# Patient Record
Sex: Female | Born: 1985 | Race: White | Hispanic: No | Marital: Married
Health system: Midwestern US, Community
[De-identification: ages and names within clinical notes are randomized; demographics above are authoritative.]

## PROBLEM LIST (undated history)

## (undated) DIAGNOSIS — N83209 Unspecified ovarian cyst, unspecified side: Secondary | ICD-10-CM

## (undated) DIAGNOSIS — M797 Fibromyalgia: Secondary | ICD-10-CM

## (undated) DIAGNOSIS — T753XXA Motion sickness, initial encounter: Secondary | ICD-10-CM

## (undated) DIAGNOSIS — K562 Volvulus: Secondary | ICD-10-CM

## (undated) DIAGNOSIS — K56609 Unspecified intestinal obstruction, unspecified as to partial versus complete obstruction: Secondary | ICD-10-CM

## (undated) DIAGNOSIS — K589 Irritable bowel syndrome without diarrhea: Secondary | ICD-10-CM

## (undated) DIAGNOSIS — G629 Polyneuropathy, unspecified: Secondary | ICD-10-CM

## (undated) DIAGNOSIS — R011 Cardiac murmur, unspecified: Secondary | ICD-10-CM

## (undated) DIAGNOSIS — F329 Major depressive disorder, single episode, unspecified: Secondary | ICD-10-CM

## (undated) DIAGNOSIS — F32A Depression, unspecified: Secondary | ICD-10-CM

## (undated) DIAGNOSIS — N200 Calculus of kidney: Secondary | ICD-10-CM

## (undated) DIAGNOSIS — G43909 Migraine, unspecified, not intractable, without status migrainosus: Secondary | ICD-10-CM

## (undated) DIAGNOSIS — K8020 Calculus of gallbladder without cholecystitis without obstruction: Secondary | ICD-10-CM

## (undated) DIAGNOSIS — F419 Anxiety disorder, unspecified: Secondary | ICD-10-CM

## (undated) HISTORY — DX: Anxiety disorder, unspecified: F41.9

## (undated) HISTORY — DX: Irritable bowel syndrome without diarrhea: K58.9

## (undated) HISTORY — DX: Polyneuropathy, unspecified: G62.9

## (undated) HISTORY — PX: SMALL INTESTINE SURGERY: SHX150

## (undated) HISTORY — DX: Fibromyalgia: M79.7

## (undated) HISTORY — DX: Calculus of gallbladder without cholecystitis without obstruction: K80.20

## (undated) HISTORY — DX: Migraine, unspecified, not intractable, without status migrainosus: G43.909

## (undated) HISTORY — DX: Depression, unspecified: F32.A

## (undated) HISTORY — DX: Volvulus: K56.2

## (undated) HISTORY — PX: OVARIAN CYST SURGERY: SHX726

## (undated) HISTORY — PX: KIDNEY STONE SURGERY: SHX686

## (undated) HISTORY — DX: Major depressive disorder, single episode, unspecified: F32.9

## (undated) HISTORY — PX: ABDOMINAL SURGERY: SHX537

---

## 2005-09-01 ENCOUNTER — Observation Stay: Payer: Self-pay | Admitting: Urology

## 2008-09-06 ENCOUNTER — Emergency Department: Payer: Self-pay | Admitting: Emergency Medicine

## 2008-09-24 DIAGNOSIS — N83209 Unspecified ovarian cyst, unspecified side: Secondary | ICD-10-CM

## 2008-09-24 HISTORY — DX: Unspecified ovarian cyst, unspecified side: N83.209

## 2008-12-29 ENCOUNTER — Ambulatory Visit: Payer: Self-pay | Admitting: Internal Medicine

## 2009-09-12 ENCOUNTER — Emergency Department: Payer: Self-pay | Admitting: Emergency Medicine

## 2012-03-01 ENCOUNTER — Ambulatory Visit: Payer: Self-pay | Admitting: Medical

## 2016-03-29 ENCOUNTER — Emergency Department (HOSPITAL_COMMUNITY): Payer: Medicaid Other

## 2016-03-29 ENCOUNTER — Encounter (HOSPITAL_COMMUNITY): Payer: Self-pay | Admitting: Emergency Medicine

## 2016-03-29 ENCOUNTER — Emergency Department (HOSPITAL_COMMUNITY)
Admission: EM | Admit: 2016-03-29 | Discharge: 2016-03-29 | Disposition: A | Discharge status: Home / Self Care | Payer: Medicaid Other | Attending: Emergency Medicine | Admitting: Emergency Medicine

## 2016-03-29 DIAGNOSIS — R109 Unspecified abdominal pain: Secondary | ICD-10-CM | POA: Diagnosis present

## 2016-03-29 DIAGNOSIS — R112 Nausea with vomiting, unspecified: Secondary | ICD-10-CM | POA: Diagnosis not present

## 2016-03-29 DIAGNOSIS — R1084 Generalized abdominal pain: Secondary | ICD-10-CM

## 2016-03-29 HISTORY — DX: Unspecified intestinal obstruction, unspecified as to partial versus complete obstruction: K56.609

## 2016-03-29 HISTORY — DX: Unspecified ovarian cyst, unspecified side: N83.209

## 2016-03-29 HISTORY — DX: Calculus of kidney: N20.0

## 2016-03-29 HISTORY — DX: Cardiac murmur, unspecified: R01.1

## 2016-03-29 LAB — URINALYSIS, ROUTINE W REFLEX MICROSCOPIC
BILIRUBIN URINE: NEGATIVE
Glucose, UA: NEGATIVE mg/dL
Ketones, ur: NEGATIVE mg/dL
LEUKOCYTES UA: NEGATIVE
NITRITE: NEGATIVE
PH: 6 (ref 5.0–8.0)
Protein, ur: NEGATIVE mg/dL
SPECIFIC GRAVITY, URINE: 1.02 (ref 1.005–1.030)

## 2016-03-29 LAB — BASIC METABOLIC PANEL
ANION GAP: 10 (ref 5–15)
BUN: 5 mg/dL — ABNORMAL LOW (ref 6–20)
CALCIUM: 9 mg/dL (ref 8.9–10.3)
CO2: 24 mmol/L (ref 22–32)
Chloride: 102 mmol/L (ref 101–111)
Creatinine, Ser: 0.56 mg/dL (ref 0.44–1.00)
GFR calc Af Amer: >60 mL/min (ref >60)
Glucose, Bld: 114 mg/dL — ABNORMAL HIGH (ref 65–99)
POTASSIUM: 3.4 mmol/L — AB (ref 3.5–5.1)
SODIUM: 136 mmol/L (ref 135–145)

## 2016-03-29 LAB — CBC WITH DIFFERENTIAL/PLATELET
BASOS ABS: 0 10*3/uL (ref 0.0–0.1)
BASOS PCT: 0 %
EOS PCT: 2 %
Eosinophils Absolute: 0.1 10*3/uL (ref 0.0–0.7)
HEMATOCRIT: 37 % (ref 36.0–46.0)
Hemoglobin: 12.6 g/dL (ref 12.0–15.0)
LYMPHS PCT: 32 %
Lymphs Abs: 2.2 10*3/uL (ref 0.7–4.0)
MCH: 29.6 pg (ref 26.0–34.0)
MCHC: 34.1 g/dL (ref 30.0–36.0)
MCV: 86.9 fL (ref 78.0–100.0)
Monocytes Absolute: 0.4 10*3/uL (ref 0.1–1.0)
Monocytes Relative: 5 %
NEUTROS ABS: 4.2 10*3/uL (ref 1.7–7.7)
Neutrophils Relative %: 61 %
PLATELETS: 289 10*3/uL (ref 150–400)
RBC: 4.26 MIL/uL (ref 3.87–5.11)
RDW: 12.9 % (ref 11.5–15.5)
WBC: 6.9 10*3/uL (ref 4.0–10.5)

## 2016-03-29 LAB — PREGNANCY, URINE: Preg Test, Ur: NEGATIVE

## 2016-03-29 LAB — URINE MICROSCOPIC-ADD ON

## 2016-03-29 MED ORDER — SODIUM CHLORIDE 0.9 % IV BOLUS (SEPSIS)
1000.0000 mL | Freq: Once | INTRAVENOUS | Status: AC
  Administered 2016-03-29: 1000 mL via INTRAVENOUS

## 2016-03-29 MED ORDER — PROMETHAZINE HCL 25 MG RE SUPP: 25.0000 mg | Freq: Four times a day (QID) | RECTAL | Status: DC | PRN

## 2016-03-29 MED ORDER — PROMETHAZINE HCL 25 MG/ML IJ SOLN
12.5000 mg | Freq: Once | INTRAMUSCULAR | Status: AC
  Administered 2016-03-29: 12.5 mg via INTRAVENOUS
  Filled 2016-03-29: qty 1

## 2016-03-29 MED ORDER — PROMETHAZINE HCL 25 MG PO TABS: 25.0000 mg | ORAL_TABLET | Freq: Four times a day (QID) | ORAL | Status: DC | PRN

## 2016-03-29 MED ORDER — ONDANSETRON HCL 4 MG/2ML IJ SOLN
4.0000 mg | Freq: Once | INTRAMUSCULAR | Status: AC
  Administered 2016-03-29: 4 mg via INTRAVENOUS
  Filled 2016-03-29: qty 2

## 2016-03-29 MED ORDER — HYDROMORPHONE HCL 1 MG/ML IJ SOLN
1.0000 mg | Freq: Once | INTRAMUSCULAR | Status: AC
  Administered 2016-03-29: 1 mg via INTRAVENOUS
  Filled 2016-03-29: qty 1

## 2016-03-29 MED ORDER — DICYCLOMINE HCL 20 MG PO TABS: 20.0000 mg | ORAL_TABLET | Freq: Two times a day (BID) | ORAL | Status: DC

## 2016-03-29 NOTE — ED Notes (Signed)
Patient arrives with complaint of right flank pain. States onset over weekend. Was seen in [place] for the same, and was told she likely had kidney stones. Today pain has been very intense. Patient doubled over crying out in pain in triage. Endorses history of SBO with surgical intervention x2 and ovarian cysts in addition to Kidney stone history.

## 2016-03-29 NOTE — ED Notes (Signed)
Pt. Vomiting. EDP made aware.

## 2016-03-29 NOTE — ED Provider Notes (Signed)
CSN: [id number]     Arrival date & time 03/29/16  1859 History   First MD Initiated Contact with Patient 03/29/16 1940     Chief Complaint  Patient presents with  . Flank Pain   Pt is a 30 yo female with a hx of chronic abdominal pain who presents to the ED today with right sided flank pain.  The pt normally goes to the [facility] ED and was seen there on July 3rd.  She had some blood in her urine so it was a presumed kidney stone.  The pt has had many CT scans and other imaging, so that is probably the reason it was not explored further.  Pt's last CT scan was in June and showed moderate amt of stool only.  Pt reports severe right sided flank pain that has been continuing since she was seen in the ED in [place].    (Consider location/radiation/quality/duration/timing/severity/associated sxs/prior Treatment) Patient is a 30 y.o. female presenting with flank pain. The history is provided by the patient.  Flank Pain This is a recurrent problem. The current episode started more than 2 days ago. The problem occurs constantly. The problem has been rapidly worsening. Associated symptoms include abdominal pain. Nothing aggravates the symptoms. Nothing relieves the symptoms.    Past Medical History  Diagnosis Date  . SBO (small bowel obstruction) (HCC)   . Ovarian cyst   . Kidney stones   . Heart murmur    Past Surgical History  Procedure Laterality Date  . Abdominal surgery     History reviewed. No pertinent family history. Social History  Substance Use Topics  . Smoking status: Never Smoker   . Smokeless tobacco: None  . Alcohol Use: No   OB History    No data available     Review of Systems  Gastrointestinal: Positive for abdominal pain.  Genitourinary: Positive for flank pain.  All other systems reviewed and are negative.     Allergies  Morphine and related  Home Medications   Prior to Admission medications   Medication Sig Start Date End Date Taking? Authorizing  Provider  Cholecalciferol (VITAMIN D PO) Take 1 tablet by mouth daily.   Yes Historical Provider, MD  DULoxetine (CYMBALTA) 60 MG capsule Take 60 mg by mouth daily.   Yes Historical Provider, MD  HYDROcodone-acetaminophen (NORCO/VICODIN) 5-325 MG tablet Take 1 tablet by mouth every 6 (six) hours as needed for moderate pain.   Yes Historical Provider, MD  lubiprostone (AMITIZA) 24 MCG capsule Take 24 mcg by mouth 2 (two) times daily with a meal.   Yes Historical Provider, MD  norgestimate-ethinyl estradiol (ORTHO-CYCLEN,SPRINTEC,PREVIFEM) 0.25-35 MG-MCG tablet Take 1 tablet by mouth daily.   Yes Historical Provider, MD  omeprazole (PRILOSEC) 20 MG capsule Take 20 mg by mouth daily.   Yes Historical Provider, MD  polyethylene glycol (MIRALAX / GLYCOLAX) packet Take 17 g by mouth daily as needed for mild constipation.   Yes Historical Provider, MD  tamsulosin (FLOMAX) 0.4 MG CAPS capsule Take 0.4 mg by mouth daily. Take for 7 days   Yes Historical Provider, MD  dicyclomine (BENTYL) 20 MG tablet Take 1 tablet (20 mg total) by mouth 2 (two) times daily. 03/29/16   Jacalyn LefevreJulie Leeandra Ellerson, MD  promethazine (PHENERGAN) 25 MG suppository Place 1 suppository (25 mg total) rectally every 6 (six) hours as needed for nausea or vomiting. 03/29/16   Jacalyn LefevreJulie Kailin Leu, MD  promethazine (PHENERGAN) 25 MG tablet Take 1 tablet (25 mg total) by mouth every 6 (  six) hours as needed for nausea or vomiting. 03/29/16   Jacalyn LefevreJulie Passion Lavin, MD   BP 105/75 mmHg  Pulse 84  Temp(Src) 98.2 F (36.8 C) (Oral)  Resp 13  Ht 4\' 11"  (1.499 m)  Wt 93 lb (42.185 kg)  BMI 18.77 kg/m2  SpO2 100%  LMP 03/08/2016 (Exact Date) Physical Exam  Constitutional: She is oriented to person, place, and time. She appears well-developed and well-nourished. She appears distressed.  HENT:  Head: Normocephalic and atraumatic.  Right Ear: External ear normal.  Left Ear: External ear normal.  Mouth/Throat: Oropharynx is clear and moist.  Eyes: Conjunctivae and EOM  are normal. Pupils are equal, round, and reactive to light.  Neck: Normal range of motion. Neck supple.  Cardiovascular: Regular rhythm, normal heart sounds and intact distal pulses.  Tachycardia present.   Pulmonary/Chest: Effort normal and breath sounds normal.  Abdominal: Soft. Bowel sounds are normal. There is tenderness in the right lower quadrant.  Musculoskeletal: Normal range of motion.  Neurological: She is alert and oriented to person, place, and time.  Skin: Skin is warm and dry.  Psychiatric: Her behavior is normal. Judgment and thought content normal. Her mood appears anxious.  Nursing note and vitals reviewed.   ED Course  Procedures (including critical care time) Labs Review Labs Reviewed  URINALYSIS, ROUTINE W REFLEX MICROSCOPIC (NOT AT [facility]) - Abnormal; Notable for the following:    APPearance CLOUDY (*)    Hgb urine dipstick MODERATE (*)    All other components within normal limits  BASIC METABOLIC PANEL - Abnormal; Notable for the following:    Potassium 3.4 (*)    Glucose, Bld 114 (*)    BUN 5 (*)    All other components within normal limits  URINE MICROSCOPIC-ADD ON - Abnormal; Notable for the following:    Squamous Epithelial / LPF 0-5 (*)    Bacteria, UA MANY (*)    All other components within normal limits  CBC WITH DIFFERENTIAL/PLATELET  PREGNANCY, URINE    Imaging Review Ct Renal Stone Study  03/29/2016  CLINICAL DATA:  Right flank pain.  Started over the weekend. EXAM: CT ABDOMEN AND PELVIS WITHOUT CONTRAST TECHNIQUE: Multidetector CT imaging of the abdomen and pelvis was performed following the standard protocol without IV contrast. COMPARISON:  None. FINDINGS: Lower chest:  No acute findings. Hepatobiliary: No mass visualized on this un-enhanced exam. Pancreas: No mass or inflammatory process identified on this un-enhanced exam. Spleen: Within normal limits in size. Adrenals/Urinary Tract: No evidence of urolithiasis or hydronephrosis. 12 mm hypodense,  fluid attenuating left upper pole renal mass most consistent with a cyst. No definite solid mass visualized on this un-enhanced exam. Stomach/Bowel: No evidence of obstruction, inflammatory process, or abnormal fluid collections. Vascular/Lymphatic: No pathologically enlarged lymph nodes. No evidence of abdominal aortic aneurysm. Reproductive: No mass or other significant abnormality. Other: None. Musculoskeletal:  No suspicious bone lesions identified. IMPRESSION: 1. No urolithiasis or obstructive uropathy. 2. No bowel obstruction. Electronically Signed   By: Elige KoHetal  Patel   On: 03/29/2016 21:33   I have personally reviewed and evaluated these images and lab results as part of my medical decision-making.   EKG Interpretation None      MDM  Pt is feeling much better.  She is given a rx for phenergan oral and supp.  She is given the number to GI.  She knows to return if worse. Final diagnoses:  Generalized abdominal pain  Non-intractable vomiting with nausea, vomiting of unspecified type  Jacalyn Lefevre, MD 03/29/16 2155

## 2016-03-29 NOTE — Discharge Instructions (Signed)

## 2016-03-29 NOTE — ED Notes (Signed)
Pt taken back to triage, severe pain

## 2016-04-02 ENCOUNTER — Emergency Department (HOSPITAL_COMMUNITY): Payer: Medicaid Other

## 2016-04-02 ENCOUNTER — Emergency Department (HOSPITAL_COMMUNITY)
Admission: EM | Admit: 2016-04-02 | Discharge: 2016-04-02 | Disposition: A | Discharge status: Home / Self Care | Payer: Medicaid Other | Attending: Physician Assistant | Admitting: Physician Assistant

## 2016-04-02 ENCOUNTER — Encounter (HOSPITAL_COMMUNITY): Payer: Self-pay | Admitting: Family Medicine

## 2016-04-02 DIAGNOSIS — Z79899 Other long term (current) drug therapy: Secondary | ICD-10-CM | POA: Diagnosis not present

## 2016-04-02 DIAGNOSIS — R109 Unspecified abdominal pain: Secondary | ICD-10-CM | POA: Diagnosis not present

## 2016-04-02 DIAGNOSIS — R102 Pelvic and perineal pain: Secondary | ICD-10-CM | POA: Diagnosis not present

## 2016-04-02 DIAGNOSIS — M549 Dorsalgia, unspecified: Secondary | ICD-10-CM | POA: Insufficient documentation

## 2016-04-02 DIAGNOSIS — R519 Headache, unspecified: Secondary | ICD-10-CM

## 2016-04-02 DIAGNOSIS — R51 Headache: Secondary | ICD-10-CM | POA: Diagnosis not present

## 2016-04-02 LAB — URINE MICROSCOPIC-ADD ON

## 2016-04-02 LAB — CBC
HCT: 40.4 % (ref 36.0–46.0)
Hemoglobin: 13.5 g/dL (ref 12.0–15.0)
MCH: 29.6 pg (ref 26.0–34.0)
MCHC: 33.4 g/dL (ref 30.0–36.0)
MCV: 88.6 fL (ref 78.0–100.0)
PLATELETS: 327 10*3/uL (ref 150–400)
RBC: 4.56 MIL/uL (ref 3.87–5.11)
RDW: 12.9 % (ref 11.5–15.5)
WBC: 7.6 10*3/uL (ref 4.0–10.5)

## 2016-04-02 LAB — COMPREHENSIVE METABOLIC PANEL
ALK PHOS: 37 U/L — AB (ref 38–126)
ALT: 12 U/L — AB (ref 14–54)
AST: 19 U/L (ref 15–41)
Albumin: 3.9 g/dL (ref 3.5–5.0)
Anion gap: 7 (ref 5–15)
BUN: 9 mg/dL (ref 6–20)
CALCIUM: 9.4 mg/dL (ref 8.9–10.3)
CHLORIDE: 104 mmol/L (ref 101–111)
CO2: 27 mmol/L (ref 22–32)
CREATININE: 0.64 mg/dL (ref 0.44–1.00)
GFR calc Af Amer: >60 mL/min (ref >60)
Glucose, Bld: 114 mg/dL — ABNORMAL HIGH (ref 65–99)
Potassium: 3.7 mmol/L (ref 3.5–5.1)
Sodium: 138 mmol/L (ref 135–145)
Total Bilirubin: 0.6 mg/dL (ref 0.3–1.2)
Total Protein: 7.4 g/dL (ref 6.5–8.1)

## 2016-04-02 LAB — URINALYSIS, ROUTINE W REFLEX MICROSCOPIC
Bilirubin Urine: NEGATIVE
GLUCOSE, UA: NEGATIVE mg/dL
KETONES UR: 15 mg/dL — AB
Leukocytes, UA: NEGATIVE
Nitrite: NEGATIVE
PROTEIN: NEGATIVE mg/dL
Specific Gravity, Urine: 1.022 (ref 1.005–1.030)
pH: 5.5 (ref 5.0–8.0)

## 2016-04-02 LAB — I-STAT BETA HCG BLOOD, ED (MC, WL, AP ONLY): I-stat hCG, quantitative: <5 m[IU]/mL (ref <5)

## 2016-04-02 LAB — LIPASE, BLOOD: LIPASE: 39 U/L (ref 11–51)

## 2016-04-02 MED ORDER — SODIUM CHLORIDE 0.9 % IV BOLUS (SEPSIS)
1000.0000 mL | Freq: Once | INTRAVENOUS | Status: AC
  Administered 2016-04-02: 1000 mL via INTRAVENOUS

## 2016-04-02 MED ORDER — PROCHLORPERAZINE EDISYLATE 5 MG/ML IJ SOLN
10.0000 mg | Freq: Once | INTRAMUSCULAR | Status: AC
  Administered 2016-04-02: 10 mg via INTRAVENOUS
  Filled 2016-04-02: qty 2

## 2016-04-02 MED ORDER — FENTANYL CITRATE (PF) 100 MCG/2ML IJ SOLN
INTRAMUSCULAR | Status: AC
  Filled 2016-04-02: qty 2

## 2016-04-02 MED ORDER — DIPHENHYDRAMINE HCL 50 MG/ML IJ SOLN
25.0000 mg | Freq: Once | INTRAMUSCULAR | Status: AC
  Administered 2016-04-02: 25 mg via INTRAVENOUS
  Filled 2016-04-02: qty 1

## 2016-04-02 MED ORDER — ONDANSETRON 4 MG PO TBDP: 4.0000 mg | ORAL_TABLET | Freq: Once | ORAL | Status: DC | PRN

## 2016-04-02 MED ORDER — ONDANSETRON HCL 4 MG/2ML IJ SOLN
4.0000 mg | Freq: Once | INTRAMUSCULAR | Status: AC
  Administered 2016-04-02: 4 mg via INTRAVENOUS

## 2016-04-02 MED ORDER — ONDANSETRON HCL 4 MG/2ML IJ SOLN
INTRAMUSCULAR | Status: AC
  Filled 2016-04-02: qty 2

## 2016-04-02 MED ORDER — FENTANYL CITRATE (PF) 100 MCG/2ML IJ SOLN
50.0000 ug | INTRAMUSCULAR | Status: DC | PRN
  Administered 2016-04-02: 50 ug via INTRAVENOUS

## 2016-04-02 NOTE — ED Provider Notes (Signed)
CSN: [id number]     Arrival date & time 04/02/16  1649 History   First MD Initiated Contact with Patient 04/02/16 1849     Chief Complaint  Patient presents with  . Headache  . Back Pain  . Pelvic Pain     (Consider location/radiation/quality/duration/timing/severity/associated sxs/prior Treatment) HPI   Patient is a 30 year old female with history of chronic abdominal pain who is received multiple CTs at [facility] ED. She has been seen recently for right flank pain by her primary care physician and in the emergency department.  Radiology has been found. Patient reports today she is having the same pain. Additionally she's been having daily headaches she says for the "the last month". No neurologic complaints.  Patient has been eating and drinking normally. She's been urinating normally. No fevers No pain with urination. She says she passed the stone that she thought was causing the pain couple days ago. She still has more treatment for the kidney stone at home. This pain has been ongoing for several months.  Past Medical History  Diagnosis Date  . SBO (small bowel obstruction) (HCC)   . Ovarian cyst   . Kidney stones   . Heart murmur    Past Surgical History  Procedure Laterality Date  . Abdominal surgery     History reviewed. No pertinent family history. Social History  Substance Use Topics  . Smoking status: Never Smoker   . Smokeless tobacco: None  . Alcohol Use: No   OB History    No data available     Review of Systems  Constitutional: Positive for appetite change and fatigue. Negative for activity change.  HENT: Negative for congestion and drooling.   Eyes: Negative for discharge.  Respiratory: Negative for cough and chest tightness.   Cardiovascular: Negative for chest pain.  Gastrointestinal: Negative for abdominal distention.  Genitourinary: Positive for flank pain. Negative for dysuria and difficulty urinating.  Musculoskeletal: Negative for joint swelling.   Skin: Negative for rash.  Allergic/Immunologic: Negative for immunocompromised state.  Neurological: Negative for seizures, syncope, speech difficulty and weakness.  Psychiatric/Behavioral: Negative for behavioral problems and agitation.  All other systems reviewed and are negative.     Allergies  Morphine and related  Home Medications   Prior to Admission medications   Medication Sig Start Date End Date Taking? Authorizing Provider  Cholecalciferol (VITAMIN D PO) Take 1 tablet by mouth daily.    Historical Provider, MD  dicyclomine (BENTYL) 20 MG tablet Take 1 tablet (20 mg total) by mouth 2 (two) times daily. 03/29/16   Jacalyn Lefevre, MD  DULoxetine (CYMBALTA) 60 MG capsule Take 60 mg by mouth daily.    Historical Provider, MD  HYDROcodone-acetaminophen (NORCO/VICODIN) 5-325 MG tablet Take 1 tablet by mouth every 6 (six) hours as needed for moderate pain.    Historical Provider, MD  lubiprostone (AMITIZA) 24 MCG capsule Take 24 mcg by mouth 2 (two) times daily with a meal.    Historical Provider, MD  norgestimate-ethinyl estradiol (ORTHO-CYCLEN,SPRINTEC,PREVIFEM) 0.25-35 MG-MCG tablet Take 1 tablet by mouth daily.    Historical Provider, MD  omeprazole (PRILOSEC) 20 MG capsule Take 20 mg by mouth daily.    Historical Provider, MD  polyethylene glycol (MIRALAX / GLYCOLAX) packet Take 17 g by mouth daily as needed for mild constipation.    Historical Provider, MD  promethazine (PHENERGAN) 25 MG suppository Place 1 suppository (25 mg total) rectally every 6 (six) hours as needed for nausea or vomiting. 03/29/16   Jacalyn Lefevre, MD  promethazine (PHENERGAN) 25 MG tablet Take 1 tablet (25 mg total) by mouth every 6 (six) hours as needed for nausea or vomiting. 03/29/16   Jacalyn LefevreJulie Haviland, MD  tamsulosin (FLOMAX) 0.4 MG CAPS capsule Take 0.4 mg by mouth daily. Take for 7 days    Historical Provider, MD   BP 107/58 mmHg  Pulse 112  Temp(Src) 98.3 F (36.8 C) (Oral)  Resp 18  Ht 4\' 11"  (1.499  m)  Wt 93 lb (42.185 kg)  BMI 18.77 kg/m2  SpO2 100%  LMP 03/08/2016 (Exact Date) Physical Exam  Constitutional: She is oriented to person, place, and time. She appears well-developed and well-nourished.  HENT:  Head: Normocephalic and atraumatic.  Eyes: Conjunctivae are normal. Right eye exhibits no discharge.  Neck: Neck supple.  Cardiovascular: Normal rate, regular rhythm and normal heart sounds.   No murmur heard. Pulmonary/Chest: Effort normal and breath sounds normal. She has no wheezes. She has no rales.  Tenderness along right chest wall, right flank.  Abdominal: Soft. She exhibits no distension. There is no tenderness.  Musculoskeletal: Normal range of motion. She exhibits no edema.  Neurological: She is oriented to person, place, and time. No cranial nerve deficit.  Skin: Skin is warm and dry. No rash noted. She is not diaphoretic.  Psychiatric: She has a normal mood and affect. Her behavior is normal.  Nursing note and vitals reviewed.   ED Course  Procedures (including critical care time) Labs Review Labs Reviewed  COMPREHENSIVE METABOLIC PANEL - Abnormal; Notable for the following:    Glucose, Bld 114 (*)    ALT 12 (*)    Alkaline Phosphatase 37 (*)    All other components within normal limits  URINALYSIS, ROUTINE W REFLEX MICROSCOPIC (NOT AT [facility]) - Abnormal; Notable for the following:    APPearance CLOUDY (*)    Hgb urine dipstick MODERATE (*)    Ketones, ur 15 (*)    All other components within normal limits  URINE MICROSCOPIC-ADD ON - Abnormal; Notable for the following:    Squamous Epithelial / LPF 6-30 (*)    Bacteria, UA MANY (*)    Crystals CA OXALATE CRYSTALS (*)    All other components within normal limits  URINE CULTURE  LIPASE, BLOOD  CBC  I-STAT BETA HCG BLOOD, ED ([facility], [facility], [facility] ONLY)    Imaging Review Ct Head Wo Contrast  04/02/2016  CLINICAL DATA:  30 year old female with headache and nausea. EXAM: CT HEAD WITHOUT CONTRAST TECHNIQUE:  Contiguous axial images were obtained from the base of the skull through the vertex without intravenous contrast. COMPARISON:  None. FINDINGS: The ventricles and sulci are appropriate in size for the patient's age. There is no intracranial hemorrhage. No mass effect or midline shift identified. The gray-white matter differentiation is preserved. There is no extra-axial fluid collection. The visualized paranasal sinuses and mastoid air cells are well aerated. The calvarium is intact. IMPRESSION: No acute intracranial pathology. Electronically Signed   By: Elgie CollardArash  Radparvar M.D.   On: 04/02/2016 20:33   I have personally reviewed and evaluated these images and lab results as part of my medical decision-making.   EKG Interpretation None      MDM   Final diagnoses:  None    Patient is a 30 year old female percent with history of chronic abdominal pain and chronic pain, presenting today with her chronic right flank pain. He's patient has been seen multiple times for this in the past. Multiple [facility] have been completed. Patient was  diagnosed with kidney stone. She says she feels that she passed it, but is still having some pain. This is not different than her usual chronic pain. She is eating and drinking normally, normal vital signs. I don't believe there is any further workup that needs to be done at this time considering normal labs and vital signs and chronic nature of the pain.  She also complains of given headaches. We will get a CAT scan and treat as a migraine.  9:08 PM Patient feels improved. We'll discharge and have her follow-up with PCP this week.  Cai Anfinson Randall An, MD 04/02/16 2108

## 2016-04-02 NOTE — ED Notes (Signed)
Patient transported to CT 

## 2016-04-02 NOTE — ED Notes (Signed)
Dr. Mackuen at bedside  

## 2016-04-02 NOTE — ED Notes (Signed)
Pt here for back pain,right flank pain, abd pain, headache and groin pain. sts N,V.

## 2016-04-02 NOTE — Discharge Instructions (Signed)

## 2016-04-04 LAB — URINE CULTURE: CULTURE: NO GROWTH

## 2016-06-26 ENCOUNTER — Ambulatory Visit: Payer: Medicaid Other | Admitting: Physical Therapy

## 2016-07-06 ENCOUNTER — Encounter: Payer: Self-pay | Admitting: Physical Therapy

## 2016-07-06 ENCOUNTER — Ambulatory Visit: Payer: Medicaid Other | Attending: Family Medicine | Admitting: Physical Therapy

## 2016-07-06 DIAGNOSIS — R293 Abnormal posture: Secondary | ICD-10-CM | POA: Insufficient documentation

## 2016-07-06 DIAGNOSIS — R2689 Other abnormalities of gait and mobility: Secondary | ICD-10-CM | POA: Insufficient documentation

## 2016-07-06 NOTE — Therapy (Addendum)
[facility] MAIN [facility] SERVICES [address] [place], [place], [address] Phone: [phone]   Fax:  [phone]  Physical Therapy Evaluation/ Discharge Summary   Patient Details  Name: Charlene Ferguson MRN: [id number] Date of Birth: April 07, 1986 Referring Provider: Lucienne Minks   Encounter Date: 07/06/2016      PT End of Session - 07/30/16 1714    Visit Number 1   Number of Visits 1   Date for PT Re-Evaluation 07/09/16   Authorization Type Medicaid is limited to one visit for coverage   PT Start Time 1100   PT Stop Time 1215   PT Time Calculation (min) 75 min   Activity Tolerance Patient tolerated treatment well;No increased pain   Behavior During Therapy WFL for tasks assessed/performed      Past Medical History:  Diagnosis Date  . Heart murmur   . Kidney stones   . Ovarian cyst 2010   bilateral side. R cyst has been removed 2016  . SBO (small bowel obstruction)    2010  . 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 ABDOMINAL SURGERY    . SMALL INTESTINE SURGERY     at birth to remove volvulus    There were no vitals filed for this visit.       Subjective Assessment - 07/06/16 1712    Subjective 1) Pt reports pelvic pain R side (7/10), low back pain with radiating numbness to R anterior thigh to above knee level (1/10) , abdominal pain (5/10) .  2) pt also reports constipation, incomplete bowel movements, and  diarrhea. Pt  reports she can differentiate between constipation pain versus scar ahdesion pain.     Pertinent History Hx of volvulus with 4 in of small intestines removed at birth, R ovarian cyst removed, small bowel obstruction.    Patient Stated Goals stop pain and decrease constipation            [facility] PT Assessment - 07/06/16 1710      Assessment   Medical Diagnosis pelvic pain   Referring Provider Dasouki       Precautions   Precautions None     Restrictions   Weight Bearing Restrictions No     Balance Screen   Has the patient fallen in the past 6 months No     Observation/Other Assessments   Observations slumped sitting     Sensation   Light Touch --  preTx: decreased on L1 dermation R, postTx: increased      Coordination   Gross Motor Movements are Fluid and Coordinated --  chest breathing,    Fine Motor Movements are Fluid and Coordinated --  limited diaphragmatic excursion w/ back mm tensions     Posture/Postural Control   Posture Comments lumbopelvic perturbations with leg movements     AROM   Overall AROM Comments spinal WFL, R thigh pain with R/ L rotation      Palpation   Palpation comment significantly restricted abdominal scars over all quadrants   reproduced pain w/ palpation along R medial ASIS                            PT Education - 07/06/16 1714    Education provided Yes   Education Details POC, anatomy,physiology, goals, HEP,    Person(s) Educated Patient  Methods Explanation;Demonstration;Tactile cues;Verbal cues;Handout   Comprehension Returned demonstration;Verbalized understanding          PT Short Term Goals - 07/06/16 1721      PT SHORT TERM GOAL #1   Title Pt will show IND with HEP   Time 1   Period Days   Status Achieved     PT SHORT TERM GOAL #2   Title Pt will demo decreased tenderness with palpation over areas of the abdomina scar in order to promote motility and  bowel movements   Time 1   Period Days   Status Achieved     PT SHORT TERM GOAL #3   Title Pt will demo increased diaphragmatic excursion and pelvic floor lengthening in order to promote improved toileting mechanics    Time 1   Period Weeks   Status Achieved                  Plan - 07/06/16 1715    Clinical Impression Statement Pt is a 30 yo female who c/o pelvic pain and constipation which impact her QOL. Pt's clinical presentation  include significantly increased scar restrictions over her abdomen, slumped posture, increased thoracic mm tensions, and dyscoordination of deep core mm.  Following session, pt demo'd proper coordination of the deep core, IND with scar massage, and IND with HEP for decreasing thoracic mm tensions and improving diaphragmatic excursion to improve pelvic floor lengthening. Pt also demo'd equal sensation along L1 dermatome over anterior thigh following session. Pt reported feeling improved pelvic pain. Suspect pt will continue to improve her pelvic pain and constipation as her scar adhesions decrease and her coordination of pelvic floor mm improves. Pt was provided resources to continue progressing her HEP because pt is unable to afford future visits out of pocket. Pt voiced understanding and appreciation. Pt is ready for d/c.    Rehab Potential Good   PT Frequency One time visit   PT Treatment/Interventions Functional mobility training;Therapeutic activities;Therapeutic exercise;Patient/family education;Neuromuscular re-education;Manual techniques;Manual lymph drainage;Scar mobilization   Consulted and Agree with Plan of Care Patient      Patient will benefit from skilled therapeutic intervention in order to improve the following deficits and impairments:  Pain, Improper body mechanics, Postural dysfunction, Increased muscle spasms, Decreased scar mobility, Decreased mobility, Decreased coordination, Decreased strength, Hypomobility, Decreased range of motion, Decreased endurance  Visit Diagnosis: Other abnormalities of gait and mobility  Abnormal posture     Problem List There are no active problems to display for this patient.   Mariane MastersYeung,Shin Yiing ,PT, DPT, E-RYT  07/30/2016, 5:23 PM  [facility] [address] [place], [place], [address] Phone: [phone]   Fax:  [phone]  Name: Charlene Ferguson MRN: [id number] Date of Birth:  August 01, 1986

## 2016-07-06 NOTE — Patient Instructions (Signed)
Open book (handout)  Before abdominal massage (handout) include deep core level 2  Scar massage: (handout) plus instructions for colon/lymph massage 5 small semi circle strokes along the path of the colon x 5 sets  Breathing   You are now ready to begin training the deep core muscles system: diaphragm, transverse abdominis, pelvic floor . These muscles must work together as a team.           The key to these exercises to train the brain to coordinate the timing of these muscles and to have them turn on for long periods of time to hold you upright against gravity (especially important if you are on your feet all day).These muscles are postural muscles and play a role stabilizing your spine and bodyweight. By doing these repetitions slowly and correctly instead of doing crunches, you will achieve a flatter belly without a lower pooch. You are also placing your spine in a more neutral position and breathing properly which in turn, decreases your risk for problems related to your pelvic floor, abdominal, and low back such as pelvic organ prolapse, hernias, diastasis recti (separation of superficial muscles), disk herniations, spinal fractures. These exercises set a solid foundation for you to later progress to resistance/ strength training with therabands and weights and return to other typical fitness exercises with a stronger deeper core.   Do level 1 : 10 reps Do level 2: 10 reps (left and right = 1 rep) x 3 sets , 2 x day Do not progress to level 3 for 3-4 weeks. You know you are ready when you do not have any rocking of pelvis nor arching in your back

## 2016-08-01 NOTE — Addendum Note (Signed)
Addended by: Mariane MastersYEUNG, SHIN-YIING on: 08/01/2016 01:59 PM   Modules accepted: Orders

## 2016-10-22 ENCOUNTER — Other Ambulatory Visit: Payer: Self-pay | Admitting: Family Medicine

## 2016-10-22 ENCOUNTER — Ambulatory Visit
Admission: RE | Admit: 2016-10-22 | Discharge: 2016-10-22 | Disposition: A | Discharge status: Home / Self Care | Payer: Medicaid Other | Source: Ambulatory Visit | Attending: Urology | Admitting: Urology

## 2016-10-22 ENCOUNTER — Other Ambulatory Visit
Admission: RE | Admit: 2016-10-22 | Discharge: 2016-10-22 | Disposition: A | Discharge status: Home / Self Care | Payer: Medicaid Other | Source: Ambulatory Visit | Attending: Urology | Admitting: Urology

## 2016-10-22 DIAGNOSIS — N200 Calculus of kidney: Secondary | ICD-10-CM

## 2016-10-22 DIAGNOSIS — R109 Unspecified abdominal pain: Secondary | ICD-10-CM | POA: Diagnosis not present

## 2016-10-22 LAB — PREGNANCY, URINE: PREG TEST UR: NEGATIVE

## 2016-10-25 ENCOUNTER — Ambulatory Visit (INDEPENDENT_AMBULATORY_CARE_PROVIDER_SITE_OTHER): Payer: Medicaid Other | Admitting: Urology

## 2016-10-25 ENCOUNTER — Encounter: Payer: Self-pay | Admitting: Urology

## 2016-10-25 VITALS — BP 117/79 | HR 92 | Ht 59.0 in | Wt 102.0 lb

## 2016-10-25 DIAGNOSIS — Z87442 Personal history of urinary calculi: Secondary | ICD-10-CM

## 2016-10-25 DIAGNOSIS — R109 Unspecified abdominal pain: Secondary | ICD-10-CM

## 2016-10-25 DIAGNOSIS — R3129 Other microscopic hematuria: Secondary | ICD-10-CM

## 2016-10-25 LAB — URINALYSIS, COMPLETE
BILIRUBIN UA: NEGATIVE
GLUCOSE, UA: NEGATIVE
Leukocytes, UA: NEGATIVE
NITRITE UA: NEGATIVE
UUROB: 0.2 mg/dL (ref 0.2–1.0)
pH, UA: 5.5 (ref 5.0–7.5)

## 2016-10-25 LAB — MICROSCOPIC EXAMINATION

## 2016-10-25 NOTE — Progress Notes (Signed)
10/25/2016 4:18 PM   Charlene Ferguson 12/10/1985 [id number]  Referring provider: Evelene Croon, MD [facility] [address] [place], [place] [address]  Chief Complaint  Patient presents with  . New Patient (Initial Visit)    kidney stones referred by Dr Glenis Smoker    HPI: Patient is a 31 year old female who is referred to [place] by Dr. Glenis Smoker for nephrolithiasis.    Patient has been having right flank pain radiating to the right waist and down the right leg.  This has been occurring for the last several weeks.  She states that the pain has been occurring more often and is more intense recently.  She states the pain is so severe that she gets dizzy.    She is having associated frequency and urgency.  She denies fevers, chills, nausea or vomiting.  Her UA today is positive for 3-10 RBC's.  She has seen blood in her urine.    She states that she has had a prior history of stones.  She was [age] at that time and does not remember the exact details of the treatment of her stone.  She does remember having surgery.  Her stone composition is unknown.    PMH: Past Medical History:  Diagnosis Date  . Heart murmur   . Kidney stones   . Ovarian cyst 2010   bilateral side. R cyst has been removed 2016  . SBO (small bowel obstruction)    2010  . 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 ABDOMINAL SURGERY    . OVARIAN CYST SURGERY    . SMALL INTESTINE SURGERY     at birth to remove volvulus    Home Medications:  Allergies as of 10/25/2016      Reactions   Morphine And Related Nausea And Vomiting, Rash      Medication List       Accurate as of 10/25/16  4:18 PM. Always use your most recent med list.          cyclobenzaprine 5 MG tablet Commonly known as:  FLEXERIL Take 5 mg by mouth 3 (three) times daily as needed for muscle spasms.   desogestrel-ethinyl estradiol  0.15-30 MG-MCG tablet Commonly known as:  APRI,EMOQUETTE,SOLIA Take one active pill daily, skip placebo pills, for menses suppression   dicyclomine 20 MG tablet Commonly known as:  BENTYL Take 1 tablet (20 mg total) by mouth 2 (two) times daily.   DULoxetine 60 MG capsule Commonly known as:  CYMBALTA Take 60 mg by mouth daily.   HYDROcodone-acetaminophen 5-325 MG tablet Commonly known as:  NORCO/VICODIN Take 1 tablet by mouth every 6 (six) hours as needed for moderate pain.   lubiprostone 24 MCG capsule Commonly known as:  AMITIZA Take 24 mcg by mouth 2 (two) times daily with a meal.   norgestimate-ethinyl estradiol 0.25-35 MG-MCG tablet Commonly known as:  ORTHO-CYCLEN,SPRINTEC,PREVIFEM Take 1 tablet by mouth daily.   omeprazole 20 MG capsule Commonly known as:  PRILOSEC Take 20 mg by mouth daily.   polyethylene glycol packet Commonly known as:  MIRALAX / GLYCOLAX Take 17 g by mouth daily as needed for mild constipation.   promethazine 25 MG tablet Commonly known as:  PHENERGAN Take 1 tablet (25 mg total) by mouth every 6 (six) hours as needed for nausea or vomiting.   promethazine 25 MG suppository Commonly  known as:  PHENERGAN Place 1 suppository (25 mg total) rectally every 6 (six) hours as needed for nausea or vomiting.   tamsulosin 0.4 MG Caps capsule Commonly known as:  FLOMAX Take 0.4 mg by mouth daily. Take for 7 days   VITAMIN D PO Take 1 tablet by mouth daily.       Allergies:  Allergies  Allergen Reactions  . Morphine And Related Nausea And Vomiting and Rash    Family History: Family History  Problem Relation Age of Onset  . Prostate cancer Neg Hx   . Kidney disease Neg Hx   . Kidney cancer Neg Hx     Social History:  reports that she has never smoked. She has never used smokeless tobacco. She reports that she does not drink alcohol or use drugs.  ROS: UROLOGY Frequent Urination?: Yes Hard to postpone urination?: Yes Burning/pain with  urination?: No Get up at night to urinate?: No Leakage of urine?: No Urine stream starts and stops?: No Trouble starting stream?: No Do you have to strain to urinate?: No Blood in urine?: No Urinary tract infection?: No Sexually transmitted disease?: No Injury to kidneys or bladder?: No Painful intercourse?: No Weak stream?: No Currently pregnant?: No Vaginal bleeding?: No Last menstrual period?: n  Gastrointestinal Nausea?: No Vomiting?: No Indigestion/heartburn?: Yes Diarrhea?: Yes Constipation?: Yes  Constitutional Fever: No Night sweats?: Yes Weight loss?: No Fatigue?: Yes  Skin Skin rash/lesions?: No Itching?: No  Eyes Blurred vision?: No Double vision?: No  Ears/Nose/Throat Sore throat?: Yes Sinus problems?: No  Hematologic/Lymphatic Swollen glands?: Yes Easy bruising?: Yes  Cardiovascular Leg swelling?: No Chest pain?: No  Respiratory Cough?: No Shortness of breath?: No  Endocrine Excessive thirst?: Yes  Musculoskeletal Back pain?: Yes Joint pain?: Yes  Neurological Headaches?: Yes Dizziness?: Yes  Psychologic Depression?: No Anxiety?: No  Physical Exam: BP 117/79   Pulse 92   Ht 4\' 11"  (1.499 m)   Wt 102 lb (46.3 kg)   BMI 20.60 kg/m   Constitutional: Well nourished. Alert and oriented, No acute distress. HEENT: [place] AT, moist mucus membranes. Trachea midline, no masses. Cardiovascular: No clubbing, cyanosis, or edema. Respiratory: Normal respiratory effort, no increased work of breathing. GI: Abdomen is soft, non tender, non distended, no abdominal masses. Liver and spleen not palpable.  No hernias appreciated.  Stool sample for occult testing is not indicated.   GU: No CVA tenderness.  No bladder fullness or masses.   Skin: No rashes, bruises or suspicious lesions. Lymph: No cervical or inguinal adenopathy. Neurologic: Grossly intact, no focal deficits, moving all 4 extremities. Psychiatric: Normal mood and  affect.  Laboratory Data: Lab Results  Component Value Date   WBC 7.6 04/02/2016   HGB 13.5 04/02/2016   HCT 40.4 04/02/2016   MCV 88.6 04/02/2016   PLT 327 04/02/2016    Lab Results  Component Value Date   CREATININE 0.64 04/02/2016    Lab Results  Component Value Date   AST 19 04/02/2016   Lab Results  Component Value Date   ALT 12 (L) 04/02/2016     Urinalysis 3-10 RBC's.  See EPIC.    Pertinent Imaging: CLINICAL DATA:  Kidney stone.  EXAM: ABDOMEN - 1 VIEW  COMPARISON:  Ultrasound 12/29/2008.  CT 09/07/2008 .  FINDINGS: Soft tissue structures are unremarkable. No pathologic intra- abdominal calcification. Stool noted throughout the colon. Constipation cannot be excluded. No bowel distention. Tiny sclerotic density noted over the right acetabulum most likely tiny bone island. No acute  bony abnormality.  IMPRESSION: 1. No pathologic intra- abdominal calcifications identified. No evidence of ureteral stone. 2. Stool noted throughout the colon. Constipation cannot be excluded.   Electronically Signed   By: Maisie Fushomas  Register   On: 10/22/2016 17:13  Assessment & Plan:    1.  Right flank pain  - history of nephrolithiasis  - no stone seen on KUB  - microscopic hematuria on today's UA  - obtain CT Renal stone study  - Urinalysis, Complete  - CULTURE, URINE COMPREHENSIVE  - hCG, serum, qualitative  - RTC for report  - Advised to contact our office or seek treatment in the ED if becomes febrile or pain/ vomiting are difficult control in order to arrange for emergent/urgent intervention  2. Microscopic hematuria  - We will continue to monitor the patient's UA after the treatment/passage of the stone to ensure the hematuria has resolved.  If hematuria persists, we will pursue a hematuria workup with CT Urogram and cystoscopy if appropriate.   3. History of stones  - see above    Return for RTC for CT report.  These notes generated with voice  recognition software. I apologize for typographical errors.  Michiel CowboySHANNON Kinzey Sheriff, PA-C  [facility] [address], [address] [place], [place] [address] [phone]

## 2016-10-26 LAB — HCG, SERUM, QUALITATIVE: hCG,Beta Subunit,Qual,Serum: NEGATIVE m[IU]/mL (ref <6)

## 2016-10-27 LAB — CULTURE, URINE COMPREHENSIVE

## 2016-10-29 ENCOUNTER — Ambulatory Visit
Admission: RE | Admit: 2016-10-29 | Discharge: 2016-10-29 | Disposition: A | Discharge status: Home / Self Care | Payer: Medicaid Other | Source: Ambulatory Visit | Attending: Urology | Admitting: Urology

## 2016-10-29 DIAGNOSIS — N281 Cyst of kidney, acquired: Secondary | ICD-10-CM | POA: Diagnosis not present

## 2016-10-29 DIAGNOSIS — R3129 Other microscopic hematuria: Secondary | ICD-10-CM | POA: Insufficient documentation

## 2016-10-29 DIAGNOSIS — R109 Unspecified abdominal pain: Secondary | ICD-10-CM | POA: Insufficient documentation

## 2016-11-02 ENCOUNTER — Other Ambulatory Visit
Admission: RE | Admit: 2016-11-02 | Discharge: 2016-11-02 | Disposition: A | Discharge status: Home / Self Care | Payer: Medicaid Other | Source: Ambulatory Visit | Attending: Urology | Admitting: Urology

## 2016-11-02 ENCOUNTER — Encounter: Payer: Self-pay | Admitting: Urology

## 2016-11-02 ENCOUNTER — Ambulatory Visit (INDEPENDENT_AMBULATORY_CARE_PROVIDER_SITE_OTHER): Payer: Medicaid Other | Admitting: Urology

## 2016-11-02 VITALS — BP 109/80 | HR 97 | Ht 59.0 in | Wt 101.0 lb

## 2016-11-02 DIAGNOSIS — R3129 Other microscopic hematuria: Secondary | ICD-10-CM | POA: Insufficient documentation

## 2016-11-02 DIAGNOSIS — Z87442 Personal history of urinary calculi: Secondary | ICD-10-CM | POA: Diagnosis not present

## 2016-11-02 DIAGNOSIS — R109 Unspecified abdominal pain: Secondary | ICD-10-CM

## 2016-11-02 LAB — URINALYSIS, COMPLETE (UACMP) WITH MICROSCOPIC
Bilirubin Urine: NEGATIVE
Glucose, UA: NEGATIVE mg/dL
Ketones, ur: NEGATIVE mg/dL
LEUKOCYTES UA: NEGATIVE
NITRITE: NEGATIVE
Specific Gravity, Urine: 1.025 (ref 1.005–1.030)
pH: 6 (ref 5.0–8.0)

## 2016-11-02 NOTE — Progress Notes (Signed)
11/02/2016 8:14 PM   Charlene Ferguson Feb 07, 1986 [id number]  Referring provider: Evelene Croon, MD [facility] [address] [place], [place] [address]  Chief Complaint  Patient presents with  . Results    CT    HPI: Patient is a 31 year old female who presents today to review her CT Renal stone study.  Background history Patient is referred to [place] by Dr. Glenis Smoker for nephrolithiasis.  Patient has been having right flank pain radiating to the right waist and down the right leg.  This has been occurring for the last several weeks.  She states that the pain has been occurring more often and is more intense recently.  She states the pain is so severe that she gets dizzy.  She is having associated frequency and urgency.  She denies fevers, chills, nausea or vomiting.  Her UA today is positive for 3-10 RBC's.  She has seen blood in her urine.  She states that she has had a prior history of stones.  She was [age] at that time and does not remember the exact details of the treatment of her stone.  She does remember having surgery.  Her stone composition is unknown.    CT Renal stone study performed on 10/29/2016 noted no evidence of urolithiasis, hydronephrosis, or other acute findings.  I personally reviewed the films with the patient.  Today, she complains of frequency, nausea, GERD, diarrhea and constipation.  She continues to have pain in her right lower quadrant which she states radiates up into her right flank and down her right leg. She has not passed any fragments. She is not had gross hematuria, dysuria or suprapubic pain. She denies fevers, chills and vomiting.  Her UA today is positive for 6-30 RBC's.     PMH: Past Medical History:  Diagnosis Date  . Heart murmur   . Kidney stones   . Ovarian cyst 2010   bilateral side. R cyst has been removed 2016  . SBO (small bowel obstruction)    2010  . 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 ABDOMINAL SURGERY    . OVARIAN CYST SURGERY    . SMALL INTESTINE SURGERY     at birth to remove volvulus    Home Medications:  Allergies as of 11/02/2016      Reactions   Morphine And Related Nausea And Vomiting, Rash      Medication List       Accurate as of 11/02/16 11:59 PM. Always use your most recent med list.          cyclobenzaprine 5 MG tablet Commonly known as:  FLEXERIL Take 5 mg by mouth 3 (three) times daily as needed for muscle spasms.   desogestrel-ethinyl estradiol 0.15-30 MG-MCG tablet Commonly known as:  APRI,EMOQUETTE,SOLIA Take one active pill daily, skip placebo pills, for menses suppression   dicyclomine 20 MG tablet Commonly known as:  BENTYL Take 1 tablet (20 mg total) by mouth 2 (two) times daily.   DULoxetine 60 MG capsule Commonly known as:  CYMBALTA Take 60 mg by mouth daily.   HYDROcodone-acetaminophen 5-325 MG tablet Commonly known as:  NORCO/VICODIN Take 1 tablet by mouth every 6 (six) hours as needed for moderate pain.   lubiprostone 24 MCG capsule Commonly known as:  AMITIZA Take 24 mcg by mouth 2 (two) times daily with a  meal.   norgestimate-ethinyl estradiol 0.25-35 MG-MCG tablet Commonly known as:  ORTHO-CYCLEN,SPRINTEC,PREVIFEM Take 1 tablet by mouth daily.   omeprazole 20 MG capsule Commonly known as:  PRILOSEC Take 20 mg by mouth daily.   polyethylene glycol packet Commonly known as:  MIRALAX / GLYCOLAX Take 17 g by mouth daily as needed for mild constipation.   promethazine 25 MG tablet Commonly known as:  PHENERGAN Take 1 tablet (25 mg total) by mouth every 6 (six) hours as needed for nausea or vomiting.   promethazine 25 MG suppository Commonly known as:  PHENERGAN Place 1 suppository (25 mg total) rectally every 6 (six) hours as needed for nausea or vomiting.   tamsulosin 0.4 MG Caps capsule Commonly known as:  FLOMAX Take 0.4 mg by  mouth daily. Take for 7 days   VITAMIN D PO Take 1 tablet by mouth daily.       Allergies:  Allergies  Allergen Reactions  . Morphine And Related Nausea And Vomiting and Rash    Family History: Family History  Problem Relation Age of Onset  . Prostate cancer Neg Hx   . Kidney disease Neg Hx   . Kidney cancer Neg Hx     Social History:  reports that she has never smoked. She has never used smokeless tobacco. She reports that she does not drink alcohol or use drugs.  ROS: UROLOGY Frequent Urination?: Yes Hard to postpone urination?: No Burning/pain with urination?: No Get up at night to urinate?: No Leakage of urine?: No Urine stream starts and stops?: No Trouble starting stream?: No Do you have to strain to urinate?: No Blood in urine?: No Urinary tract infection?: No Sexually transmitted disease?: No Injury to kidneys or bladder?: No Painful intercourse?: No Weak stream?: No Currently pregnant?: No Vaginal bleeding?: No Last menstrual period?: n  Gastrointestinal Nausea?: Yes Vomiting?: No Indigestion/heartburn?: Yes Diarrhea?: Yes Constipation?: Yes  Constitutional Fever: No Night sweats?: No Weight loss?: No Fatigue?: Yes  Skin Skin rash/lesions?: No Itching?: No  Eyes Blurred vision?: No Double vision?: No  Ears/Nose/Throat Sore throat?: No Sinus problems?: No  Hematologic/Lymphatic Swollen glands?: No Easy bruising?: No  Cardiovascular Leg swelling?: No Chest pain?: No  Respiratory Cough?: No Shortness of breath?: No  Endocrine Excessive thirst?: Yes  Musculoskeletal Back pain?: Yes Joint pain?: No  Neurological Headaches?: Yes Dizziness?: No  Psychologic Depression?: No Anxiety?: No  Physical Exam: BP 109/80   Pulse 97   Ht 4\' 11"  (1.499 m)   Wt 101 lb (45.8 kg)   BMI 20.40 kg/m   Constitutional: Well nourished. Alert and oriented, No acute distress. HEENT: [place] AT, moist mucus membranes. Trachea midline, no  masses. Cardiovascular: No clubbing, cyanosis, or edema. Respiratory: Normal respiratory effort, no increased work of breathing. GI: Abdomen is soft, non tender, non distended, no abdominal masses. Liver and spleen not palpable.  No hernias appreciated.  Stool sample for occult testing is not indicated.   GU: No CVA tenderness.  No bladder fullness or masses.   Skin: No rashes, bruises or suspicious lesions. Lymph: No cervical or inguinal adenopathy. Neurologic: Grossly intact, no focal deficits, moving all 4 extremities. Psychiatric: Normal mood and affect.  Laboratory Data: Lab Results  Component Value Date   WBC 7.6 04/02/2016   HGB 13.5 04/02/2016   HCT 40.4 04/02/2016   MCV 88.6 04/02/2016   PLT 327 04/02/2016    Lab Results  Component Value Date   CREATININE 0.64 04/02/2016    Lab Results  Component  Value Date   AST 19 04/02/2016   Lab Results  Component Value Date   ALT 12 (L) 04/02/2016     Urinalysis 6-30 RBC's.  See EPIC.    Pertinent Imaging: CLINICAL DATA:  Right flank pain for 4 months. Microscopic hematuria. Nephrolithiasis.  EXAM: CT ABDOMEN AND PELVIS WITHOUT CONTRAST  TECHNIQUE: Multidetector CT imaging of the abdomen and pelvis was performed following the standard protocol without IV contrast.  COMPARISON:  09/07/2008  FINDINGS: Lower chest: No acute findings.  Hepatobiliary: No masses visualized on this unenhanced exam. Gallbladder is unremarkable.  Pancreas: No mass or inflammatory process visualized on this unenhanced exam.  Spleen:  Within normal limits in size.  Adrenals/Urinary tract: No evidence of urolithiasis or hydronephrosis. Tiny fluid attenuation cyst noted in upper pole of left kidney. Unremarkable appearance of bladder.  Stomach/Bowel: No evidence of obstruction, inflammatory process, or abnormal fluid collections.  Vascular/Lymphatic: No pathologically enlarged lymph nodes identified. No evidence of  abdominal aortic aneurysm.  Reproductive:  No mass or other significant abnormality.  Other:  None.  Musculoskeletal:  No suspicious bone lesions identified.  IMPRESSION: No evidence of urolithiasis, hydronephrosis, or other acute findings.   Electronically Signed   By: Myles RosenthalJohn  Stahl M.D.   On: 10/29/2016 11:26  Assessment & Plan:    1.  Right flank pain  - history of nephrolithiasis  - no stone seen on KUB  - microscopic hematuria continues on today's UA  - CT Renal stone study - negative  - Urinalysis, Complete  - CULTURE, URINE COMPREHENSIVE - if negative will suggest CT Urogram  - hCG, serum, qualitative - negative  - Advised to contact our office or seek treatment in the ED if becomes febrile or pain/ vomiting are difficult control in order to arrange for emergent/urgent intervention  2. Microscopic hematuria  - We will continue to monitor the patient's UA after the treatment/passage of the stone to ensure the hematuria has resolved.  If hematuria persists, we will pursue a hematuria workup with CT Urogram and cystoscopy if appropriate.  3. History of stones  - no stones seen on CT Renal stone study   Return for pending urine culture.  These notes generated with voice recognition software. I apologize for typographical errors.  Michiel CowboySHANNON Tamikka Pilger, PA-C  [facility] [address], [address] [place], [place] [address] [phone]

## 2016-11-03 LAB — URINE CULTURE: CULTURE: NO GROWTH

## 2016-11-05 ENCOUNTER — Telehealth: Payer: Self-pay | Admitting: *Deleted

## 2016-11-05 ENCOUNTER — Other Ambulatory Visit: Payer: Self-pay | Admitting: *Deleted

## 2016-11-05 DIAGNOSIS — R3129 Other microscopic hematuria: Secondary | ICD-10-CM

## 2016-11-05 NOTE — Telephone Encounter (Signed)
LMOM for patient to return our call.

## 2016-11-05 NOTE — Progress Notes (Signed)
[facility] with patient CT [facility]

## 2016-11-05 NOTE — Telephone Encounter (Signed)
Charlene Ferguson states patient returned my call on voicemail. Returned call to patient, spoke with patient and gave Shannon's message. Patient ok with going ahead with the CT and patient states no allergies to CT contrast or Dyes. Patient ok with the plan and knows that she is waiting on a phone call to schedule after insurance approval. HCG ordered

## 2016-11-05 NOTE — Telephone Encounter (Signed)
-----   Message from Harle BattiestShannon A McGowan, PA-C sent at 11/04/2016  5:34 PM EST ----- Please notify the patient that there is still blood in her urine.  The next step would be to perform a CT Urogram.  This is a CT scan with the use of contrast.  We can proceed if she has no allergies to contrast material, iodine or seafood.  There is a small chance of having a very severe reaction to the contrast that may result in hospitalization and/or death.  We will have to obtain a pregnancy test again prior to scheduling.

## 2016-11-14 ENCOUNTER — Ambulatory Visit: Admission: RE | Admit: 2016-11-14 | Payer: Medicaid Other | Source: Ambulatory Visit

## 2017-10-01 IMAGING — CT CT RENAL STONE PROTOCOL
2 of 4 series · 17 of 46 positions shown, 19 images · non-contrast
Comparison: 09/07/2008

CLINICAL DATA: Right flank pain for 4 months. Microscopic
hematuria. Nephrolithiasis.

EXAM:
CT ABDOMEN AND PELVIS WITHOUT CONTRAST
TECHNIQUE: Multidetector CT imaging of the abdomen and pelvis was performed
following the standard protocol without IV contrast.

[Series 2: soft tissue · axial · 0.67mm/px · z∈[-813,-393]mm · 14 of 92 slices shown, 16 images]
[im 4/92  soft-tissue]
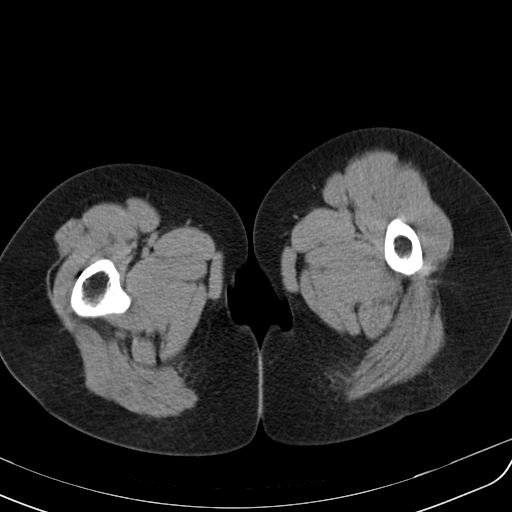
[im 4/92  bone]
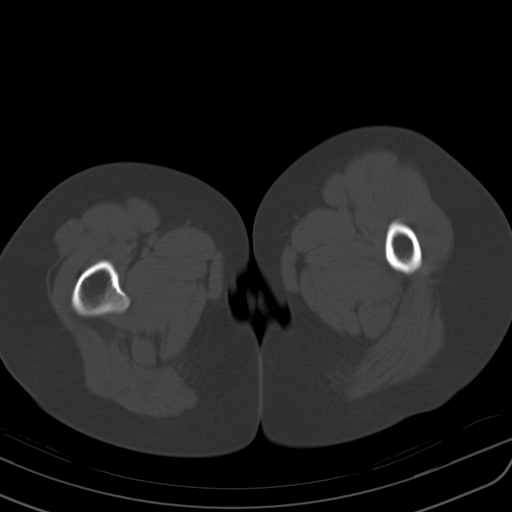
[im 11/92  soft-tissue]
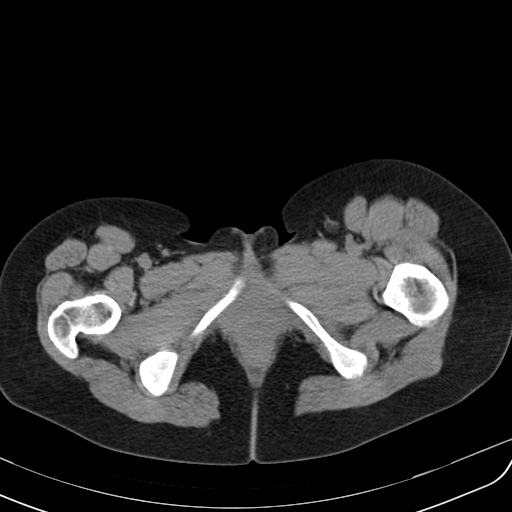
[im 19/92  soft-tissue]
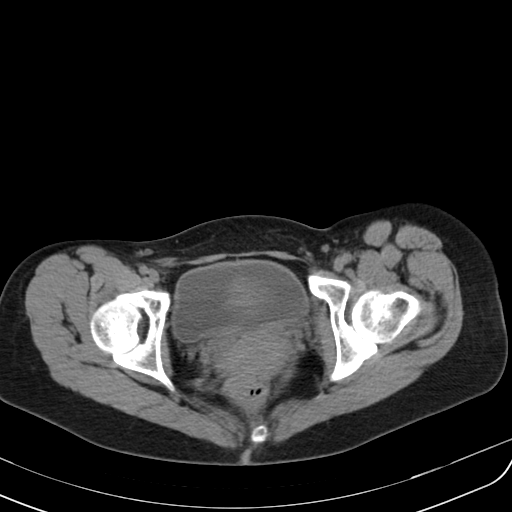
[im 26/92  soft-tissue]
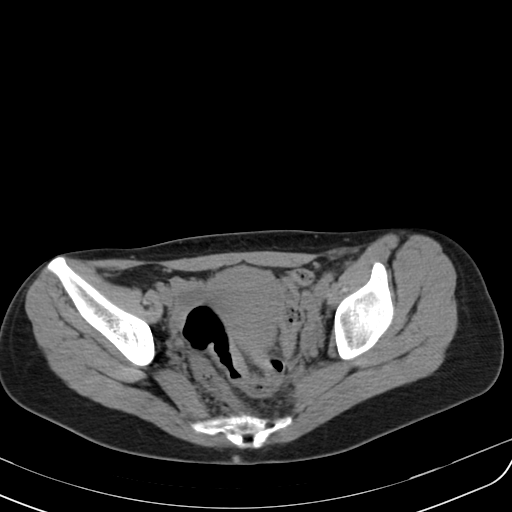
[im 30/92  soft-tissue]
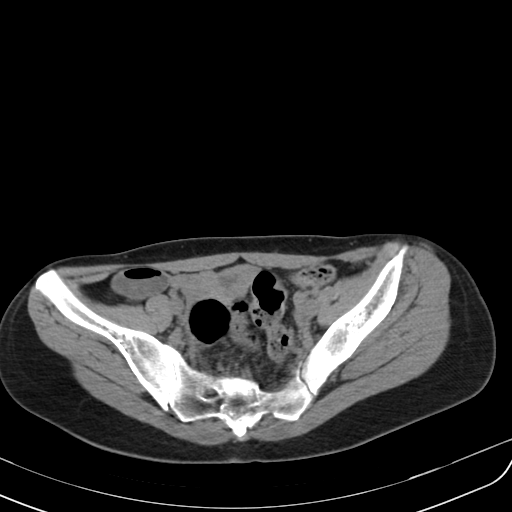
[im 37/92  soft-tissue]
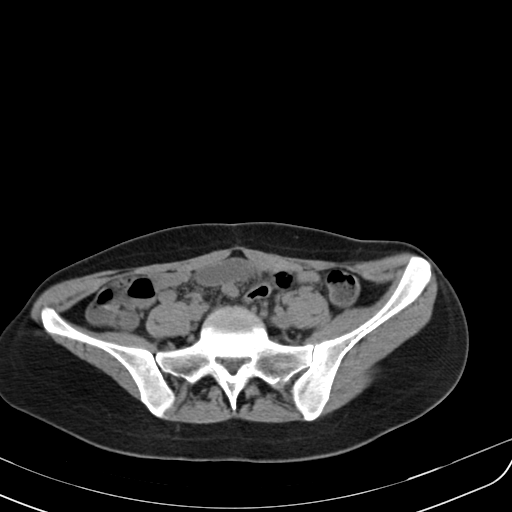
[im 44/92  soft-tissue]
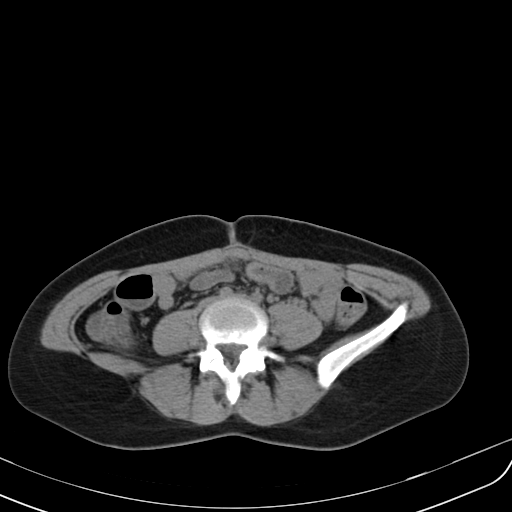
[im 48/92  soft-tissue]
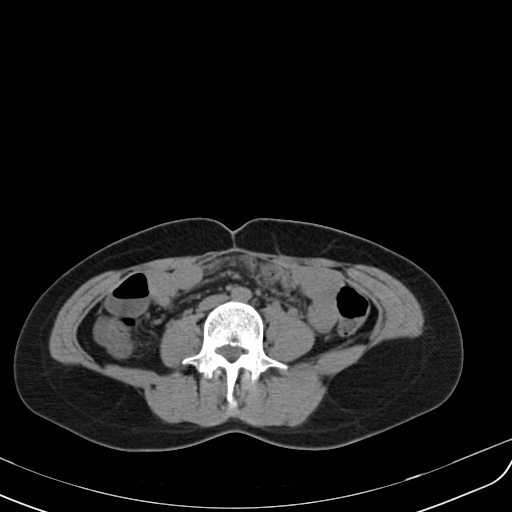
[im 55/92  soft-tissue]
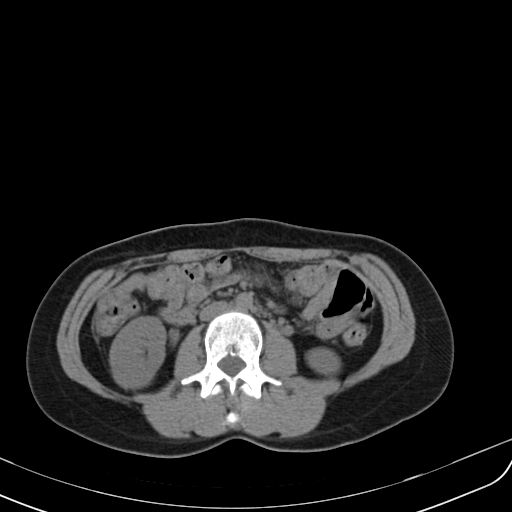
[im 55/92  bone]
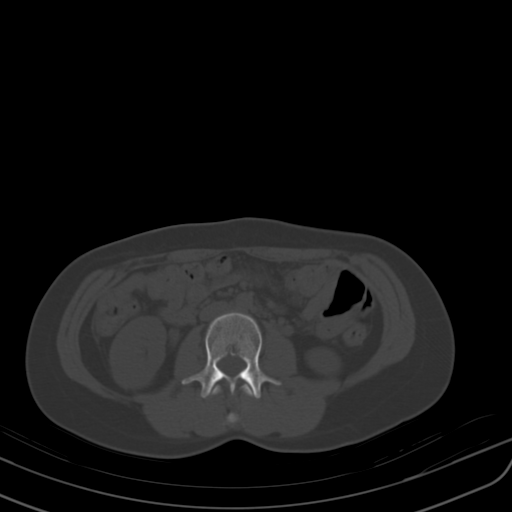
[im 62/92  soft-tissue]
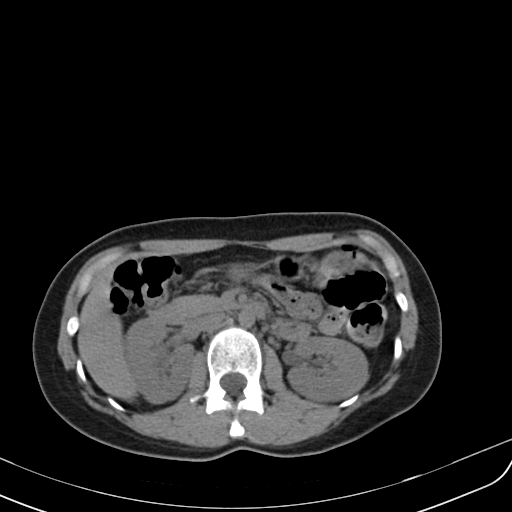
[im 70/92  soft-tissue]
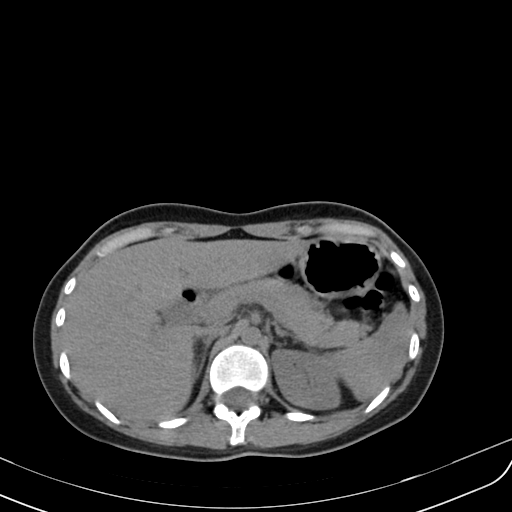
[im 73/92  soft-tissue]
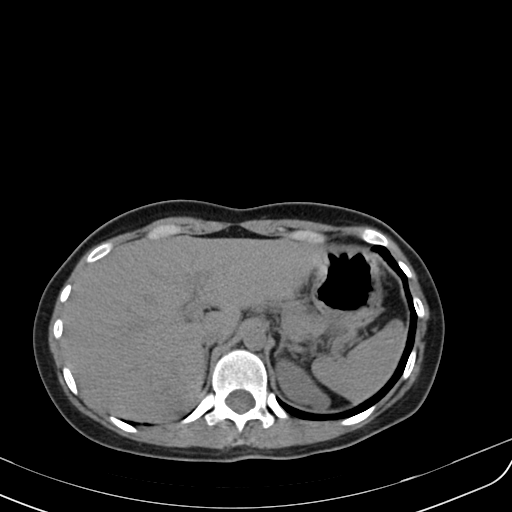
[im 81/92  soft-tissue]
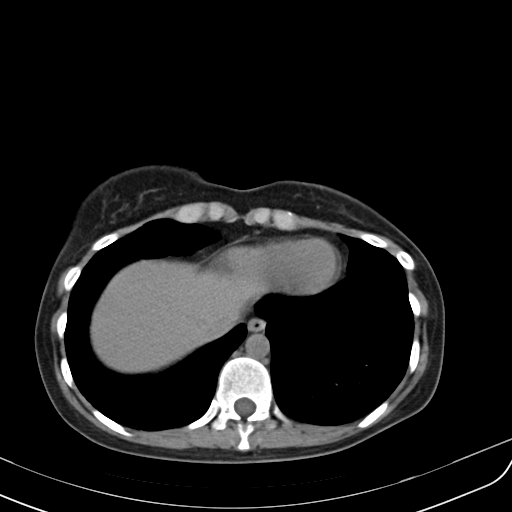
[im 88/92  soft-tissue]
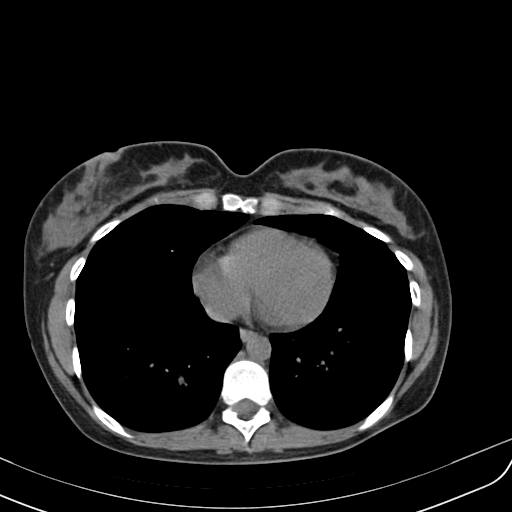

[Series 602: coronal · coronal · 0.89mm/px · 3 of 70 slices shown]
[im 24/70  soft-tissue]
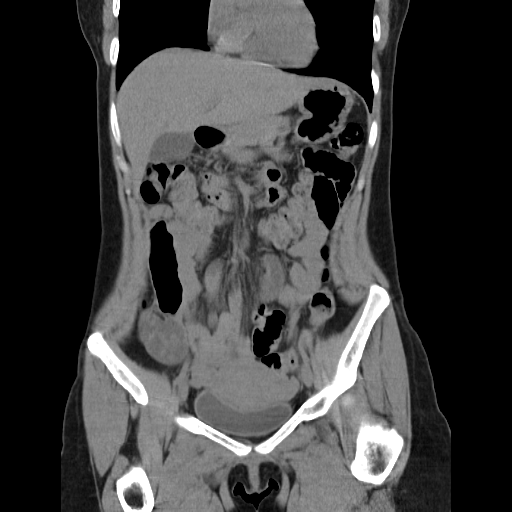
[im 31/70  soft-tissue]
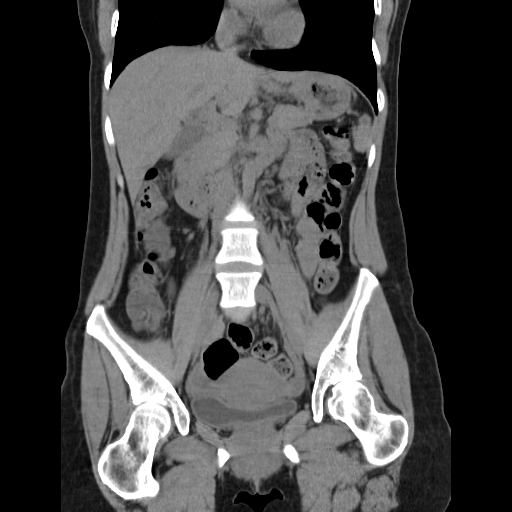
[im 39/70  soft-tissue]
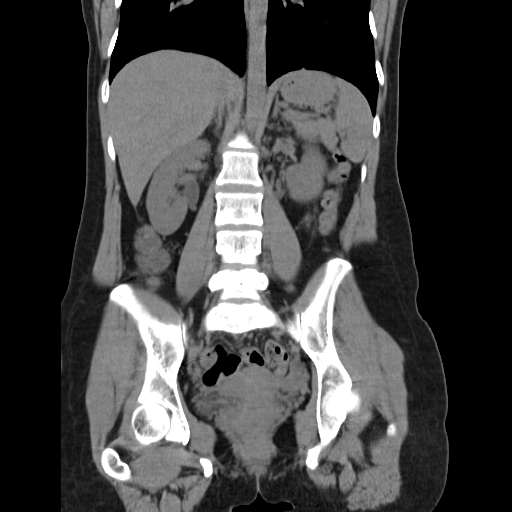

[17 of 46 positions shown; findings below may reference images not displayed]

FINDINGS: Lower chest: No acute findings.

Hepatobiliary: No masses visualized on this unenhanced exam.
Gallbladder is unremarkable.

Pancreas: No mass or inflammatory process visualized on this
unenhanced exam.

Spleen:  Within normal limits in size.

Adrenals/Urinary tract: No evidence of urolithiasis or
hydronephrosis. Tiny fluid attenuation cyst noted in upper pole of
left kidney. Unremarkable appearance of bladder.

Stomach/Bowel: No evidence of obstruction, inflammatory process, or
abnormal fluid collections.

Vascular/Lymphatic: No pathologically enlarged lymph nodes
identified. No evidence of abdominal aortic aneurysm.

Reproductive:  No mass or other significant abnormality.

Other:  None.

Musculoskeletal:  No suspicious bone lesions identified.
IMPRESSION: No evidence of urolithiasis, hydronephrosis, or other acute
findings.

## 2017-10-15 ENCOUNTER — Encounter: Payer: Self-pay | Admitting: Physician Assistant

## 2017-10-15 ENCOUNTER — Ambulatory Visit (INDEPENDENT_AMBULATORY_CARE_PROVIDER_SITE_OTHER): Payer: Worker's Compensation | Admitting: Physician Assistant

## 2017-10-15 ENCOUNTER — Other Ambulatory Visit: Payer: Self-pay

## 2017-10-15 VITALS — BP 110/72 | HR 109 | Temp 98.4°F | Resp 18 | Ht 59.0 in | Wt 110.6 lb

## 2017-10-15 DIAGNOSIS — Z23 Encounter for immunization: Secondary | ICD-10-CM | POA: Diagnosis not present

## 2017-10-15 DIAGNOSIS — S41111A Laceration without foreign body of right upper arm, initial encounter: Secondary | ICD-10-CM

## 2017-10-15 DIAGNOSIS — M79601 Pain in right arm: Secondary | ICD-10-CM

## 2017-10-15 DIAGNOSIS — W503XXA Accidental bite by another person, initial encounter: Secondary | ICD-10-CM

## 2017-10-15 MED ORDER — AMOXICILLIN-POT CLAVULANATE 875-125 MG PO TABS: 1.0000 | ORAL_TABLET | Freq: Two times a day (BID) | ORAL | 0 refills | Status: AC

## 2017-10-15 NOTE — Patient Instructions (Addendum)
Start antibiotics as prescribed. Return in 2 days for reevaluation. Keep the wound covered with a bandaid until it heals. Continue washing with soap and water.   Human Bite Human bite wounds can get infected very quickly. It is important to get medical treatment. Follow these instructions at home: Wound care  Follow instructions from your doctor about how to take care of your wound. Make sure you: ? Wash your hands with soap and water before you change your bandage (dressing). If you cannot use soap and water, use hand sanitizer. ? Change your bandage as told by your doctor. ? Leave stitches (sutures), skin glue, or skin tape (adhesive) strips in place. They may need to stay in place for 2 weeks or longer. If tape strips get loose and curl up, you may trim the loose edges. Do not remove tape strips completely unless your doctor says it is okay.  Check your wound every day for signs of infection. Watch for: ? Redness, swelling, or pain that gets worse. ? Fluid, blood, or pus. General instructions  Take or apply over-the-counter and prescription medicines only as told by your doctor.  If you were given an antibiotic, take or apply it as told by your doctor. Do not stop using the antibiotic even if your condition improves.  Keep the injured area raised (elevated) above the level of your heart while you are sitting or lying down.  If directed, apply ice to the injured area: ? Put ice in a plastic bag. ? Place a towel between your skin and the bag. ? Leave the ice on for 20 minutes, 2-3 times per day.  Keep all follow-up visits as told by your doctor. This is important. Contact a doctor if:  You have chills.  You have pain when you move your injured area.  You have trouble moving your injured area.  You are not getting better, or you are getting worse. Get help right away if:  You have increasing fluid, blood, or pus coming from your wound.  You have increasing redness, swelling,  or pain at the site of your wound.  You have a red streak going away from your wound.  You have a fever. This information is not intended to replace advice given to you by your health care provider. Make sure you discuss any questions you have with your health care provider. Document Released: 03/06/2001 Document Revised: 02/16/2016 Document Reviewed: 01/26/2015 Elsevier Interactive Patient Education  2018 [employer]    IF you received an x-ray today, you will receive an invoice from [facility]. Please contact [facility] at [phone] with questions or concerns regarding your invoice.   IF you received labwork today, you will receive an invoice from [facility]. Please contact [facility] at [phone] with questions or concerns regarding your invoice.   Our billing staff will not be able to assist you with questions regarding bills from these companies.  You will be contacted with the lab results as soon as they are available. The fastest way to get your results is to activate your My Chart account. Instructions are located on the last page of this paperwork. If you have not heard from us regarding the results in 2 weeks, please contact this office.
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
## 2017-10-17 ENCOUNTER — Encounter: Payer: Self-pay | Admitting: Physician Assistant

## 2017-10-17 ENCOUNTER — Ambulatory Visit (INDEPENDENT_AMBULATORY_CARE_PROVIDER_SITE_OTHER): Payer: Worker's Compensation | Admitting: Physician Assistant

## 2017-10-17 ENCOUNTER — Other Ambulatory Visit: Payer: Self-pay

## 2017-10-17 VITALS — BP 118/74 | HR 72 | Temp 98.4°F | Resp 18 | Ht 61.54 in | Wt 111.0 lb

## 2017-10-17 DIAGNOSIS — S41111A Laceration without foreign body of right upper arm, initial encounter: Secondary | ICD-10-CM | POA: Diagnosis not present

## 2017-10-17 DIAGNOSIS — W503XXA Accidental bite by another person, initial encounter: Secondary | ICD-10-CM | POA: Diagnosis not present

## 2017-10-17 DIAGNOSIS — M79601 Pain in right arm: Secondary | ICD-10-CM | POA: Diagnosis not present
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
## 2017-10-17 NOTE — Patient Instructions (Addendum)
  Your wound is healing well. Continue proper wound care and taking medication until complete. Thank you for letting me participate in your health and well being.   IF you received an x-ray today, you will receive an invoice from [facility]. Please contact [facility] at [phone] with questions or concerns regarding your invoice.   IF you received labwork today, you will receive an invoice from [facility]. Please contact [facility] at [phone] with questions or concerns regarding your invoice.   Our billing staff will not be able to assist you with questions regarding bills from these companies.  You will be contacted with the lab results as soon as they are available. The fastest way to get your results is to activate your My Chart account. Instructions are located on the last page of this paperwork. If you have not heard from us regarding the results in 2 weeks, please contact this office.

## 2017-10-18 ENCOUNTER — Encounter: Payer: Self-pay | Admitting: Physician Assistant

## 2017-11-18 ENCOUNTER — Other Ambulatory Visit: Payer: Self-pay

## 2017-11-18 ENCOUNTER — Encounter: Payer: Self-pay | Admitting: *Deleted

## 2017-11-19 NOTE — Discharge Instructions (Signed)
[facility] °[facility] °ENDOSCOPIC SINUS SURGERY ° [facility] ° °What is Functional Endoscopic Sinus Surgery? ° The Surgery involves making the natural openings of the sinuses larger by removing the bony partitions that separate the sinuses from the nasal cavity.  The natural sinus lining is preserved as much as possible to allow the sinuses to resume normal function after the surgery.  In some patients nasal polyps (excessively swollen lining of the sinuses) may be removed to relieve obstruction of the sinus openings.  The surgery is performed through the nose using lighted scopes, which eliminates the need for incisions on the face.  A septoplasty is a different procedure which is sometimes performed with sinus surgery.  It involves straightening the boy partition that separates the two sides of your nose.  A crooked or deviated septum may need repair if is obstructing the sinuses or nasal airflow.  Turbinate reduction is also often performed during sinus surgery.  The turbinates are bony proturberances from the side walls of the nose which swell and can obstruct the nose in patients with sinus and allergy problems.  Their size can be surgically reduced to help relieve nasal obstruction. ° °What Can Sinus Surgery Do For Me? ° Sinus surgery can reduce the frequency of sinus infections requiring antibiotic treatment.  This can provide improvement in nasal congestion, post-nasal drainage, facial pressure and nasal obstruction.  Surgery will NOT prevent you from ever having an infection again, so it usually only for patients who get infections 4 or more times yearly requiring antibiotics, or for infections that do not clear with antibiotics.  It will not cure nasal allergies, so patients with allergies may still require medication to treat their allergies after surgery. Surgery may improve headaches related to sinusitis, however, some people will continue to  require medication to control sinus headaches related to allergies.  Surgery will do nothing for other forms of headache (migraine, tension or cluster). ° °What Are the Risks of Endoscopic Sinus Surgery? ° Current techniques allow surgery to be performed safely with little risk, however, there are rare complications that patients should be aware of.  Because the sinuses are located around the eyes, there is risk of eye injury, including blindness, though again, this would be quite rare. This is usually a result of bleeding behind the eye during surgery, which puts the vision oat risk, though there are treatments to protect the vision and prevent permanent disrupted by surgery causing a leak of the spinal fluid that surrounds the brain.  More serious complications would include bleeding inside the brain cavity or damage to the brain.  Again, all of these complications are uncommon, and spinal fluid leaks can be safely managed surgically if they occur.  The most common complication of sinus surgery is bleeding from the nose, which may require packing or cauterization of the nose.  Continued sinus have polyps may experience recurrence of the polyps requiring revision surgery.  Alterations of sense of smell or injury to the tear ducts are also rare complications.  ° °What is the Surgery Like, and what is the Recovery? ° The Surgery usually takes a couple of hours to perform, and is usually performed under a general anesthetic (completely asleep).  Patients are usually discharged home after a couple of hours.  Sometimes during surgery it is necessary to pack the nose to control bleeding, and the packing is left in place for 24 - 48 hours, and removed by your surgeon.    If a septoplasty was performed during the procedure, there is often a splint placed which must be removed after 5-7 days.   °Discomfort: Pain is usually mild to moderate, and can be controlled by prescription pain medication or acetaminophen (Tylenol).   Aspirin, Ibuprofen (Advil, Motrin), or Naprosyn (Aleve) should be avoided, as they can cause increased bleeding.  Most patients feel sinus pressure like they have a bad head cold for several days.  Sleeping with your head elevated can help reduce swelling and facial pressure, as can ice packs over the face.  A humidifier may be helpful to keep the mucous and blood from drying in the nose.  ° °Diet: There are no specific diet restrictions, however, you should generally start with clear liquids and a light diet of bland foods because the anesthetic can cause some nausea.  Advance your diet depending on how your stomach feels.  Taking your pain medication with food will often help reduce stomach upset which pain medications can cause. ° °Nasal Saline Irrigation: It is important to remove blood clots and dried mucous from the nose as it is healing.  This is done by having you irrigate the nose at least 3 - 4 times daily with a salt water solution.  We recommend using NeilMed Sinus Rinse (available at the drug store).  Fill the squeeze bottle with the solution, bend over a sink, and insert the tip of the squeeze bottle into the nose ½ of an inch.  Point the tip of the squeeze bottle towards the inside corner of the eye on the same side your irrigating.  Squeeze the bottle and gently irrigate the nose.  If you bend forward as you do this, most of the fluid will flow back out of the nose, instead of down your throat.   The solution should be warm, near body temperature, when you irrigate.   Each time you irrigate, you should use a full squeeze bottle.  ° °Note that if you are instructed to use Nasal Steroid Sprays at any time after your surgery, irrigate with saline BEFORE using the steroid spray, so you do not wash it all out of the nose. °Another product, Nasal Saline Gel (such as AYR Nasal Saline Gel) can be applied in each nostril 3 - 4 times daily to moisture the nose and reduce scabbing or crusting. ° °Bleeding:   Bloody drainage from the nose can be expected for several days, and patients are instructed to irrigate their nose frequently with salt water to help remove mucous and blood clots.  The drainage may be dark red or brown, though some fresh blood may be seen intermittently, especially after irrigation.  Do not blow you nose, as bleeding may occur. If you must sneeze, keep your mouth open to allow air to escape through your mouth. ° °If heavy bleeding occurs: Irrigate the nose with saline to rinse out clots, then spray the nose 3 - 4 times with Afrin Nasal Decongestant Spray.  The spray will constrict the blood vessels to slow bleeding.  Pinch the lower half of your nose shut to apply pressure, and lay down with your head elevated.  Ice packs over the nose may help as well. If bleeding persists despite these measures, you should notify your doctor.  Do not use the Afrin routinely to control nasal congestion after surgery, as it can result in worsening congestion and may affect healing.  ° ° ° °Activity: Return to work varies among patients. Most patients will be   out of work at least 5 - 7 days to recover.  Patient may return to work after they are off of narcotic pain medication, and feeling well enough to perform the functions of their job.  Patients must avoid heavy lifting (over 10 pounds) or strenuous physical for 2 weeks after surgery, so your employer may need to assign you to light duty, or keep you out of work longer if light duty is not possible.  NOTE: you should not drive, operate dangerous machinery, do any mentally demanding tasks or make any important legal or financial decisions while on narcotic pain medication and recovering from the general anesthetic.  °  °Call Your Doctor Immediately if You Have Any of the Following: °1. Bleeding that you cannot control with the above measures °2. Loss of vision, double vision, bulging of the eye or black eyes. °3. Fever over 101 degrees °4. Neck stiffness with  severe headache, fever, nausea and change in mental state. °You are always encourage to call anytime with concerns, however, please call with requests for pain medication refills during office hours. ° °Office Endoscopy: During follow-up visits your doctor will remove any packing or splints that may have been placed and evaluate and clean your sinuses endoscopically.  Topical anesthetic will be used to make this as comfortable as possible, though you may want to take your pain medication prior to the visit.  How often this will need to be done varies from patient to patient.  After complete recovery from the surgery, you may need follow-up endoscopy from time to time, particularly if there is concern of recurrent infection or nasal polyps. ° ° °General Anesthesia, Adult, Care After °These instructions provide you with information about caring for yourself after your procedure. Your health care provider may also give you more specific instructions. Your treatment has been planned according to current medical practices, but problems sometimes occur. Call your health care provider if you have any problems or questions after your procedure. °What can I expect after the procedure? °After the procedure, it is common to have: °· Vomiting. °· A sore throat. °· Mental slowness. ° °It is common to feel: °· Nauseous. °· Cold or shivery. °· Sleepy. °· Tired. °· Sore or achy, even in parts of your body where you did not have surgery. ° °Follow these instructions at home: °For at least 24 hours after the procedure: °· Do not: °? Participate in activities where you could fall or become injured. °? Drive. °? Use heavy machinery. °? Drink alcohol. °? Take sleeping pills or medicines that cause drowsiness. °? Make important decisions or sign legal documents. °? Take care of children on your own. °· Rest. °Eating and drinking °· If you vomit, drink water, juice, or soup when you can drink without vomiting. °· Drink enough fluid to  keep your urine clear or pale yellow. °· Make sure you have little or no nausea before eating solid foods. °· Follow the diet recommended by your health care provider. °General instructions °· Have a responsible adult stay with you until you are awake and alert. °· Return to your normal activities as told by your health care provider. Ask your health care provider what activities are safe for you. °· Take over-the-counter and prescription medicines only as told by your health care provider. °· If you smoke, do not smoke without supervision. °· Keep all follow-up visits as told by your health care provider. This is important. °Contact a health care provider if: °· You   continue to have nausea or vomiting at home, and medicines are not helpful. °· You cannot drink fluids or start eating again. °· You cannot urinate after 8-12 hours. °· You develop a skin rash. °· You have fever. °· You have increasing redness at the site of your procedure. °Get help right away if: °· You have difficulty breathing. °· You have chest pain. °· You have unexpected bleeding. °· You feel that you are having a life-threatening or urgent problem. °This information is not intended to replace advice given to you by your health care provider. Make sure you discuss any questions you have with your health care provider. °Document Released: 12/17/2000 Document Revised: 02/13/2016 Document Reviewed: 08/25/2015 °Elsevier Interactive Patient Education © 2018 [employer] ° °

## 2017-11-21 ENCOUNTER — Ambulatory Visit: Payer: Medicaid Other | Admitting: Anesthesiology

## 2017-11-21 ENCOUNTER — Encounter
Admission: RE | Disposition: A | Discharge status: Home / Self Care | Payer: Self-pay | Source: Ambulatory Visit | Attending: Otolaryngology

## 2017-11-21 ENCOUNTER — Ambulatory Visit
Admission: RE | Admit: 2017-11-21 | Discharge: 2017-11-21 | Disposition: A | Discharge status: Home / Self Care | Payer: Medicaid Other | Source: Ambulatory Visit | Attending: Otolaryngology | Admitting: Otolaryngology

## 2017-11-21 DIAGNOSIS — J342 Deviated nasal septum: Secondary | ICD-10-CM | POA: Insufficient documentation

## 2017-11-21 DIAGNOSIS — K589 Irritable bowel syndrome without diarrhea: Secondary | ICD-10-CM | POA: Diagnosis not present

## 2017-11-21 DIAGNOSIS — J343 Hypertrophy of nasal turbinates: Secondary | ICD-10-CM | POA: Insufficient documentation

## 2017-11-21 DIAGNOSIS — J329 Chronic sinusitis, unspecified: Secondary | ICD-10-CM | POA: Insufficient documentation

## 2017-11-21 DIAGNOSIS — R011 Cardiac murmur, unspecified: Secondary | ICD-10-CM | POA: Insufficient documentation

## 2017-11-21 DIAGNOSIS — K219 Gastro-esophageal reflux disease without esophagitis: Secondary | ICD-10-CM | POA: Insufficient documentation

## 2017-11-21 HISTORY — DX: Motion sickness, initial encounter: T75.3XXA

## 2017-11-21 HISTORY — PX: NASAL SEPTOPLASTY W/ TURBINOPLASTY: SHX2070

## 2017-11-21 SURGERY — SEPTOPLASTY, NOSE, WITH NASAL TURBINATE REDUCTION
Anesthesia: General | Site: Nose | Laterality: Bilateral | Wound class: Clean Contaminated

## 2017-11-21 MED ORDER — MIDAZOLAM HCL 5 MG/5ML IJ SOLN
INTRAMUSCULAR | Status: DC | PRN
  Administered 2017-11-21: 2 mg via INTRAVENOUS

## 2017-11-21 MED ORDER — DEXMEDETOMIDINE HCL 200 MCG/2ML IV SOLN
INTRAVENOUS | Status: DC | PRN
  Administered 2017-11-21: 4 ug via INTRAVENOUS

## 2017-11-21 MED ORDER — SCOPOLAMINE 1 MG/3DAYS TD PT72
1.0000 | MEDICATED_PATCH | Freq: Once | TRANSDERMAL | Status: DC
  Administered 2017-11-21: 1.5 mg via TRANSDERMAL

## 2017-11-21 MED ORDER — LACTATED RINGERS IV SOLN: INTRAVENOUS | Status: DC

## 2017-11-21 MED ORDER — DEXTROSE 5 % IV SOLN
2000.0000 mg | Freq: Once | INTRAVENOUS | Status: AC
  Administered 2017-11-21: 2000 mg via INTRAVENOUS

## 2017-11-21 MED ORDER — ONDANSETRON HCL 4 MG/2ML IJ SOLN: 4.0000 mg | Freq: Once | INTRAMUSCULAR | Status: DC | PRN

## 2017-11-21 MED ORDER — ACETAMINOPHEN 10 MG/ML IV SOLN
1000.0000 mg | Freq: Once | INTRAVENOUS | Status: AC
  Administered 2017-11-21: 1000 mg via INTRAVENOUS

## 2017-11-21 MED ORDER — LIDOCAINE-EPINEPHRINE 1 %-1:100000 IJ SOLN
INTRAMUSCULAR | Status: DC | PRN
  Administered 2017-11-21: 4.5 mL

## 2017-11-21 MED ORDER — LIDOCAINE HCL (CARDIAC) 20 MG/ML IV SOLN
INTRAVENOUS | Status: DC | PRN
  Administered 2017-11-21: 50 mg via INTRAVENOUS

## 2017-11-21 MED ORDER — PHENYLEPHRINE HCL 0.5 % NA SOLN
NASAL | Status: DC | PRN
  Administered 2017-11-21: 30 mL via TOPICAL

## 2017-11-21 MED ORDER — SUCCINYLCHOLINE CHLORIDE 20 MG/ML IJ SOLN
INTRAMUSCULAR | Status: DC | PRN
  Administered 2017-11-21: 100 mg via INTRAVENOUS

## 2017-11-21 MED ORDER — ONDANSETRON HCL 4 MG/2ML IJ SOLN
INTRAMUSCULAR | Status: DC | PRN
  Administered 2017-11-21: 4 mg via INTRAVENOUS

## 2017-11-21 MED ORDER — OXYCODONE HCL 5 MG/5ML PO SOLN
5.0000 mg | Freq: Once | ORAL | Status: AC
  Administered 2017-11-21: 5 mg via ORAL

## 2017-11-21 MED ORDER — FENTANYL CITRATE (PF) 100 MCG/2ML IJ SOLN: 25.0000 ug | INTRAMUSCULAR | Status: DC | PRN

## 2017-11-21 MED ORDER — OXYCODONE HCL 5 MG PO TABS: 5.0000 mg | ORAL_TABLET | Freq: Once | ORAL | Status: DC | PRN

## 2017-11-21 MED ORDER — OXYMETAZOLINE HCL 0.05 % NA SOLN
2.0000 | Freq: Once | NASAL | Status: AC
  Administered 2017-11-21: 2 via NASAL

## 2017-11-21 MED ORDER — ACETAMINOPHEN 10 MG/ML IV SOLN: 1000.0000 mg | Freq: Once | INTRAVENOUS | Status: DC | PRN

## 2017-11-21 MED ORDER — PROPOFOL 10 MG/ML IV BOLUS
INTRAVENOUS | Status: DC | PRN
  Administered 2017-11-21: 150 mg via INTRAVENOUS
  Administered 2017-11-21: 30 mg via INTRAVENOUS

## 2017-11-21 MED ORDER — FENTANYL CITRATE (PF) 100 MCG/2ML IJ SOLN
INTRAMUSCULAR | Status: DC | PRN
  Administered 2017-11-21 (×2): 50 ug via INTRAVENOUS

## 2017-11-21 MED ORDER — DEXAMETHASONE SODIUM PHOSPHATE 4 MG/ML IJ SOLN
INTRAMUSCULAR | Status: DC | PRN
  Administered 2017-11-21: 10 mg via INTRAVENOUS

## 2017-11-21 MED ORDER — LACTATED RINGERS IV SOLN
INTRAVENOUS | Status: DC | PRN
  Administered 2017-11-21: 11:00:00 via INTRAVENOUS

## 2017-11-21 SURGICAL SUPPLY — 27 items
CANISTER SUCT 1200ML W/VALVE (MISCELLANEOUS) ×3 IMPLANT
CATH IV 18X1 1/4 SAFELET (CATHETERS) ×3 IMPLANT
COAGULATOR SUCT 8FR VV (MISCELLANEOUS) ×3 IMPLANT
DRAPE HEAD BAR (DRAPES) ×3 IMPLANT
ELECT REM PT RETURN 9FT ADLT (ELECTROSURGICAL) ×3
ELECTRODE REM PT RTRN 9FT ADLT (ELECTROSURGICAL) ×1 IMPLANT
GLOVE PI ULTRA LF STRL 7.5 (GLOVE) ×3 IMPLANT
GLOVE PI ULTRA NON LATEX 7.5 (GLOVE) ×6
IV CATH 18X1 1/4 SAFELET (CATHETERS) ×1
KIT TURNOVER KIT A (KITS) ×3 IMPLANT
NEEDLE ANESTHESIA  27G X 3.5 (NEEDLE)
NEEDLE ANESTHESIA 27G X 3.5 (NEEDLE) IMPLANT
PACK DRAPE NASAL/ENT (PACKS) ×3 IMPLANT
PACKING NASAL EPIS 4X2.4 XEROG (MISCELLANEOUS) ×3 IMPLANT
PATTIES SURGICAL .5 X3 (DISPOSABLE) ×3 IMPLANT
SOL ANTI-FOG 6CC FOG-OUT (MISCELLANEOUS) ×1 IMPLANT
SOL FOG-OUT ANTI-FOG 6CC (MISCELLANEOUS) ×2
SPLINT NASAL SEPTAL BLV .50 ST (MISCELLANEOUS) ×3 IMPLANT
STRAP BODY AND KNEE 60X3 (MISCELLANEOUS) ×3 IMPLANT
SUT CHROMIC 3-0 (SUTURE) ×2
SUT CHROMIC 3-0 KS 27XMFL CR (SUTURE) ×1
SUT ETHILON 3-0 KS 30 BLK (SUTURE) ×3 IMPLANT
SUT PLAIN GUT 4-0 (SUTURE) ×3 IMPLANT
SUTURE CHRMC 3-0 KS 27XMFL CR (SUTURE) ×1 IMPLANT
SYR 3ML LL SCALE MARK (SYRINGE) ×3 IMPLANT
TOWEL OR 17X26 4PK STRL BLUE (TOWEL DISPOSABLE) ×3 IMPLANT
WATER STERILE IRR 250ML POUR (IV SOLUTION) ×3 IMPLANT

## 2017-11-21 NOTE — H&P (Signed)
H&P has been reviewedand patient reevaluated,  and no changes necessary. To be downloaded later.  

## 2017-11-21 NOTE — Transfer of Care (Signed)
Immediate Anesthesia Transfer of Care Note  Patient: Charlene Ferguson  Procedure(s) Performed: NASAL SEPTOPLASTY WITH TURBINATE REDUCTION (Bilateral Nose)  Patient Location: PACU  Anesthesia Type: General  Level of Consciousness: awake, alert  and patient cooperative  Airway and Oxygen Therapy: Patient Spontanous Breathing and Patient connected to supplemental oxygen  Post-op Assessment: Post-op Vital signs reviewed, Patient's Cardiovascular Status Stable, Respiratory Function Stable, Patent Airway and No signs of Nausea or vomiting  Post-op Vital Signs: Reviewed and stable  Complications: No apparent anesthesia complications

## 2017-11-21 NOTE — Anesthesia Procedure Notes (Signed)
Procedure Name: Intubation Date/Time: 11/21/2017 10:30 AM Performed by: Janna Arch, CRNA Pre-anesthesia Checklist: Patient identified, Emergency Drugs available, Suction available and Patient being monitored Patient Re-evaluated:Patient Re-evaluated prior to induction Oxygen Delivery Method: Circle system utilized Preoxygenation: Pre-oxygenation with 100% oxygen Induction Type: IV induction Ventilation: Mask ventilation without difficulty Laryngoscope Size: Mac and 3 Grade View: Grade I Tube type: Oral Number of attempts: 1 Placement Confirmation: ETT inserted through vocal cords under direct vision,  positive ETCO2 and breath sounds checked- equal and bilateral Secured at: 21 cm Tube secured with: Tape Dental Injury: Teeth and Oropharynx as per pre-operative assessment

## 2017-11-21 NOTE — Anesthesia Postprocedure Evaluation (Signed)
Anesthesia Post Note  Patient: Charlene Ferguson  Procedure(s) Performed: NASAL SEPTOPLASTY WITH TURBINATE REDUCTION (Bilateral Nose)  Patient location during evaluation: PACU Anesthesia Type: General Level of consciousness: awake and alert, oriented and patient cooperative Pain management: pain level controlled Vital Signs Assessment: post-procedure vital signs reviewed and stable Respiratory status: spontaneous breathing, nonlabored ventilation and respiratory function stable Cardiovascular status: blood pressure returned to baseline and stable Postop Assessment: adequate PO intake Anesthetic complications: no    Reed BreechAndrea Izaih Kataoka

## 2017-11-21 NOTE — Anesthesia Preprocedure Evaluation (Signed)
Anesthesia Evaluation  Patient identified by MRN, date of birth, ID band Patient awake    Reviewed: Allergy & Precautions, NPO status , Patient's Chart, lab work & pertinent test results  History of Anesthesia Complications Negative for: history of anesthetic complications  Airway Mallampati: I  TM Distance: >3 FB Neck ROM: Full    Dental no notable dental hx.    Pulmonary neg pulmonary ROS,    Pulmonary exam normal breath sounds clear to auscultation       Cardiovascular Exercise Tolerance: Good + Valvular Problems/Murmurs (murmur)  Rhythm:Regular Rate:Normal + Systolic murmurs    Neuro/Psych negative neurological ROS     GI/Hepatic GERD  Medicated and Controlled,IBS   Endo/Other  negative endocrine ROS  Renal/GU Renal disease (hx nephrolithiasis)     Musculoskeletal   Abdominal   Peds  Hematology negative hematology ROS (+)   Anesthesia Other Findings   Reproductive/Obstetrics                             Anesthesia Physical Anesthesia Plan  ASA: II  Anesthesia Plan: General   Post-op Pain Management:    Induction: Intravenous  PONV Risk Score and Plan: 3 and Dexamethasone, Ondansetron and Scopolamine patch - Pre-op  Airway Management Planned: Oral ETT  Additional Equipment:   Intra-op Plan:   Post-operative Plan: Extubation in OR  Informed Consent: I have reviewed the patients History and Physical, chart, labs and discussed the procedure including the risks, benefits and alternatives for the proposed anesthesia with the patient or authorized representative who has indicated his/her understanding and acceptance.     Plan Discussed with: CRNA  Anesthesia Plan Comments:         Anesthesia Quick Evaluation

## 2017-11-21 NOTE — Op Note (Signed)
11/21/2017  11:46 AM  [id number]   Pre-Op Dx:  Deviated Nasal Septum, Hypertrophic Inferior Turbinates  Post-op Dx: Same  Proc: Nasal Septoplasty, Bilateral Partial Reduction Inferior Turbinates   Surg:  Beverly SessionsPaul H Dhruv Christina  Anes:  GOT  EBL: 50 mL  Comp: None  Findings: Septum was markedly deviated to the right side completely obstructing the airway here.  The inferior turbinates were enlarged.  The right middle turbinate was lateralized.  Procedure: With the patient in a comfortable supine position,  general orotracheal anesthesia was induced without difficulty.     The patient received preoperative Afrin spray for topical decongestion and vasoconstriction.  Intravenous prophylactic antibiotics were administered.  At an appropriate level, the patient was placed in a semi-sitting position.  Nasal vibrissae were trimmed.   1% Xylocaine with 1:100,000 epinephrine, 4.5 cc's, was infiltrated into the anterior floor of the nose, into the nasal spine region, into the membranous columella, and finally into the submucoperichondrial plane of the septum on both sides.  Several minutes were allowed for this to take effect.  Cottoniod pledgetts soaked in Afrin and 4% Xylocaine were placed into both nasal cavities and left while the patient was prepped and draped in the standard fashion.  The materials were removed from the nose and observed to be intact and correct in number.  The nose was inspected with a headlight and the 0 degree scope with the findings as described above.  A left Killian incision was sharply executed and carried down to the quadrangular cartilage. The mucoperichondrium was elelvated along the quadrangular plate back to the bony-cartilaginous junction. The mucoperiostium was then elevated along the ethmoid plate and the vomer. The boney-catilaginous junction was then split with a freer elevator and the mucoperiosteum was elevated on the opposite side. The mucoperiosteum was then  elevated along the maxillary crest as needed to expose the crooked bone of the crest.  Boney spurs of the vomer and maxillary crest were removed with Lenoria Chimeakahashi forceps.  The cartilaginous plate was trimmed along its posterior and inferior borders of about 2 mm of cartilage to free it up inferiorly. Some of the deviated ethmoid plate was then fractured and removed with Takahashi forceps to free up the posterior border of the quadrangular plate and allow it to swing back to the midline. The mucosal flaps were placed back into their anatomic position to allow visualization of the airways. The septum now sat in the midline with an improved airway.  There was a tear of mucosa on the right side where the vomer spur was sticking out.  A 3-0 Chromic suture on a Keith needle in used to anchor the inferior septum at the nasal spine with a through and through suture. The mucosal flaps are then sutured together using a through and through whip stitch of 4-0 Plain Gut with a mini-Keith needle.  This was used to close the McCarrKillian incision and the mucosal tear on the right side as well.   The inferior turbinates were then inspected. An incision was created along the inferior aspect of the left inferior turbinate with removal of some of the inferior soft tissue and bone. Electrocautery was used to control bleeding in the area. The remaining turbinate was then outfractured to open up the airway further. There was no significant bleeding noted. The right turbinate was then trimmed and outfractured in a similar fashion.  The right middle turbinate was infractured using the 0 degree scope to visualize this well.  Xerogel was placed into the  middle meatus to help hold this in a more normal position to allow better drainage from the sinuses.  The airways were then visualized and showed open passageways on both sides that were significantly improved compared to before surgery. There was no signifcant bleeding. Nasal splints were  applied to both sides of the septum using Xomed 0.58mm regular sized splints that were trimmed, and then held in position with a 3-0 Nylon through and through suture.  The patient was turned back over to anesthesia, and awakened, extubated, and taken to the PACU in satisfactory condition.  Dispo:   PACU to home  Plan: Ice, elevation, narcotic analgesia, steroid taper, and prophylactic antibiotics for the duration of indwelling nasal foreign bodies.  We will reevaluate the patient in the office in 6 days and remove the septal splints.  Return to work in 10 days, strenuous activities in two weeks.   Beverly Sessions Onelia Cadmus 11/21/2017 11:46 AM

## 2018-02-11 ENCOUNTER — Other Ambulatory Visit: Payer: Self-pay

## 2018-02-11 ENCOUNTER — Ambulatory Visit: Payer: Managed Care, Other (non HMO) | Attending: Obstetrics & Gynecology | Admitting: Physical Therapy

## 2018-02-11 ENCOUNTER — Encounter: Payer: Self-pay | Admitting: Physical Therapy

## 2018-02-11 DIAGNOSIS — R252 Cramp and spasm: Secondary | ICD-10-CM | POA: Insufficient documentation

## 2018-02-11 DIAGNOSIS — M6281 Muscle weakness (generalized): Secondary | ICD-10-CM | POA: Insufficient documentation

## 2018-02-11 DIAGNOSIS — R278 Other lack of coordination: Secondary | ICD-10-CM | POA: Diagnosis present

## 2018-02-11 DIAGNOSIS — R293 Abnormal posture: Secondary | ICD-10-CM | POA: Diagnosis present

## 2018-02-11 DIAGNOSIS — R2689 Other abnormalities of gait and mobility: Secondary | ICD-10-CM | POA: Insufficient documentation

## 2018-02-11 NOTE — Therapy (Signed)
[facility] [address] [place], [place], [address] Phone: [phone]   Fax:  [phone]  Physical Therapy Evaluation  Patient Details  Name: Charlene Ferguson MRN: [id number] Date of Birth: 1986-05-28 Referring Provider: Dr. Daleen Squibb Abu-Alnadi   Encounter Date: 02/11/2018  PT End of Session - 02/11/18 1131    Visit Number  1    Date for PT Re-Evaluation  05/06/18    PT Start Time  0930    PT Stop Time  1015    PT Time Calculation (min)  45 min    Activity Tolerance  Patient tolerated treatment well;Patient limited by pain    Behavior During Therapy  [facility] for tasks assessed/performed       Past Medical History:  Diagnosis Date  . Heart murmur   . Kidney stones   . Motion sickness    car back seat  . Ovarian cyst 2010   bilateral side. R cyst has been removed 2016  . SBO (small bowel obstruction) (HCC)    2010  . 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 ABDOMINAL SURGERY    . NASAL SEPTOPLASTY W/ TURBINOPLASTY Bilateral 11/21/2017   Procedure: NASAL SEPTOPLASTY WITH TURBINATE REDUCTION;  Surgeon: Vernie Murders, MD;  Location: [facility];  Service: ENT;  Laterality: Bilateral;  . OVARIAN CYST SURGERY    . SMALL INTESTINE SURGERY     at birth to remove volvulus    There were no vitals filed for this visit.   Subjective Assessment - 02/11/18 0938    Subjective  Patient has had chronic abdominal pain since 2011.  Patient was only able to see [place] Call[facility] one time due to insurance. Patient is having alot of pelvic floor flare-ups and stomach issues.  Patient has trouble with constipation and diarrhea.  No trouble with urination.     Pertinent History  Hx of volvulus with 4 in of small intestines removed at birth, R ovarian cyst removed, small bowel obstruction.     Patient Stated Goals  stop pain and  decrease constipation    Currently in Pain?  Yes    Pain Score  5  high 10/10    Pain Location  Abdomen    Pain Orientation  Mid    Pain Descriptors / Indicators  Spasm stinging    Pain Type  Chronic pain    Pain Onset  More than a month ago    Pain Frequency  Constant    Aggravating Factors   night time;     Pain Relieving Factors  not sure    Multiple Pain Sites  No         [facility] PT Assessment - 02/11/18 0001      Assessment   Medical Diagnosis  [id number] Myofascial pain dysfunction syndrome    Referring Provider  Dr. Daleen Squibb Abu-Alnadi    Prior Therapy  Yes for one session      Precautions   Precautions  None      Restrictions   Weight Bearing Restrictions  No      Balance Screen   Has the patient fallen in the past 6 months  No    Has the patient had a decrease in activity level because of a fear of falling?   No    Is the patient reluctant to leave  their home because of a fear of falling?   No      Home Public house manager residence      Prior Function   Level of Independence  Independent    Vocation  Full time employment student    Vocation Requirements  standing, lifting, walking, bending, jumping    Leisure  17 year old daughter      Cognition   Overall Cognitive Status  Within Functional Limits for tasks assessed      Observation/Other Assessments   Observations  keeps her legs constantly moving due to restless leg syndrome      Posture/Postural Control   Posture/Postural Control  No significant limitations    Posture Comments  lumbopelvic perturbations with leg movements      ROM / Strength   AROM / PROM / Strength  AROM;PROM;Strength      AROM   Lumbar Extension  decreased by 25% with pain    Lumbar - Right Side Bend  decreased by 50% with pain    Lumbar - Left Side Bend  decreased by 25% with pain      Strength   Right Hip Extension  3/5    Right Hip External Rotation   4/5    Right Hip Internal Rotation  4/5    Right  Hip ABduction  3/5    Right Hip ADduction  3+/5    Left Hip Extension  3/5    Left Hip External Rotation  4/5    Left Hip Internal Rotation  4/5    Left Hip ABduction  4/5      Palpation   SI assessment   right ilium is rotated anterior rotated, right pubic bone is lower    Palpation comment  significantly restricted abdominal scars over all quadrants tenderness located in bil. thighs, and lumbar reproduced pain w/ palpation along R medial ASIS       Special Tests    Special Tests  Sacrolliac Tests    Sacroiliac Tests   Pelvic Distraction      Pelvic Dictraction   Findings  Positive    Side   Right    Comment  pain      Transfers   Transfers  -- very guarded due to pain      Balance   Balance Assessed  -- increased body sway with standing on left leg                Objective measurements completed on examination: See above findings.    Pelvic Floor Special Questions - 02/11/18 0001    Prior Pregnancies  Yes    Number of Pregnancies  1    Number of Vaginal Deliveries  1    Currently Sexually Active  Yes    Is this Painful  Yes    Marinoff Scale  pain interrupts completion    Urinary Leakage  Yes    Activities that cause leaking  With strong urge    Fecal incontinence  No 7-15 times per day    Skin Integrity  Intact    Perineal Body/Introitus   Elevated    External Palpation  not able to bulge the pelvic floor    Pelvic Floor Internal Exam  Patient confirms identification and approves PT to assess the muscle integrity and treatment    Exam Type  Vaginal    Palpation  therapist is only able to touch the introitus due to pain  Strength  Flicker    Tone  increased               PT Education - 02/11/18 1130    Education provided  Yes    Education Details  I love U abdominal massage; scar massage    Person(s) Educated  Patient    Methods  Explanation;Demonstration;Verbal cues;Handout    Comprehension  Returned demonstration;Verbalized understanding        PT Short Term Goals - 02/11/18 1151      PT SHORT TERM GOAL #1   Title  Pt will show IND with HEP    Time  4    Period  Weeks    Status  New    Target Date  03/11/18      PT SHORT TERM GOAL #2   Title  Pt will demo decreased tenderness with palpation over areas of the abdomina scar in order to promote motility and  bowel movements    Time  4    Period  Weeks    Target Date  03/11/18      PT SHORT TERM GOAL #3   Title  Pt will demo increased diaphragmatic excursion and pelvic floor lengthening in order to promote improved toileting mechanics     Time  4    Period  Weeks    Status  New    Target Date  03/11/18      PT SHORT TERM GOAL #4   Title  understand how to toilet correctly to reduce straing and fully empty her bowels    Time  4    Period  Weeks    Status  New    Target Date  03/11/18        PT Long Term Goals - 02/11/18 1142      PT LONG TERM GOAL #1   Title  independent wtih HEP and understand how to progress herself    Time  12    Period  Weeks    Status  New    Target Date  05/06/18      PT LONG TERM GOAL #2   Title  pain with penetration improved >/= 75% due to improved tissue mobility    Time  12    Period  Weeks    Status  New    Target Date  05/06/18      PT LONG TERM GOAL #3   Title  improved abdominal scar mobilty so abdominal pain decreased >/= 75%     Time  12    Period  Weeks    Status  New    Target Date  05/06/18      PT LONG TERM GOAL #4   Title  pain with daily tasks decreased >/= 75% due to improved core and hip strength    Time  12    Period  Weeks    Status  New    Target Date  05/06/18      PT LONG TERM GOAL #5   Title  pain with vaginal exam decreased >/= 75% due to ability to relax the pelvic floor tissues and bulge    Time  12    Period  Weeks    Status  New    Target Date  05/06/18      Additional Long Term Goals   Additional Long Term Goals  Yes      PT LONG TERM GOAL #6   Title  bowel movements decreased  >/=  5 per day due to improve tissue mobility and reduction in scar tightness    Time  12    Period  Weeks    Status  New    Target Date  05/06/18             Plan - 02/11/18 1131    Clinical Impression Statement  Patient is a 32 year old female with myofascial pain dysfunction syndrome for the past 10 years.  Patient has had multiple abdominal surgeres with increased scar restrions and pain with palpation.  Patient reports she will have 7-15 bowel movements per day and can switch from diarrhea to firmness. Patient reports her abdominal and pelvic pain is 5-10 and not sure what aggravates it and reduces it. Patient has weakness in bilateral hips and core.  Right ilium is rotated anteriorly and right pubic bone is lower than left.  Positive distraction test on the right with pain.  Her transitional movements are guarded due to pain.. When patient stands on left she has increased body sway.  Lumbar ROM is limited.  Patient marinoff score is 2/3 due to intercourse being very painful. Patient will benefit from skilled therapy to reduce her pain, understand how to manage pain, improve tissue mobility and improve functional activity.     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, going to school for her degree and raising her 67 year old daughter    Clinical Decision Making  Moderate    Rehab Potential  Good    Clinical Impairments Affecting Rehab Potential  extensive intra-abdominal adhesive disease; history of recurrent ovarian cysts; IBS; constipation; mood disorder; myofascial abdominal/pelvic dysfunction    PT Frequency  2x / week    PT Duration  12 weeks    PT Treatment/Interventions   Biofeedback;Cryotherapy;Physicist, medical;Therapeutic exercise;Therapeutic activities;Neuromuscular re-education;Patient/family education;Manual techniques;Passive range of motion;Dry needling    PT Next Visit Plan  adominal scar release and soft tissue work, bil. thigh tissue rolling, hip stretches, diaphragmatic breathing    PT Home Exercise Plan  progress as needed    Consulted and Agree with Plan of Care  Patient       Patient will benefit from skilled therapeutic intervention in order to improve the following deficits and impairments:  Pain, Increased fascial restricitons, Decreased coordination, Decreased mobility, Decreased scar mobility, Increased muscle spasms, Impaired tone, Decreased activity tolerance, Decreased endurance, Decreased range of motion, Decreased strength, Decreased balance  Visit Diagnosis: Muscle weakness (generalized) - Plan: PT plan of care cert/re-cert  Other lack of coordination - Plan: PT plan of care cert/re-cert  Cramp and spasm - Plan: PT plan of care cert/re-cert     Problem List There are no active problems to display for this patient.   Eulis Foster, PT 02/11/18 11:55 AM   [facility] [address], [address] [place], [place], [address] Phone: [phone]   Fax:  [phone]  Name: Charlene Ferguson MRN: [id number] Date of Birth: [birth year]

## 2018-02-11 NOTE — Patient Instructions (Addendum)
ILU (I Love U) Massage This self-massage is used for constipation/loose bowels and generalized pelvic and abdominal pain Always do the massage from right to left, using soap in the shower, or cream on your fingertips Start by forming the letter "I" by stroking with moderate pressure from under the left ribcage down to the front of the left hipbone, 10x Next, form the letter "L" by stroking with moderate pressure from the right ribcage, underneath the ribcage to the left, and down to the front of the left hipbone, forming the letter "L" Last, do 10 strokes from the front of the right hipbone up to the right ribcage, across to the left ribcage, and down to the left hip bone, forming the letter "U" These strokes follow the path of the large intestine, helping to calm it if it is irritated, and increasing the motility (movement) of food in your gut Finish with 1-2 minutes of a clockwise circular massage 2-3 inches away from the belly button to stimulate the small intestine Do this massage 1-2x daily  Scar Massage  Scar massage is done to improve the mobility of scar, decrease scar tissue from building up, reduce adhesions, and prevent Keloids from forming. Start scar massage after scabs have fallen off by themselves and no open areas. The first few weeks after surgery, it is normal for a scar to appear pink or red and slightly raised. Scars can itch or have areas of numbness. Some scars may be sensitive.   Direct Scar massage: after scar is healed, no opening, no scab 1.  Place pads of two fingers together directly on the scar starting at one end of the scar. Move the fingers up and down across the scar holding 5 seconds one direction.  Then go opposite direction hold 5 seconds.  2. Move over to the next section of the scar and repeat.  Work your way along the entire length of the scar.   3. Next make diagonal movements along the scar holding 5 seconds at one direction. 4. Next movement is side to  side. 5. Do not rub fingers over the scar.  Instead keep firm pressure and move scar over the tissue it is on top   Scar Lift and Roll 12 weeks after surgery. 1. Pinch a small amount of the scar between your first two fingers and thumb.  2. Roll the scar between your fingers for 5 to 15 seconds. 3. Move along the scar and repeat until you have massaged the entire length of scar.   Stop the massage and call your doctor if you notice: 1. Increased redness 2. Bleeding from scar 3. Seepage coming from the scar 4. Scar is warmer and has increased pain   [facility] [address], [address] [place], [place] [address] Phone # [phone] Fax [phone]

## 2018-02-13 ENCOUNTER — Encounter: Payer: Self-pay | Admitting: Physical Therapy

## 2018-02-13 ENCOUNTER — Ambulatory Visit: Payer: Managed Care, Other (non HMO) | Admitting: Physical Therapy

## 2018-02-13 DIAGNOSIS — R278 Other lack of coordination: Secondary | ICD-10-CM

## 2018-02-13 DIAGNOSIS — M6281 Muscle weakness (generalized): Secondary | ICD-10-CM | POA: Diagnosis not present

## 2018-02-13 DIAGNOSIS — R252 Cramp and spasm: Secondary | ICD-10-CM

## 2018-02-13 NOTE — Therapy (Signed)
[facility] [address] [place], [place], [address] Phone: [phone]   Fax:  [phone]  Physical Therapy Treatment  Patient Details  Name: Charlene Ferguson MRN: [id number] Date of Birth: 10/01/1985 Referring Provider: Dr. Daleen Squibb Abu-Alnadi   Encounter Date: 02/13/2018  PT End of Session - 02/13/18 1201    Visit Number  2    Date for PT Re-Evaluation  05/06/18    Authorization Type  [employer]    PT Start Time  1155    PT Stop Time  1300    PT Time Calculation (min)  65 min    Activity Tolerance  Patient tolerated treatment well;Patient limited by pain    Behavior During Therapy  [facility] for tasks assessed/performed       Past Medical History:  Diagnosis Date  . Heart murmur   . Kidney stones   . Motion sickness    car back seat  . Ovarian cyst 2010   bilateral side. R cyst has been removed 2016  . SBO (small bowel obstruction) (HCC)    2010  . 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 ABDOMINAL SURGERY    . NASAL SEPTOPLASTY W/ TURBINOPLASTY Bilateral 11/21/2017   Procedure: NASAL SEPTOPLASTY WITH TURBINATE REDUCTION;  Surgeon: Vernie Murders, MD;  Location: [facility];  Service: ENT;  Laterality: Bilateral;  . OVARIAN CYST SURGERY    . SMALL INTESTINE SURGERY     at birth to remove volvulus    There were no vitals filed for this visit.  Subjective Assessment - 02/13/18 1155    Subjective  Tuesday my stomach is better and the next day was worse. I feel like my vaginal is being ripped open.  I feel like I am on my period but I am not.     Pertinent History  Hx of volvulus with 4 in of small intestines removed at birth, R ovarian cyst removed, small bowel obstruction.     Patient Stated Goals  stop pain and decrease constipation    Currently in Pain?  Yes    Pain Score  3     Pain Location   Abdomen    Pain Orientation  Mid    Pain Descriptors / Indicators  Spasm    Pain Type  Chronic pain    Pain Onset  More than a month ago    Pain Frequency  Constant    Aggravating Factors   nighttime    Pain Relieving Factors  not sure    Multiple Pain Sites  No                       [facility] Adult PT Treatment/Exercise - 02/13/18 0001      Modalities   Modalities  Electrical Stimulation;Moist Heat      Moist Heat Therapy   Number Minutes Moist Heat  20 Minutes    Moist Heat Location  Other (comment) abdominal, supine      Electrical Stimulation   Electrical Stimulation Location  abdominal    Electrical Stimulation Action  IFC    Electrical Stimulation Parameters  to patient tolerance, 20 minutes    Electrical Stimulation Goals  Pain      Manual Therapy   Manual Therapy  Soft tissue mobilization;Myofascial release    Manual therapy comments  lifting  the scar and iastim to abdominal scars    Soft tissue mobilization  suprapubically, abdominals, diaphgram    Myofascial Release  respiratory and urogenital diaphgram with one hand anterior and one posterior to relase through 3 planes of fascial             PT Education - 02/13/18 1239    Education provided  Yes    Education Details  stretches with pelvic drop; pelvic floor meditation; pelvic floor drop    Person(s) Educated  Patient    Methods  Explanation;Demonstration;Handout    Comprehension  Verbalized understanding;Returned demonstration       PT Short Term Goals - 02/11/18 1151      PT SHORT TERM GOAL #1   Title  Pt will show IND with HEP    Time  4    Period  Weeks    Status  New    Target Date  03/11/18      PT SHORT TERM GOAL #2   Title  Pt will demo decreased tenderness with palpation over areas of the abdomina scar in order to promote motility and  bowel movements    Time  4    Period  Weeks    Target Date  03/11/18      PT SHORT TERM GOAL #3   Title  Pt will demo increased  diaphragmatic excursion and pelvic floor lengthening in order to promote improved toileting mechanics     Time  4    Period  Weeks    Status  New    Target Date  03/11/18      PT SHORT TERM GOAL #4   Title  understand how to toilet correctly to reduce straing and fully empty her bowels    Time  4    Period  Weeks    Status  New    Target Date  03/11/18        PT Long Term Goals - 02/11/18 1142      PT LONG TERM GOAL #1   Title  independent wtih HEP and understand how to progress herself    Time  12    Period  Weeks    Status  New    Target Date  05/06/18      PT LONG TERM GOAL #2   Title  pain with penetration improved >/= 75% due to improved tissue mobility    Time  12    Period  Weeks    Status  New    Target Date  05/06/18      PT LONG TERM GOAL #3   Title  improved abdominal scar mobilty so abdominal pain decreased >/= 75%     Time  12    Period  Weeks    Status  New    Target Date  05/06/18      PT LONG TERM GOAL #4   Title  pain with daily tasks decreased >/= 75% due to improved core and hip strength    Time  12    Period  Weeks    Status  New    Target Date  05/06/18      PT LONG TERM GOAL #5   Title  pain with vaginal exam decreased >/= 75% due to ability to relax the pelvic floor tissues and bulge    Time  12    Period  Weeks    Status  New    Target Date  05/06/18  Additional Long Term Goals   Additional Long Term Goals  Yes      PT LONG TERM GOAL #6   Title  bowel movements decreased >/= 5 per day due to improve tissue mobility and reduction in scar tightness    Time  12    Period  Weeks    Status  New    Target Date  05/06/18            Plan - 02/13/18 1243    Clinical Impression Statement  After manual work improved tissue mobility of scars and abdominals. Patient reported one side of pelvic floor is relaxed and other is irritated. Patient was able to keep her legs relaxed with soft tissue work. Patient abdominals were sore after  therapy due to the soft tissue work.  Patient will benefit from skilled therapy to reduce her pain, understand how to manage pain, improve tissue mobility and improve functional activitiy    Rehab Potential  Good    Clinical Impairments Affecting Rehab Potential  extensive intra-abdominal adhesive disease; history of recurrent ovarian cysts; IBS; constipation; mood disorder; myofascial abdominal/pelvic dysfunction    PT Frequency  2x / week    PT Duration  12 weeks    PT Treatment/Interventions  Biofeedback;Cryotherapy;Physicist, medical;Therapeutic exercise;Therapeutic activities;Neuromuscular re-education;Patient/family education;Manual techniques;Passive range of motion;Dry needling    PT Next Visit Plan  adominal scar release and soft tissue work, bil. thigh tissue rolling, hip stretches, diaphragmatic breathing    PT Home Exercise Plan  progress as needed    Consulted and Agree with Plan of Care  Patient       Patient will benefit from skilled therapeutic intervention in order to improve the following deficits and impairments:  Pain, Increased fascial restricitons, Decreased coordination, Decreased mobility, Decreased scar mobility, Increased muscle spasms, Impaired tone, Decreased activity tolerance, Decreased endurance, Decreased range of motion, Decreased strength, Decreased balance  Visit Diagnosis: Muscle weakness (generalized)  Other lack of coordination  Cramp and spasm     Problem List There are no active problems to display for this patient.   Eulis Foster, PT 02/13/18 12:53 PM   [facility] [address] [place], [place], [address] Phone: [phone]   Fax:  [phone]  Name: ANNALEA ALGUIRE MRN: [id number] Date of Birth: [birth year]

## 2018-02-13 NOTE — Patient Instructions (Addendum)
    youtube Guided Meditation for Pelvic Floor Relaxation  [employer]  Do daily [employer]   The "Pelvic Drop" to Release Pelvic Floor Tension: Three Visualizations do during the day Pelvic Floor Release Stretches  [employer] do these daily  Butterfly, Supine    Lie on back, feet together. Lower knees toward floor. Hold _60__ seconds. Repeat __1_ times per session. Do _1__ sessions per day. Breath into your stomach expanding it then breath out as you feel the vagina open up and relax.  Copyright  VHI. All rights reserved.  [facility] [address], [address] [place], [place] [address] Phone # [phone] Fax [phone]

## 2018-02-19 ENCOUNTER — Encounter: Payer: Self-pay | Admitting: Physical Therapy

## 2018-02-19 ENCOUNTER — Ambulatory Visit: Payer: Managed Care, Other (non HMO) | Admitting: Physical Therapy

## 2018-02-19 DIAGNOSIS — R278 Other lack of coordination: Secondary | ICD-10-CM

## 2018-02-19 DIAGNOSIS — R252 Cramp and spasm: Secondary | ICD-10-CM

## 2018-02-19 DIAGNOSIS — R2689 Other abnormalities of gait and mobility: Secondary | ICD-10-CM

## 2018-02-19 DIAGNOSIS — M6281 Muscle weakness (generalized): Secondary | ICD-10-CM | POA: Diagnosis not present

## 2018-02-19 DIAGNOSIS — R293 Abnormal posture: Secondary | ICD-10-CM

## 2018-02-19 NOTE — Therapy (Signed)
[facility] [address] [place], [place], [address] Phone: [phone]   Fax:  [phone]  Physical Therapy Treatment  Patient Details  Name: Charlene Ferguson MRN: [id number] Date of Birth: Jan 24, 1986 Referring Provider: Dr. Daleen Squibb Abu-Alnadi   Encounter Date: 02/19/2018  PT End of Session - 02/19/18 1656    Visit Number  3    Date for PT Re-Evaluation  05/06/18    Authorization Type  [employer]igna    PT Start Time  1615    PT Stop Time  1705    PT Time Calculation (min)  50 min    Activity Tolerance  Patient tolerated treatment well;Patient limited by pain    Behavior During Therapy  [facility] for tasks assessed/performed       Past Medical History:  Diagnosis Date  . Heart murmur   . Kidney stones   . Motion sickness    car back seat  . Ovarian cyst 2010   bilateral side. R cyst has been removed 2016  . SBO (small bowel obstruction) (HCC)    2010  . 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 ABDOMINAL SURGERY    . NASAL SEPTOPLASTY W/ TURBINOPLASTY Bilateral 11/21/2017   Procedure: NASAL SEPTOPLASTY WITH TURBINATE REDUCTION;  Surgeon: Vernie Murders, MD;  Location: [facility];  Service: ENT;  Laterality: Bilateral;  . OVARIAN CYST SURGERY    . SMALL INTESTINE SURGERY     at birth to remove volvulus    There were no vitals filed for this visit.  Subjective Assessment - 02/19/18 1617    Subjective  My pain is tolerable.  I feel like my stomach is part of me now. My pain at night is 50% better. I have not felt like my vaginal has been ripped open. My constipation has been better. I feel more energized.     Pertinent History  Hx of volvulus with 4 in of small intestines removed at birth, R ovarian cyst removed, small bowel obstruction.     Patient Stated Goals  stop pain and decrease constipation    Currently in Pain?  Yes    Pain Score  2     Pain Location  Abdomen    Pain Orientation  Mid    Pain Descriptors / Indicators  Spasm    Pain Type  Chronic pain    Pain Onset  More than a month ago    Pain Frequency  Constant    Aggravating Factors   nighttime    Pain Relieving Factors  not sure    Multiple Pain Sites  No                       [facility] Adult PT Treatment/Exercise - 02/19/18 0001      Modalities   Modalities  Electrical Stimulation;Moist Heat      Moist Heat Therapy   Number Minutes Moist Heat  15 Minutes    Moist Heat Location  Other (comment) abdominal, supine      Electrical Stimulation   Electrical Stimulation Location  abdominal    Electrical Stimulation Action  IFC    Electrical Stimulation Parameters  to patient tolerance , 15 min    Electrical Stimulation Goals  Pain      Manual Therapy   Manual Therapy  Soft tissue mobilization;Myofascial  release    Manual therapy comments  lifting the scar and iastim to abdominal scars    Soft tissue mobilization  diaphgram in supine and sitting; around the umbilicus, upper abdominal area             PT Education - 02/19/18 1650    Education provided  Yes    Education Details  stretches and diaphragmatic breathing;  Access Code: [email or web address]    Person(s) Educated  Patient    Methods  Explanation;Demonstration;Verbal cues;Handout    Comprehension  Returned demonstration;Verbalized understanding       PT Short Term Goals - 02/19/18 1657      PT SHORT TERM GOAL #1   Title  Pt will show IND with HEP    Time  4    Period  Weeks    Status  Achieved        PT Long Term Goals - 02/11/18 1142      PT LONG TERM GOAL #1   Title  independent wtih HEP and understand how to progress herself    Time  12    Period  Weeks    Status  New    Target Date  05/06/18      PT LONG TERM GOAL #2   Title  pain with penetration improved >/= 75% due to improved tissue mobility    Time  12    Period  Weeks     Status  New    Target Date  05/06/18      PT LONG TERM GOAL #3   Title  improved abdominal scar mobilty so abdominal pain decreased >/= 75%     Time  12    Period  Weeks    Status  New    Target Date  05/06/18      PT LONG TERM GOAL #4   Title  pain with daily tasks decreased >/= 75% due to improved core and hip strength    Time  12    Period  Weeks    Status  New    Target Date  05/06/18      PT LONG TERM GOAL #5   Title  pain with vaginal exam decreased >/= 75% due to ability to relax the pelvic floor tissues and bulge    Time  12    Period  Weeks    Status  New    Target Date  05/06/18      Additional Long Term Goals   Additional Long Term Goals  Yes      PT LONG TERM GOAL #6   Title  bowel movements decreased >/= 5 per day due to improve tissue mobility and reduction in scar tightness    Time  12    Period  Weeks    Status  New    Target Date  05/06/18            Plan - 02/19/18 1652    Clinical Impression Statement  Patient night time pain has decreased by 50%.  Patient does not have the sharp vaginal pain.  Patient reports she feels like her abdomen is part of her body for the first time.  Scars have 50% mobility.  The diaphgram is tight but after therapy able to expand the abdomen. Patient is doing her HEP.  Patient will benefit from skilled therapy to reduce her pain, understand how to manage pain, improve tissue mobility and improve functional activity.     Rehab Potential  Good    Clinical Impairments Affecting Rehab Potential  extensive intra-abdominal adhesive disease; history of recurrent ovarian cysts; IBS; constipation; mood disorder; myofascial abdominal/pelvic dysfunction    PT Frequency  2x / week    PT Duration  12 weeks    PT Treatment/Interventions  Biofeedback;Cryotherapy;Physicist, medical;Therapeutic exercise;Therapeutic activities;Neuromuscular re-education;Patient/family education;Manual  techniques;Passive range of motion;Dry needling    PT Next Visit Plan  adominal scar release and soft tissue work inner thigh and possible pelvic floor,, bil. thigh tissue rolling,,    PT Home Exercise Plan  Access Code: [email or web address]    Consulted and Agree with Plan of Care  Patient       Patient will benefit from skilled therapeutic intervention in order to improve the following deficits and impairments:  Pain, Increased fascial restricitons, Decreased coordination, Decreased mobility, Decreased scar mobility, Increased muscle spasms, Impaired tone, Decreased activity tolerance, Decreased endurance, Decreased range of motion, Decreased strength, Decreased balance  Visit Diagnosis: Muscle weakness (generalized)  Other lack of coordination  Cramp and spasm  Other abnormalities of gait and mobility  Abnormal posture     Problem List There are no active problems to display for this patient.   Eulis Foster, PT 02/19/18 4:58 PM   [facility] [address] [place], [place], [address] Phone: [phone]   Fax:  [phone]  Name: CHRISLYNN MOSELY MRN: [id number] Date of Birth: 23-Oct-1985

## 2018-02-19 NOTE — Patient Instructions (Signed)
Access Code: [email or web address]  URL: [email or web address]  Date: 02/19/2018  Prepared by: Eulis Foster   Exercises  Seated Piriformis Stretch with Trunk Bend - 12 reps - 1 sets - 30 hold - 1x daily - 7x weekly  Seated Hamstring Stretch - 2 reps - 1 sets - 30 hold - 1x daily - 7x weekly  V Sit Hip Adductor Hamstring Stretch - 2 reps - 1 sets - 1 min hold - 1x daily - 7x weekly  Cat Cow - 10 reps - 1 sets - 1x daily - 7x weekly  Child's Pose with Sidebending - 1 reps - 1 sets - 30 hold - 1x daily - 7x weekly  Static Prone on Elbows - 2 reps - 1 sets - 30 hold - 1x daily - 7x weekly  Supine Diaphragmatic Breathing - 10 reps - 1 sets - 5 hold - 1x daily - 7x weekly  [facility] [address], [address] [place], [place] [address] Phone # [phone] Fax [phone]

## 2018-02-24 ENCOUNTER — Encounter: Payer: Self-pay | Admitting: Physical Therapy

## 2018-02-24 ENCOUNTER — Ambulatory Visit: Payer: Managed Care, Other (non HMO) | Attending: Obstetrics & Gynecology | Admitting: Physical Therapy

## 2018-02-24 DIAGNOSIS — R278 Other lack of coordination: Secondary | ICD-10-CM | POA: Diagnosis present

## 2018-02-24 DIAGNOSIS — R2689 Other abnormalities of gait and mobility: Secondary | ICD-10-CM | POA: Diagnosis present

## 2018-02-24 DIAGNOSIS — R252 Cramp and spasm: Secondary | ICD-10-CM | POA: Diagnosis present

## 2018-02-24 DIAGNOSIS — M6281 Muscle weakness (generalized): Secondary | ICD-10-CM | POA: Diagnosis not present

## 2018-02-24 DIAGNOSIS — R293 Abnormal posture: Secondary | ICD-10-CM

## 2018-02-24 NOTE — Therapy (Signed)
[facility] [address] [place], [place], [address] Phone: [phone]   Fax:  [phone]  Physical Therapy Treatment  Patient Details  Name: Charlene Ferguson MRN: [id number] Date of Birth: [birth year] Referring Provider: Dr. Daleen Squibb Abu-Alnadi   Encounter Date: 02/24/2018  PT End of Session - 02/24/18 1351    Visit Number  4    Date for PT Re-Evaluation  05/06/18    Authorization Type  Cigna    PT Start Time  1230    PT Stop Time  1325    PT Time Calculation (min)  55 min    Activity Tolerance  Patient tolerated treatment well;Patient limited by pain    Behavior During Therapy  [facility] for tasks assessed/performed       Past Medical History:  Diagnosis Date  . Heart murmur   . Kidney stones   . Motion sickness    car back seat  . Ovarian cyst 2010   bilateral side. R cyst has been removed 2016  . SBO (small bowel obstruction) (HCC)    2010  . 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 ABDOMINAL SURGERY    . NASAL SEPTOPLASTY W/ TURBINOPLASTY Bilateral 11/21/2017   Procedure: NASAL SEPTOPLASTY WITH TURBINATE REDUCTION;  Surgeon: Vernie Murders, MD;  Location: [facility];  Service: ENT;  Laterality: Bilateral;  . OVARIAN CYST SURGERY    . SMALL INTESTINE SURGERY     at birth to remove volvulus    There were no vitals filed for this visit.  Subjective Assessment - 02/24/18 1232    Subjective  I have pain from the right abdominal to the right low back that started Saturday morning. I felt good after last visit.     Pertinent History  Hx of volvulus with 4 in of small intestines removed at birth, R ovarian cyst removed, small bowel obstruction.     Patient Stated Goals  stop pain and decrease constipation    Currently in Pain?  Yes    Pain Score  8     Pain Location  Abdomen    Pain Orientation   Right    Pain Descriptors / Indicators  Sore;Spasm    Pain Type  Chronic pain    Pain Onset  In the past 7 days    Pain Frequency  Several days a week    Aggravating Factors   not sure    Pain Relieving Factors  not sure    Multiple Pain Sites  No         [facility] PT Assessment - 02/24/18 0001      AROM   Lumbar Flexion  decreased by 50%, decreased motion at L3-L5    Lumbar - Right Side Bend  decreased by 25% with pain      Palpation   SI assessment   right ilium is rotated anteriorly      Special Tests    Special Tests  Sacrolliac Tests    Sacroiliac Tests   Pelvic Distraction      Pelvic Dictraction   Findings  Positive    Side   Right    Comment  pain                   [facility] Adult PT Treatment/Exercise - 02/24/18 0001      Modalities  Modalities  Electrical Stimulation;Moist Heat      Moist Heat Therapy   Number Minutes Moist Heat  20 Minutes    Moist Heat Location  Lumbar Spine prone      Electrical Stimulation   Electrical Stimulation Location  lumbar prone    Electrical Stimulation Action  IFC    Electrical Stimulation Parameters  to patient tolerance, 20 minutes    Electrical Stimulation Goals  Pain      Manual Therapy   Manual Therapy  Muscle Energy Technique;Soft tissue mobilization    Soft tissue mobilization  right quadratus, iliopsoas, right lumbar paraspinals and diaphragm    Muscle Energy Technique  correct right ilium       Trigger Point Dry Needling - 02/24/18 1350    Muscles Treated Upper Body  -- electrical stim to T10 and T11 on right to tolerance 5 minut           PT Education - 02/24/18 1351    Education provided  Yes    Education Details  information on dry needling    Person(s) Educated  Patient    Methods  Explanation;Handout    Comprehension  Verbalized understanding       PT Short Term Goals - 02/19/18 1657      PT SHORT TERM GOAL #1   Title  Pt will show IND with HEP    Time  4    Period  Weeks    Status   Achieved        PT Long Term Goals - 02/11/18 1142      PT LONG TERM GOAL #1   Title  independent wtih HEP and understand how to progress herself    Time  12    Period  Weeks    Status  New    Target Date  05/06/18      PT LONG TERM GOAL #2   Title  pain with penetration improved >/= 75% due to improved tissue mobility    Time  12    Period  Weeks    Status  New    Target Date  05/06/18      PT LONG TERM GOAL #3   Title  improved abdominal scar mobilty so abdominal pain decreased >/= 75%     Time  12    Period  Weeks    Status  New    Target Date  05/06/18      PT LONG TERM GOAL #4   Title  pain with daily tasks decreased >/= 75% due to improved core and hip strength    Time  12    Period  Weeks    Status  New    Target Date  05/06/18      PT LONG TERM GOAL #5   Title  pain with vaginal exam decreased >/= 75% due to ability to relax the pelvic floor tissues and bulge    Time  12    Period  Weeks    Status  New    Target Date  05/06/18      Additional Long Term Goals   Additional Long Term Goals  Yes      PT LONG TERM GOAL #6   Title  bowel movements decreased >/= 5 per day due to improve tissue mobility and reduction in scar tightness    Time  12    Period  Weeks    Status  New    Target Date  05/06/18  Plan - 02/24/18 1352    Clinical Impression Statement  Patient has a flare- up on the right side  that is muscular.  Patient started to have a bad strain on the right side that started at 9/10 and after therapy went to 5/10. Patient has improved tissue mobility of the abdominals.  Patient will benefi tfrom skilled therapy to reduce her pain, understand how to manage pain, improve tissue mobility and improve functional activity.     Rehab Potential  Good    Clinical Impairments Affecting Rehab Potential  extensive intra-abdominal adhesive disease; history of recurrent ovarian cysts; IBS; constipation; mood disorder; myofascial abdominal/pelvic  dysfunction    PT Frequency  2x / week    PT Duration  12 weeks    PT Treatment/Interventions  Biofeedback;Cryotherapy;Physicist, medicallectrical Stimulation;Moist Heat;Ultrasound;Balance training;Therapeutic exercise;Therapeutic activities;Neuromuscular re-education;Patient/family education;Manual techniques;Passive range of motion;Dry needling    PT Next Visit Plan  adominal scar release and soft tissue work inner thigh and possible pelvic floor,, bil. thigh tissue rolling,,    Consulted and Agree with Plan of Care  Patient       Patient will benefit from skilled therapeutic intervention in order to improve the following deficits and impairments:  Pain, Increased fascial restricitons, Decreased coordination, Decreased mobility, Decreased scar mobility, Increased muscle spasms, Impaired tone, Decreased activity tolerance, Decreased endurance, Decreased range of motion, Decreased strength, Decreased balance  Visit Diagnosis: Muscle weakness (generalized)  Other lack of coordination  Cramp and spasm  Other abnormalities of gait and mobility  Abnormal posture     Problem List There are no active problems to display for this patient.   Eulis FosterCheryl Zamaya Rapaport, PT 02/24/18 1:55 PM   [facility] [address] [place], [place], [address] Phone: [phone]   Fax:  [phone]  Name: Charlene Ferguson MRN: [id number] Date of Birth: [birth year]

## 2018-02-24 NOTE — Patient Instructions (Signed)

## 2018-02-26 ENCOUNTER — Encounter: Payer: 59 | Admitting: Physical Therapy

## 2018-03-03 ENCOUNTER — Ambulatory Visit: Payer: Managed Care, Other (non HMO) | Admitting: Physical Therapy

## 2018-03-03 ENCOUNTER — Encounter: Payer: Self-pay | Admitting: Physical Therapy

## 2018-03-03 DIAGNOSIS — R278 Other lack of coordination: Secondary | ICD-10-CM

## 2018-03-03 DIAGNOSIS — R252 Cramp and spasm: Secondary | ICD-10-CM

## 2018-03-03 DIAGNOSIS — M6281 Muscle weakness (generalized): Secondary | ICD-10-CM | POA: Diagnosis not present

## 2018-03-03 NOTE — Therapy (Signed)
[facility] [address], [address] [place], [place], [address] Phone: [phone]   Fax:  [phone]  Physical Therapy Treatment  Patient Details  Name: Charlene CapersJuana H Haydel MRN: [id number] Date of Birth: 11-Feb-1986 Referring Provider: Dr. Daleen SquibbNoor Dasouki Abu-Alnadi   Encounter Date: 03/03/2018  PT End of Session - 03/03/18 1658    Visit Number  5    Date for PT Re-Evaluation  05/06/18    Authorization Type  [employer]    PT Start Time  1615    PT Stop Time  1655    PT Time Calculation (min)  40 min    Activity Tolerance  Patient tolerated treatment well;Patient limited by pain    Behavior During Therapy  [facility] for tasks assessed/performed       Past Medical History:  Diagnosis Date  . Heart murmur   . Kidney stones   . Motion sickness    car back seat  . Ovarian cyst 2010   bilateral side. R cyst has been removed 2016  . SBO (small bowel obstruction) (HCC)    2010  . 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 ABDOMINAL SURGERY    . NASAL SEPTOPLASTY W/ TURBINOPLASTY Bilateral 11/21/2017   Procedure: NASAL SEPTOPLASTY WITH TURBINATE REDUCTION;  Surgeon: Vernie MurdersJuengel, Paul, MD;  [facility];  Service: ENT;  Laterality: Bilateral;  . OVARIAN CYST SURGERY    . SMALL INTESTINE SURGERY     at birth to remove volvulus    There were no vitals filed for this visit.  Subjective Assessment - 03/03/18 1617    Subjective  Constipation 50% better. Saturday I was spotting so I was having alot of pain.     Pertinent History  Hx of volvulus with 4 in of small intestines removed at birth, R ovarian cyst removed, small bowel obstruction.     Patient Stated Goals  stop pain and decrease constipation    Currently in Pain?  Yes    Pain Score  3     Pain Location  Abdomen    Pain Orientation  Right    Pain Descriptors / Indicators   Spasm;Sore    Pain Type  Chronic pain    Pain Onset  In the past 7 days    Pain Frequency  Several days a week    Aggravating Factors   not sure    Pain Relieving Factors  not sure    Multiple Pain Sites  No                       [facility] Adult PT Treatment/Exercise - 03/03/18 0001      Lumbar Exercises: Stretches   Other Lumbar Stretch Exercise  hip adductor stretch in sitting then bring arm to the side      Lumbar Exercises: Quadruped   Single Arm Raise  Right;Left;15 reps;1 second      Manual Therapy   Manual Therapy  Soft tissue mobilization;Myofascial release    Manual therapy comments  rolling of the inner thigh with prickly roller    Soft tissue mobilization  scar massage to abdoment; bil. hip adductor, pectineus, hamstring    Myofascial Release  pulling the perineum downward to release the fascia; move the hip up and down to release the hips  PT Education - 03/03/18 1655    Education provided  Yes    Education Details  Access Code: [email or web address]     Person(s) Educated  Patient    Methods  Explanation;Handout;Demonstration    Comprehension  Verbalized understanding;Returned demonstration       PT Short Term Goals - 03/03/18 1701      PT SHORT TERM GOAL #2   Title  Pt will demo decreased tenderness with palpation over areas of the abdomina scar in order to promote motility and  bowel movements    Time  4    Period  Weeks    Status  Achieved      PT SHORT TERM GOAL #3   Title  Pt will demo increased diaphragmatic excursion and pelvic floor lengthening in order to promote improved toileting mechanics     Time  4    Period  Weeks    Status  Achieved      PT SHORT TERM GOAL #4   Title  understand how to toilet correctly to reduce straing and fully empty her bowels    Time  4    Period  Weeks    Status  Achieved        PT Long Term Goals - 02/11/18 1142      PT LONG TERM GOAL #1   Title  independent wtih HEP and understand how to  progress herself    Time  12    Period  Weeks    Status  New    Target Date  05/06/18      PT LONG TERM GOAL #2   Title  pain with penetration improved >/= 75% due to improved tissue mobility    Time  12    Period  Weeks    Status  New    Target Date  05/06/18      PT LONG TERM GOAL #3   Title  improved abdominal scar mobilty so abdominal pain decreased >/= 75%     Time  12    Period  Weeks    Status  New    Target Date  05/06/18      PT LONG TERM GOAL #4   Title  pain with daily tasks decreased >/= 75% due to improved core and hip strength    Time  12    Period  Weeks    Status  New    Target Date  05/06/18      PT LONG TERM GOAL #5   Title  pain with vaginal exam decreased >/= 75% due to ability to relax the pelvic floor tissues and bulge    Time  12    Period  Weeks    Status  New    Target Date  05/06/18      Additional Long Term Goals   Additional Long Term Goals  Yes      PT LONG TERM GOAL #6   Title  bowel movements decreased >/= 5 per day due to improve tissue mobility and reduction in scar tightness    Time  12    Period  Weeks    Status  New    Target Date  05/06/18            Plan - 03/03/18 1619    Clinical Impression Statement  Patient has decreased abdominal scar mobility.  Patient reports her abdominal pain decreased.  Patient has tenderness in abdominal after therapy due to exercising the abdomin.  Patient has increased tightness on the right hip adductors. constipation improved by 50%. Patient will benefit from skilled therapy to reduce her pain, understand how to manage pain, improve tissue mobility and improve funcitonal activitiy.     Rehab Potential  Good    Clinical Impairments Affecting Rehab Potential  extensive intra-abdominal adhesive disease; history of recurrent ovarian cysts; IBS; constipation; mood disorder; myofascial abdominal/pelvic dysfunction    PT Frequency  2x / week    PT Duration  12 weeks    PT Treatment/Interventions   Biofeedback;Cryotherapy;Physicist, medical;Therapeutic exercise;Therapeutic activities;Neuromuscular re-education;Patient/family education;Manual techniques;Passive range of motion;Dry needling    PT Next Visit Plan  adominal scar release and soft tissue work inner thigh and possible pelvic floor,, bil. thigh tissue rolling,,    PT Home Exercise Plan  Access Code: [email or web address]    Consulted and Agree with Plan of Care  Patient       Patient will benefit from skilled therapeutic intervention in order to improve the following deficits and impairments:  Pain, Increased fascial restricitons, Decreased coordination, Decreased mobility, Decreased scar mobility, Increased muscle spasms, Impaired tone, Decreased activity tolerance, Decreased endurance, Decreased range of motion, Decreased strength, Decreased balance  Visit Diagnosis: Muscle weakness (generalized)  Other lack of coordination  Cramp and spasm     Problem List There are no active problems to display for this patient.   Eulis Foster, PT 03/03/18 5:02 PM   [facility] [address] [place], [place], [address] Phone: [phone]   Fax:  [phone]  Name: SALIA CANGEMI MRN: [id number] Date of Birth: [birth year]

## 2018-03-03 NOTE — Patient Instructions (Signed)
Access Code: [email or web address]  URL: [email or web address]  Date: 03/03/2018  Prepared by: Eulis Fosterheryl Gray   Exercises  Seated Piriformis Stretch with Trunk Bend - 12 reps - 1 sets - 30 hold - 1x daily - 7x weekly  Seated Hamstring Stretch - 2 reps - 1 sets - 30 hold - 1x daily - 7x weekly  V Sit Hip Adductor Hamstring Stretch - 2 reps - 1 sets - 1 min hold - 1x daily - 7x weekly  Cat Cow - 10 reps - 1 sets - 1x daily - 7x weekly  Child's Pose with Sidebending - 1 reps - 1 sets - 30 hold - 1x daily - 7x weekly  Static Prone on Elbows - 2 reps - 1 sets - 30 hold - 1x daily - 7x weekly  Supine Diaphragmatic Breathing - 10 reps - 1 sets - 5 hold - 1x daily - 7x weekly  Beginner Front Arm Support - 10 reps - 1 sets - 1x daily - 7x weekly  Quadruped Alternating Arm Lift - 10 reps - 1 sets - 1x daily - 7x weekly  [facility] [address], [address] [place], [place] [address] Phone # [phone] Fax [phone]

## 2018-03-05 ENCOUNTER — Encounter: Payer: Self-pay | Admitting: Physical Therapy

## 2018-03-05 ENCOUNTER — Ambulatory Visit: Payer: Managed Care, Other (non HMO) | Admitting: Physical Therapy

## 2018-03-05 DIAGNOSIS — R278 Other lack of coordination: Secondary | ICD-10-CM

## 2018-03-05 DIAGNOSIS — R293 Abnormal posture: Secondary | ICD-10-CM

## 2018-03-05 DIAGNOSIS — R252 Cramp and spasm: Secondary | ICD-10-CM

## 2018-03-05 DIAGNOSIS — R2689 Other abnormalities of gait and mobility: Secondary | ICD-10-CM

## 2018-03-05 DIAGNOSIS — M6281 Muscle weakness (generalized): Secondary | ICD-10-CM

## 2018-03-05 NOTE — Therapy (Signed)
[facility] [address], [address] [place], [place], [address] Phone: [phone]   Fax:  [phone]  Physical Therapy Treatment  Patient Details  Name: Charlene Ferguson MRN: [id number] Date of Birth: [birth year] Referring Provider: Dr. Daleen SquibbNoor Dasouki Abu-Alnadi   Encounter Date: 03/05/2018  PT End of Session - 03/05/18 1631    Visit Number  6    Date for PT Re-Evaluation  05/06/18    Authorization Type  [employer]    PT Start Time  1530    PT Stop Time  1625    PT Time Calculation (min)  55 min    Activity Tolerance  Patient tolerated treatment well;Patient limited by pain    Behavior During Therapy  [facility] for tasks assessed/performed       Past Medical History:  Diagnosis Date  . Heart murmur   . Kidney stones   . Motion sickness    car back seat  . Ovarian cyst 2010   bilateral side. R cyst has been removed 2016  . SBO (small bowel obstruction) (HCC)    2010  . 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 ABDOMINAL SURGERY    . NASAL SEPTOPLASTY W/ TURBINOPLASTY Bilateral 11/21/2017   Procedure: NASAL SEPTOPLASTY WITH TURBINATE REDUCTION;  Surgeon: Vernie MurdersJuengel, Paul, MD;  [facility];  Service: ENT;  Laterality: Bilateral;  . OVARIAN CYST SURGERY    . SMALL INTESTINE SURGERY     at birth to remove volvulus    There were no vitals filed for this visit.  Subjective Assessment - 03/05/18 1538    Subjective  My neck pain affects my stomach.     Pertinent History  Hx of volvulus with 4 in of small intestines removed at birth, R ovarian cyst removed, small bowel obstruction.     Patient Stated Goals  stop pain and decrease constipation    Currently in Pain?  Yes    Pain Score  3     Pain Location  Abdomen    Pain Orientation  Right;Mid    Pain Descriptors / Indicators  Sore;Spasm    Pain Type  Chronic pain    Pain Onset  In the past 7 days    Pain Frequency  Several days a week    Aggravating Factors   when has pain in other areas of the body    Pain Relieving Factors  not sure    Multiple Pain Sites  Yes    Pain Score  6    Pain Location  Neck    Pain Orientation  Mid    Pain Descriptors / Indicators  Tightness    Pain Type  Chronic pain    Pain Radiating Towards  makes entire body hurt    Pain Onset  More than a month ago    Pain Frequency  Intermittent    Aggravating Factors   movement, arm movement    Pain Relieving Factors  rest                       [facility] Adult PT Treatment/Exercise - 03/05/18 0001      Manual Therapy   Manual Therapy  Soft tissue mobilization    Soft tissue mobilization  subocciptials, cervical paraspinals, SCM, pterygoid, scalenes, around the tempermadibular joint, and upper trap to reduce headache and  abominal pain             PT Education - 03/05/18 1620    Education provided  Yes    Education Details  Access Code: [email or web address]     Person(s) Educated  Patient    Methods  Explanation;Demonstration;Verbal cues;Handout    Comprehension  Returned demonstration;Verbalized understanding       PT Short Term Goals - 03/03/18 1701      PT SHORT TERM GOAL #2   Title  Pt will demo decreased tenderness with palpation over areas of the abdomina scar in order to promote motility and  bowel movements    Time  4    Period  Weeks    Status  Achieved      PT SHORT TERM GOAL #3   Title  Pt will demo increased diaphragmatic excursion and pelvic floor lengthening in order to promote improved toileting mechanics     Time  4    Period  Weeks    Status  Achieved      PT SHORT TERM GOAL #4   Title  understand how to toilet correctly to reduce straing and fully empty her bowels    Time  4    Period  Weeks    Status  Achieved        PT Long Term Goals - 02/11/18 1142      PT LONG TERM GOAL #1   Title  independent wtih HEP and understand how to  progress herself    Time  12    Period  Weeks    Status  New    Target Date  05/06/18      PT LONG TERM GOAL #2   Title  pain with penetration improved >/= 75% due to improved tissue mobility    Time  12    Period  Weeks    Status  New    Target Date  05/06/18      PT LONG TERM GOAL #3   Title  improved abdominal scar mobilty so abdominal pain decreased >/= 75%     Time  12    Period  Weeks    Status  New    Target Date  05/06/18      PT LONG TERM GOAL #4   Title  pain with daily tasks decreased >/= 75% due to improved core and hip strength    Time  12    Period  Weeks    Status  New    Target Date  05/06/18      PT LONG TERM GOAL #5   Title  pain with vaginal exam decreased >/= 75% due to ability to relax the pelvic floor tissues and bulge    Time  12    Period  Weeks    Status  New    Target Date  05/06/18      Additional Long Term Goals   Additional Long Term Goals  Yes      PT LONG TERM GOAL #6   Title  bowel movements decreased >/= 5 per day due to improve tissue mobility and reduction in scar tightness    Time  12    Period  Weeks    Status  New    Target Date  05/06/18            Plan - 03/05/18 1608    Clinical Impression Statement  Patient has cervical pain and headache that will intensify her abdominal pain.  Patient has been flexed at her trunk with decreased mobility of abdominal scars and pelvic pain she was not able to stand upright increasing her cervical pain.  Patient needs to elongate her cervical and relax her jaw to improve her posture and decrease her adominal pain.  When patient clenches her jaw there is increased in pelvic and abdominal pain.  Patient is starting to work on postural strength to improve posture and reduce pain.     Rehab Potential  Good    Clinical Impairments Affecting Rehab Potential  extensive intra-abdominal adhesive disease; history of recurrent ovarian cysts; IBS; constipation; mood disorder; myofascial  abdominal/pelvic dysfunction    PT Frequency  2x / week    PT Duration  12 weeks    PT Treatment/Interventions  Biofeedback;Cryotherapy;Physicist, medical;Therapeutic exercise;Therapeutic activities;Neuromuscular re-education;Patient/family education;Manual techniques;Passive range of motion;Dry needling    PT Next Visit Plan  adominal scar release and soft tissue work inner thigh and possible pelvic floor,, bil. thigh tissue rolling,,postural strength    PT Home Exercise Plan  Access Code: [email or web address]    Recommended Other Services  second request sent to MD to sign on 03/05/2018    Consulted and Agree with Plan of Care  Patient       Patient will benefit from skilled therapeutic intervention in order to improve the following deficits and impairments:  Pain, Increased fascial restricitons, Decreased coordination, Decreased mobility, Decreased scar mobility, Increased muscle spasms, Impaired tone, Decreased activity tolerance, Decreased endurance, Decreased range of motion, Decreased strength, Decreased balance  Visit Diagnosis: Muscle weakness (generalized)  Other lack of coordination  Cramp and spasm  Abnormal posture  Other abnormalities of gait and mobility     Problem List There are no active problems to display for this patient.   Eulis Foster, PT 03/05/18 4:37 PM   [facility] [address], [address] [place], [place], [address] Phone: [phone]   Fax:  [phone]  Name: Charlene Ferguson MRN: [id number] Date of Birth: [birth year]

## 2018-03-05 NOTE — Patient Instructions (Signed)
Access Code: [email or web address]  URL: [email or web address]  Date: 03/05/2018  Prepared by: Eulis Fosterheryl Gray   Exercises  Seated Piriformis Stretch with Trunk Bend - 12 reps - 1 sets - 30 hold - 1x daily - 7x weekly  Seated Hamstring Stretch - 2 reps - 1 sets - 30 hold - 1x daily - 7x weekly  V Sit Hip Adductor Hamstring Stretch - 2 reps - 1 sets - 1 min hold - 1x daily - 7x weekly  Cat Cow - 10 reps - 1 sets - 1x daily - 7x weekly  Child's Pose with Sidebending - 1 reps - 1 sets - 30 hold - 1x daily - 7x weekly  Static Prone on Elbows - 2 reps - 1 sets - 30 hold - 1x daily - 7x weekly  Supine Diaphragmatic Breathing - 10 reps - 1 sets - 5 hold - 1x daily - 7x weekly  Beginner Front Arm Support - 10 reps - 1 sets - 1x daily - 7x weekly  Quadruped Alternating Arm Lift - 10 reps - 1 sets - 1x daily - 7x weekly  Supine Chin Tuck - 5 reps - 1 sets - 5 hold - 1x daily - 7x weekly  Supine Scapular Retraction - 5 reps - 1 sets - 5 hold - 1x daily - 7x weekly  Supine Transversus Abdominis Bracing - Hands on Ground - 10 reps - 1 sets - 5 hold - 1x daily - 7x weekly  Doorway Pec Stretch at 90 Degrees Abduction - 4 reps - 1 sets - 15 hold - 1x daily - 7x weekly  Upper Trapezius Stretch - 2 reps - 1 sets - 30 hold - 1x daily - 7x weekly  [facility] [address], [address] [place], [place] [address] Phone # [phone] Fax [phone]

## 2018-03-10 ENCOUNTER — Ambulatory Visit: Payer: Managed Care, Other (non HMO) | Admitting: Physical Therapy

## 2018-03-12 ENCOUNTER — Ambulatory Visit: Payer: Managed Care, Other (non HMO) | Admitting: Physical Therapy

## 2018-03-12 ENCOUNTER — Encounter: Payer: Self-pay | Admitting: Physical Therapy

## 2018-03-12 DIAGNOSIS — M6281 Muscle weakness (generalized): Secondary | ICD-10-CM | POA: Diagnosis not present

## 2018-03-12 DIAGNOSIS — R252 Cramp and spasm: Secondary | ICD-10-CM

## 2018-03-12 DIAGNOSIS — R278 Other lack of coordination: Secondary | ICD-10-CM

## 2018-03-12 NOTE — Therapy (Signed)
[facility] [address] [place], [place], [address] Phone: [phone]   Fax:  [phone]  Physical Therapy Treatment  Patient Details  Name: Charlene Ferguson MRN: [id number] Date of Birth: 11-16-1985 Referring Provider: Dr. Daleen Squibb Abu-Alnadi   Encounter Date: 03/12/2018  PT End of Session - 03/12/18 1701    Visit Number  7    Date for PT Re-Evaluation  05/06/18    Authorization Type  [employer]    PT Start Time  1615    PT Stop Time  1655    PT Time Calculation (min)  40 min    Activity Tolerance  Patient tolerated treatment well;Patient limited by pain    Behavior During Therapy  [facility] for tasks assessed/performed       Past Medical History:  Diagnosis Date  . Heart murmur   . Kidney stones   . Motion sickness    car back seat  . Ovarian cyst 2010   bilateral side. R cyst has been removed 2016  . SBO (small bowel obstruction) (HCC)    2010  . 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 ABDOMINAL SURGERY    . NASAL SEPTOPLASTY W/ TURBINOPLASTY Bilateral 11/21/2017   Procedure: NASAL SEPTOPLASTY WITH TURBINATE REDUCTION;  Surgeon: Vernie Murders, MD;  [facility];  Service: ENT;  Laterality: Bilateral;  . OVARIAN CYST SURGERY    . SMALL INTESTINE SURGERY     at birth to remove volvulus    There were no vitals filed for this visit.  Subjective Assessment - 03/12/18 1620    Subjective  I had an ultrasound today to see if I have a fibroid. Pain on the right side is a bowel loop and ovary is on other side.  Has to press the gas out to get to the ovary.     Pertinent History  Hx of volvulus with 4 in of small intestines removed at birth, R ovarian cyst removed, small bowel obstruction.     Patient Stated Goals  stop pain and decrease constipation    Currently in Pain?  Yes    Pain Score  7     Pain Location  Abdomen    Pain Orientation  Right;Mid    Pain Descriptors / Indicators  Sore;Spasm    Pain Type  Chronic pain    Pain Onset  In the past 7 days    Pain Frequency  Intermittent    Aggravating Factors   when has pain in the other areas of the body    Pain Relieving Factors  not sure    Multiple Pain Sites  No                       [facility] Adult PT Treatment/Exercise - 03/12/18 0001      Manual Therapy   Manual Therapy  Soft tissue mobilization;Myofascial release    Soft tissue mobilization  right iliopsoas, right hip adductor, right quad while bringing right hip extension , lifting up of scars, circular massage of abdomen    Myofascial Release  One had on abdomen and other on back goidn through 3 planes of fascia       Trigger Point Dry Needling - 03/12/18 1658    Consent Given?  Yes    Muscles Treated Upper Body  Iliopsoas  right    Muscles Treated Lower Body  -- twitch response, elongation of tissue             PT Short Term Goals - 03/03/18 1701      PT SHORT TERM GOAL #2   Title  Pt will demo decreased tenderness with palpation over areas of the abdomina scar in order to promote motility and  bowel movements    Time  4    Period  Weeks    Status  Achieved      PT SHORT TERM GOAL #3   Title  Pt will demo increased diaphragmatic excursion and pelvic floor lengthening in order to promote improved toileting mechanics     Time  4    Period  Weeks    Status  Achieved      PT SHORT TERM GOAL #4   Title  understand how to toilet correctly to reduce straing and fully empty her bowels    Time  4    Period  Weeks    Status  Achieved        PT Long Term Goals - 03/12/18 1625      PT LONG TERM GOAL #2   Title  pain with penetration improved >/= 75% due to improved tissue mobility    Baseline  50% BETTER    Time  12    Period  Weeks    Status  On-going      PT LONG TERM GOAL #3   Title  improved abdominal scar mobilty so abdominal  pain decreased >/= 75%     Time  12    Period  Weeks    Status  On-going      PT LONG TERM GOAL #4   Title  pain with daily tasks decreased >/= 75% due to improved core and hip strength    Baseline  50% better    Time  12    Period  Weeks    Status  On-going      PT LONG TERM GOAL #5   Title  pain with vaginal exam decreased >/= 75% due to ability to relax the pelvic floor tissues and bulge    Time  12    Period  Weeks    Status  On-going      PT LONG TERM GOAL #6   Title  bowel movements decreased >/= 5 per day due to improve tissue mobility and reduction in scar tightness    Time  12    Period  Weeks    Status  On-going            Plan - 03/12/18 1701    Clinical Impression Statement  Patient reports pain with intercourse is 50% better but she has to stop in the middle.  After manual work pain decreased to 5/10.  Patient did not have pinpoint pain in right lower quadrant after manual work. Patient scar has increased mobility.  Patient will benefit from skilled therapy to work on postural strength to improve posture and reduce pain.     Rehab Potential  Good    Clinical Impairments Affecting Rehab Potential  extensive intra-abdominal adhesive disease; history of recurrent ovarian cysts; IBS; constipation; mood disorder; myofascial abdominal/pelvic dysfunction    PT Frequency  2x / week    PT Duration  12 weeks    PT Treatment/Interventions  Biofeedback;Cryotherapy;Physicist, medicallectrical Stimulation;Moist Heat;Ultrasound;Balance training;Therapeutic exercise;Therapeutic activities;Neuromuscular re-education;Patient/family education;Manual techniques;Passive range of motion;Dry needling    PT Next  Visit Plan  adominal scar release and soft tissue work inner thigh and possible pelvic floor, postural strength    PT Home Exercise Plan  Access Code: [email or web address]    Consulted and Agree with Plan of Care  Patient       Patient will benefit from skilled therapeutic intervention in order to  improve the following deficits and impairments:  Pain, Increased fascial restricitons, Decreased coordination, Decreased mobility, Decreased scar mobility, Increased muscle spasms, Impaired tone, Decreased activity tolerance, Decreased endurance, Decreased range of motion, Decreased strength, Decreased balance  Visit Diagnosis: Muscle weakness (generalized)  Other lack of coordination  Cramp and spasm     Problem List There are no active problems to display for this patient.   Eulis Foster, PT 03/12/18 5:04 PM   [facility] [address], [address] [place], [place], [address] Phone: [phone]   Fax:  [phone]  Name: Charlene Ferguson MRN: [id number] Date of Birth: 1986-05-23

## 2018-03-17 ENCOUNTER — Encounter: Payer: 59 | Admitting: Physical Therapy

## 2018-03-19 ENCOUNTER — Encounter: Payer: 59 | Admitting: Physical Therapy

## 2018-03-25 ENCOUNTER — Encounter: Payer: 59 | Admitting: Physical Therapy

## 2018-04-02 ENCOUNTER — Ambulatory Visit: Payer: Managed Care, Other (non HMO) | Admitting: Physical Therapy

## 2018-04-07 ENCOUNTER — Telehealth: Payer: Self-pay | Admitting: Physical Therapy

## 2018-04-07 ENCOUNTER — Ambulatory Visit: Payer: Managed Care, Other (non HMO) | Attending: Obstetrics & Gynecology | Admitting: Physical Therapy

## 2018-04-07 DIAGNOSIS — R293 Abnormal posture: Secondary | ICD-10-CM | POA: Insufficient documentation

## 2018-04-07 DIAGNOSIS — M6281 Muscle weakness (generalized): Secondary | ICD-10-CM | POA: Insufficient documentation

## 2018-04-07 DIAGNOSIS — R252 Cramp and spasm: Secondary | ICD-10-CM | POA: Insufficient documentation

## 2018-04-07 DIAGNOSIS — R278 Other lack of coordination: Secondary | ICD-10-CM | POA: Insufficient documentation

## 2018-04-07 NOTE — Telephone Encounter (Signed)
Called patient about her appointment at 15:30 that she did not attend.  Left message.  Charlene FosterCheryl Leenah Ferguson, PT @7 /15/2019@ 3:52 PM

## 2018-04-23 ENCOUNTER — Ambulatory Visit: Payer: Managed Care, Other (non HMO) | Admitting: Physical Therapy

## 2018-04-23 ENCOUNTER — Encounter: Payer: Self-pay | Admitting: Physical Therapy

## 2018-04-23 DIAGNOSIS — R252 Cramp and spasm: Secondary | ICD-10-CM | POA: Diagnosis present

## 2018-04-23 DIAGNOSIS — R278 Other lack of coordination: Secondary | ICD-10-CM | POA: Diagnosis present

## 2018-04-23 DIAGNOSIS — M6281 Muscle weakness (generalized): Secondary | ICD-10-CM | POA: Diagnosis present

## 2018-04-23 DIAGNOSIS — R293 Abnormal posture: Secondary | ICD-10-CM | POA: Diagnosis present

## 2018-04-23 NOTE — Therapy (Signed)
[facility] [address], [address] [place], [place], [address] Phone: [phone]   Fax:  [phone]  Physical Therapy Treatment  Patient Details  Name: Charlene CapersJuana H Abate MRN: [id number] Date of Birth: [birth year] Referring Provider: Dr. Daleen SquibbNoor Dasouki Abu-Aldnadi   Encounter Date: 04/23/2018  PT End of Session - 04/23/18 1536    Visit Number  8    Date for PT Re-Evaluation  09/05/18    Authorization Type  [employer]    PT Start Time  1530    PT Stop Time  1610    PT Time Calculation (min)  40 min    Activity Tolerance  Patient tolerated treatment well    Behavior During Therapy  [facility] for tasks assessed/performed       Past Medical History:  Diagnosis Date  . Heart murmur   . Kidney stones   . Motion sickness    car back seat  . Ovarian cyst 2010   bilateral side. R cyst has been removed 2016  . SBO (small bowel obstruction) (HCC)    2010  . 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 ABDOMINAL SURGERY    . NASAL SEPTOPLASTY W/ TURBINOPLASTY Bilateral 11/21/2017   Procedure: NASAL SEPTOPLASTY WITH TURBINATE REDUCTION;  Surgeon: Vernie MurdersJuengel, Paul, MD;  [facility];  Service: ENT;  Laterality: Bilateral;  . OVARIAN CYST SURGERY    . SMALL INTESTINE SURGERY     at birth to remove volvulus    There were no vitals filed for this visit.  Subjective Assessment - 04/23/18 1536    Subjective  Negative ultrasound. Primary doctor diagnosed me with fibromyalgia. GI specialist wants me to have PT. I am on my cycle and having breakthrough period.     Pertinent History  Hx of volvulus with 4 in of small intestines removed at birth, R ovarian cyst removed, small bowel obstruction.     Patient Stated Goals  stop pain and decrease constipation    Currently in Pain?  Yes    Pain Location  Abdomen    Pain Orientation  Mid;Right     Pain Descriptors / Indicators  Spasm;Sore    Pain Type  Chronic pain    Pain Onset  In the past 7 days    Pain Frequency  Constant    Aggravating Factors   when has pain in the other areas of the body    Pain Relieving Factors  not sure    Multiple Pain Sites  No         [facility] PT Assessment - 04/23/18 0001      Assessment   Medical Diagnosis  [id number] Myofascial pain dysfunction syndrome    Referring Provider  Dr. Daleen SquibbNoor Dasouki Abu-Aldnadi    Prior Therapy  Yes for one session      Precautions   Precautions  None      Restrictions   Weight Bearing Restrictions  No      Home Environment   Living Environment  Private residence      Prior Function   Level of Independence  Independent    Vocation  Full time employment student    Vocation Requirements  standing, lifting, walking, bending, jumping    Leisure  32 year old daughter      Cognition   Overall Cognitive Status  Within Functional Limits for  tasks assessed      Observation/Other Assessments   Observations  keeps her legs constantly moving due to restless leg syndrome      AROM   Lumbar Flexion  full    Lumbar Extension  decreased by 25% with pain    Lumbar - Right Side Bend  full    Lumbar - Left Side Bend  full      Strength   Right Hip Extension  5/5    Right Hip External Rotation   5/5    Right Hip Internal Rotation  5/5    Right Hip ABduction  4/5    Right Hip ADduction  5/5    Left Hip Extension  4/5    Left Hip External Rotation  5/5    Left Hip Internal Rotation  5/5    Left Hip ABduction  4/5      Palpation   SI assessment   ASIS are equal    Palpation comment  right quadrant pain      Special Tests    Special Tests  Sacrolliac Tests    Sacroiliac Tests   Pelvic Distraction      Pelvic Dictraction   Findings  Negative    Side   Right                Pelvic Floor Special Questions - 04/23/18 0001    Currently Sexually Active  Yes    Is this Painful  Yes    Marinoff Scale  pain  prevents any attempts at intercourse    Urinary Leakage  Yes    Activities that cause leaking  With strong urge    Fecal incontinence  No 3-5 times per day        [facility] Adult PT Treatment/Exercise - 04/23/18 0001      Manual Therapy   Manual Therapy  Soft tissue mobilization;Myofascial release    Soft tissue mobilization  abdominals, diaphgram, along the abdominal scars    Myofascial Release  along the scar, around the umbilicus, right lower quadrant, right lower transverse abdominus               PT Short Term Goals - 03/03/18 1701      PT SHORT TERM GOAL #2   Title  Pt will demo decreased tenderness with palpation over areas of the abdomina scar in order to promote motility and  bowel movements    Time  4    Period  Weeks    Status  Achieved      PT SHORT TERM GOAL #3   Title  Pt will demo increased diaphragmatic excursion and pelvic floor lengthening in order to promote improved toileting mechanics     Time  4    Period  Weeks    Status  Achieved      PT SHORT TERM GOAL #4   Title  understand how to toilet correctly to reduce straing and fully empty her bowels    Time  4    Period  Weeks    Status  Achieved        PT Long Term Goals - 04/23/18 1542      PT LONG TERM GOAL #1   Title  independent wtih HEP and understand how to progress herself    Baseline  still learning    Time  12    Period  Weeks    Status  On-going      PT LONG TERM GOAL #2  Title  pain with penetration improved >/= 75% due to improved tissue mobility    Time  12    Period  Weeks    Status  On-going      PT LONG TERM GOAL #3   Title  improved abdominal scar mobilty so abdominal pain decreased >/= 75%     Time  12    Period  Weeks    Status  On-going      PT LONG TERM GOAL #4   Title  pain with daily tasks decreased >/= 75% due to improved core and hip strength    Baseline  50% better    Time  12    Period  Weeks    Status  On-going      PT LONG TERM GOAL #5   Title   pain with vaginal exam decreased >/= 75% due to ability to relax the pelvic floor tissues and bulge    Time  12    Period  Weeks    Status  On-going      PT LONG TERM GOAL #6   Title  bowel movements decreased >/= 5 per day due to improve tissue mobility and reduction in scar tightness    Baseline  bowel movements 3-4 per day    Time  12    Period  Weeks    Status  Achieved            Plan - 04/23/18 1656    Clinical Impression Statement  Patient is now able to have 3-5 bowel movements compared to 7-15.  Patient has increased moility of scar by 50%.  Patient had no right lower quadrant pain after manual work.  Patient right half of scar is not scared inward after manual work.  Patient is able to perform diaphgramatic breathing to improve pelvic floor mobiity.  Patient does not have increased pain after abdominal work  compared to the beginning of therapy.  Patient will benefit from skilled therapy to work on postural strength to improve posture and reduce pain while working on scar mobility.     Rehab Potential  Good    Clinical Impairments Affecting Rehab Potential  extensive intra-abdominal adhesive disease; history of recurrent ovarian cysts; IBS; constipation; mood disorder; myofascial abdominal/pelvic dysfunction    PT Frequency  Biweekly    PT Duration  Other (comment) 4 months    PT Treatment/Interventions  Biofeedback;Cryotherapy;Physicist, medical;Therapeutic exercise;Therapeutic activities;Neuromuscular re-education;Patient/family education;Manual techniques;Passive range of motion;Dry needling    PT Next Visit Plan  adominal scar release and soft tissue work inner thigh and possible pelvic floor, postural strength    PT Home Exercise Plan  Access Code: [email or web address]    Recommended Other Services  renewal note sent to MD on 04/23/2018    Consulted and Agree with Plan of Care  Patient       Patient will benefit from skilled therapeutic  intervention in order to improve the following deficits and impairments:  Pain, Increased fascial restricitons, Decreased coordination, Decreased mobility, Decreased scar mobility, Increased muscle spasms, Impaired tone, Decreased activity tolerance, Decreased endurance, Decreased range of motion, Decreased strength, Decreased balance  Visit Diagnosis: Muscle weakness (generalized) - Plan: PT plan of care cert/re-cert  Other lack of coordination - Plan: PT plan of care cert/re-cert  Cramp and spasm - Plan: PT plan of care cert/re-cert  Abnormal posture - Plan: PT plan of care cert/re-cert     Problem List There are no active problems to display for this  patient.   Eulis Foster, PT 04/23/18 5:02 PM   [facility] [address] [place], [place], [address] Phone: [phone]   Fax:  [phone]  Name: TIEGAN JAMBOR MRN: [id number] Date of Birth: 1986/01/12

## 2018-05-19 ENCOUNTER — Encounter: Payer: 59 | Admitting: Physical Therapy

## 2018-05-19 ENCOUNTER — Telehealth: Payer: Self-pay | Admitting: Physical Therapy

## 2018-05-19 NOTE — Telephone Encounter (Signed)
Called patient about her appointment at 4:15PM  that she missed. Left a message.  Charlene FosterCheryl Akshita Italiano, PT @8 /26/2019@ 4:38 PM

## 2018-07-08 ENCOUNTER — Ambulatory Visit: Payer: 59 | Admitting: Neurology

## 2018-07-08 ENCOUNTER — Encounter: Payer: Self-pay | Admitting: Neurology

## 2018-07-08 ENCOUNTER — Telehealth: Payer: Self-pay | Admitting: Neurology

## 2018-07-08 VITALS — BP 117/85 | HR 89 | Ht 59.0 in | Wt 113.0 lb

## 2018-07-08 DIAGNOSIS — R202 Paresthesia of skin: Secondary | ICD-10-CM

## 2018-07-08 DIAGNOSIS — IMO0002 Reserved for concepts with insufficient information to code with codable children: Secondary | ICD-10-CM

## 2018-07-08 DIAGNOSIS — G43709 Chronic migraine without aura, not intractable, without status migrainosus: Secondary | ICD-10-CM | POA: Diagnosis not present

## 2018-07-08 MED ORDER — NORTRIPTYLINE HCL 10 MG PO CAPS: 20.0000 mg | ORAL_CAPSULE | Freq: Every day | ORAL | 11 refills | Status: DC

## 2018-07-08 NOTE — Telephone Encounter (Signed)
[employer] order sent to GI. They obtain they auth and will reach out tot he pt to schedule.  °

## 2018-07-08 NOTE — Progress Notes (Signed)
PATIENT: Charlene Ferguson DOB: 1986/05/17  Chief Complaint  Patient presents with  . New Patient (Initial Visit)    PCP/Referring: Dr. Lacie Scotts. Vision: 20/30 without correction.  . Migraine    Pt has a daily migraine. She tried fioricet TID and that did not help.     HISTORICAL  Charlene Ferguson is a 32 years old female, seen in request by her primary care physician Dr. Lacie Scotts, [name], for evaluation of chronic migraine, initial evaluation was on July 08, 2018.  I have reviewed and summarized the referring note from the referring physician.  She had a past medical history of kidney stone, cardiac murmur.  She reported a history of chronic migraine in her 17s, gradually getting worse, now she is having migraine headache every day, bilateral retro-orbital area severe pounding headache light noise sensitivity, blurry vision, she also complains of bilateral fingertips, feet paresthesia, whole body achy pain, was given the diagnosis of fibromyalgia, she works as a Emergency planning/management officer, it is hard for her to work through her job, complains of memory loss, even difficulty writing her own name, I personally reviewed CT head without contrast in July 2017 that was normal Right laboratory evaluations in July 2019 showed normal CBC hemoglobin of 12.8, normal CMP, creatinine of 0.66, INR of 1.02,  REVIEW OF SYSTEMS: Full 14 system review of systems performed and notable only for fever, weight gain, fatigue, murmur, ringing in ears, spinning sensation, blurred vision, loss of vision, double vision, shortness of breath, diarrhea, constipation, swelling, aching muscles muscle cramps, skin sensitivity, memory loss, confusion, headaches, numbness, weakness, slurred speech, dizziness, tremor, insomnia, significant weakness, restless leg, depression, anxiety, not enough sleep, decreased energy change in appetite disinterested in activities, racing thoughts All other review of systems were  negative.  ALLERGIES: Allergies  Allergen Reactions  . Morphine And Related Nausea And Vomiting and Rash    HOME MEDICATIONS: Current Outpatient Medications  Medication Sig Dispense Refill  . albuterol (PROVENTIL HFA;VENTOLIN HFA) 108 (90 Base) MCG/ACT inhaler Inhale 2 puffs into the lungs every 6 (six) hours as needed for wheezing or shortness of breath.    . Cholecalciferol (D3 MAXIMUM STRENGTH) 5000 units capsule Take 5,000 Units by mouth once a week.    . desogestrel-ethinyl estradiol (APRI,EMOQUETTE,SOLIA) 0.15-30 MG-MCG tablet Take one active pill daily, skip placebo pills, for menses suppression    . dicyclomine (BENTYL) 20 MG tablet Take 1 tablet (20 mg total) by mouth 2 (two) times daily. 20 tablet 0  . DULoxetine (CYMBALTA) 60 MG capsule Take 90 mg by mouth daily.     Marland Kitchen gabapentin (NEURONTIN) 300 MG capsule Take 300 mg by mouth 3 (three) times daily.    . hydrOXYzine (ATARAX/VISTARIL) 10 MG tablet Take 10 mg by mouth 3 (three) times daily as needed.    Marland Kitchen levofloxacin (LEVAQUIN) 500 MG tablet Take 500 mg by mouth daily.    Marland Kitchen linaclotide (LINZESS) 72 MCG capsule Take 72 mcg by mouth daily before breakfast.    . lubiprostone (AMITIZA) 24 MCG capsule Take 24 mcg by mouth 2 (two) times daily with a meal.    . Melatonin 10 MG CAPS Take 10 mg by mouth at bedtime.    . meloxicam (MOBIC) 7.5 MG tablet Take 7.5 mg by mouth 2 (two) times daily.    . pantoprazole (PROTONIX) 40 MG tablet Take 40 mg by mouth daily.    . polyethylene glycol (MIRALAX / GLYCOLAX) packet Take 17 g by mouth daily as needed for  mild constipation.    . Probiotic Product (PROBIOTIC DAILY PO) Take by mouth.    . promethazine (PHENERGAN) 25 MG suppository Place 1 suppository (25 mg total) rectally every 6 (six) hours as needed for nausea or vomiting. 12 each 0  . promethazine (PHENERGAN) 25 MG tablet Take 1 tablet (25 mg total) by mouth every 6 (six) hours as needed for nausea or vomiting. 30 tablet 0  . triamcinolone  (NASACORT ALLERGY 24HR) 55 MCG/ACT AERO nasal inhaler Place 2 sprays into the nose daily.     No current facility-administered medications for this visit.     PAST MEDICAL HISTORY: Past Medical History:  Diagnosis Date  . Heart murmur   . Kidney stones   . Motion sickness    car back seat  . Ovarian cyst 2010   bilateral side. R cyst has been removed 2016  . SBO (small bowel obstruction) (HCC)    2010  . 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 ABDOMINAL SURGERY    . NASAL SEPTOPLASTY W/ TURBINOPLASTY Bilateral 11/21/2017   Procedure: NASAL SEPTOPLASTY WITH TURBINATE REDUCTION;  Surgeon: Vernie Murders, MD;  [facility];  Service: ENT;  Laterality: Bilateral;  . OVARIAN CYST SURGERY    . SMALL INTESTINE SURGERY     at birth to remove volvulus    FAMILY HISTORY: Family History  Problem Relation Age of Onset  . Prostate cancer Neg Hx   . Kidney disease Neg Hx   . Kidney cancer Neg Hx     SOCIAL HISTORY: Social History   Socioeconomic History  . Marital status: Single    Spouse name: Not on file  . Number of children: 1  . Years of education: Not on file  . Highest education level: Not on file  Occupational History  . Not on file  Social Needs  . Financial resource strain: Not on file  . Food insecurity:    Worry: Not on file    Inability: Not on file  . Transportation needs:    Medical: Not on file    Non-medical: Not on file  Tobacco Use  . Smoking status: Never Smoker  . Smokeless tobacco: Never Used  Substance and Sexual Activity  . Alcohol use: No  . Drug use: No  . Sexual activity: Yes    Birth control/protection: Pill  Lifestyle  . Physical activity:    Days per week: Not on file    Minutes per session: Not on file  . Stress: Not on file  Relationships  . Social connections:    Talks on phone: Not on  file    Gets together: Not on file    Attends religious service: Not on file    Active member of club or organization: Not on file    Attends meetings of clubs or organizations: Not on file    Relationship status: Not on file  . Intimate partner violence:    Fear of current or ex partner: Not on file    Emotionally abused: Not on file    Physically abused: Not on file    Forced sexual activity: Not on file  Other Topics Concern  . Not on file  Social History Narrative  . Not on file     PHYSICAL EXAM   There were no vitals filed for this visit.  Not recorded      There is no height or weight on file to calculate BMI.  PHYSICAL EXAMNIATION:  Gen: NAD, conversant, well nourised, obese, well groomed                     Cardiovascular: Regular rate rhythm, no peripheral edema, warm, nontender. Eyes: Conjunctivae clear without exudates or hemorrhage Neck: Supple, no carotid bruits. Pulmonary: Clear to auscultation bilaterally   NEUROLOGICAL EXAM:  MENTAL STATUS: Speech:    Speech is normal; fluent and spontaneous with normal comprehension.  Cognition:     Orientation to time, place and person     Normal recent and remote memory     Normal Attention span and concentration     Normal Language, naming, repeating,spontaneous speech     Fund of knowledge   CRANIAL NERVES: CN II: Visual fields are full to confrontation. Fundoscopic exam is normal with sharp discs and no vascular changes. Pupils are round equal and briskly reactive to light. CN III, IV, VI: extraocular movement are normal. No ptosis. CN V: Facial sensation is intact to pinprick in all 3 divisions bilaterally. Corneal responses are intact.  CN VII: Face is symmetric with normal eye closure and smile. CN VIII: Hearing is normal to rubbing fingers CN IX, X: Palate elevates symmetrically. Phonation is normal. CN XI: Head turning and shoulder shrug are intact CN XII: Tongue is midline with normal movements and  no atrophy.  MOTOR: There is no pronator drift of out-stretched arms. Muscle bulk and tone are normal. Muscle strength is normal.  REFLEXES: Reflexes are 2+ and symmetric at the biceps, triceps, knees, and ankles. Plantar responses are flexor.  SENSORY: Intact to light touch, pinprick, positional sensation and vibratory sensation are intact in fingers and toes.  COORDINATION: Rapid alternating movements and fine finger movements are intact. There is no dysmetria on finger-to-nose and heel-knee-shin.    GAIT/STANCE: Posture is normal. Gait is steady with normal steps, base, arm swing, and turning. Heel and toe walking are normal. Tandem gait is normal.  Romberg is absent.   DIAGNOSTIC DATA (LABS, IMAGING, TESTING) - I reviewed patient records, labs, notes, testing and imaging myself where available.   ASSESSMENT AND PLAN  Charlene Ferguson is a 32 y.o. female    Chronic migraine headaches, Also constellation of complaints, including bilateral upper lower extremity paresthesia, diffuse body achy pain  Laboratory evaluations for inflammatory markers, rule out thyroid dysfunction  MRI of the brain without contrast  Nortriptyline 10, titrating to 20 mg every night as migraine prevention  Imitrex as needed  Levert Feinstein, M.D. Ph.D.  [facility] [address] [place], [place] [address] Ph: [phone] Fax: [phone]  CC: Evelene Croon, MD

## 2018-07-08 NOTE — Telephone Encounter (Signed)
Pt is aware of this.. She also has GI phone number of [phone] if she has not heard in the next 2-3 business days.

## 2018-07-09 LAB — C-REACTIVE PROTEIN: CRP: 28 mg/L — AB (ref 0–10)

## 2018-07-09 LAB — RPR: RPR Ser Ql: NONREACTIVE

## 2018-07-09 LAB — ANA W/REFLEX IF POSITIVE: Anti Nuclear Antibody(ANA): NEGATIVE

## 2018-07-09 LAB — SEDIMENTATION RATE: Sed Rate: 10 mm/hr (ref 0–32)

## 2018-07-09 LAB — VITAMIN B12: Vitamin B-12: 298 pg/mL (ref 232–1245)

## 2018-07-09 LAB — CK: Total CK: 69 U/L (ref 24–173)

## 2018-07-09 LAB — FOLATE

## 2018-07-09 LAB — VITAMIN D 25 HYDROXY (VIT D DEFICIENCY, FRACTURES): Vit D, 25-Hydroxy: 31.6 ng/mL (ref 30.0–100.0)

## 2018-07-10 ENCOUNTER — Telehealth: Payer: Self-pay | Admitting: Neurology

## 2018-07-10 NOTE — Telephone Encounter (Signed)
Left patient a detailed message, with results, on voicemail (ok per DPR).  Provided our number to call back with any questions.  

## 2018-07-10 NOTE — Telephone Encounter (Signed)
The only abnormality at extensive laboratory evaluation is mild elevated C-reactive protein 28, in the setting of normal ESR, it has unknown clinical significance

## 2018-07-16 ENCOUNTER — Encounter: Payer: Self-pay | Admitting: Physical Therapy

## 2018-07-16 ENCOUNTER — Ambulatory Visit: Payer: Managed Care, Other (non HMO) | Attending: Obstetrics & Gynecology | Admitting: Physical Therapy

## 2018-07-16 DIAGNOSIS — R278 Other lack of coordination: Secondary | ICD-10-CM | POA: Diagnosis present

## 2018-07-16 DIAGNOSIS — M6281 Muscle weakness (generalized): Secondary | ICD-10-CM | POA: Insufficient documentation

## 2018-07-16 DIAGNOSIS — R252 Cramp and spasm: Secondary | ICD-10-CM | POA: Diagnosis present

## 2018-07-16 NOTE — Addendum Note (Signed)
Addended by: Eulis Foster F on: 07/16/2018 10:03 PM   Modules accepted: Orders

## 2018-07-16 NOTE — Therapy (Signed)
[facility] [address], [address] [place], [place], [address] Phone: [phone]   Fax:  [phone]  Physical Therapy Treatment  Patient Details  Name: Charlene Ferguson MRN: [id number] Date of Birth: 1985-10-20 Referring Provider (PT): Dr. Daleen Squibb Abu-Aldnadi   Encounter Date: 07/16/2018  PT End of Session - 07/16/18 2146    Visit Number  9    Date for PT Re-Evaluation  09/10/18    Authorization Type  [employer]    PT Start Time  1545   came late   PT Stop Time  1615    PT Time Calculation (min)  30 min    Activity Tolerance  Patient tolerated treatment well;No increased pain    Behavior During Therapy  WFL for tasks assessed/performed       Past Medical History:  Diagnosis Date  . Anxiety   . Depression   . Fibromyalgia   . Heart murmur   . IBS (irritable bowel syndrome)   . Kidney stones   . Migraine   . Motion sickness    car back seat  . Neuropathy   . Ovarian cyst 2010   bilateral side. R cyst has been removed 2016  . SBO (small bowel obstruction) (HCC)    2010  . 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 ABDOMINAL SURGERY    . NASAL SEPTOPLASTY W/ TURBINOPLASTY Bilateral 11/21/2017   Procedure: NASAL SEPTOPLASTY WITH TURBINATE REDUCTION;  Surgeon: Vernie Murders, MD;  Location: [facility];  Service: ENT;  Laterality: Bilateral;  . OVARIAN CYST SURGERY    . SMALL INTESTINE SURGERY     at birth to remove volvulus    There were no vitals filed for this visit.  Subjective Assessment - 07/16/18 1545    Subjective  I have not had a memory.  My body has been hurting more. My depression is worse. I saw a neurologist last week.  The Fibromyalgia is worse.  The neurologist put me on a new medicaiton.  I have been taking the medication for weeks. I had an ovarian cyst that ruptured.  Last night I fell  getting out of the shower. I have been falling alot due to the legs giving out.     Pertinent History  Hx of volvulus with 4 in of small intestines removed at birth, R ovarian cyst removed, small bowel obstruction.     Patient Stated Goals  stop pain and decrease constipation    Currently in Pain?  Yes    Pain Score  7     Pain Location  Abdomen    Pain Orientation  Anterior    Pain Descriptors / Indicators  Spasm;Sore    Pain Type  Chronic pain    Pain Onset  In the past 7 days    Pain Frequency  Constant    Aggravating Factors   stress, coughing, sneezing, lifting    Pain Relieving Factors  rest    Multiple Pain Sites  No         [facility] PT Assessment - 07/16/18 0001      Assessment   Medical Diagnosis  [id number] Myofascial pain dysfunction syndrome    Referring Provider (PT)  Dr. Daleen Squibb Abu-Aldnadi    Prior Therapy  Yes for one session      Precautions   Precautions  None  Restrictions   Weight Bearing Restrictions  No      Balance Screen   Has the patient fallen in the past 6 months  Yes    How many times?  3   because her legs gave way   Has the patient had a decrease in activity level because of a fear of falling?   No    Is the patient reluctant to leave their home because of a fear of falling?   No      Home Public house manager residence      Prior Function   Level of Independence  Independent    Vocation  Full time employment   student   Vocation Requirements  standing, lifting, walking, bending, jumping    Leisure  32 year old daughter      Cognition   Overall Cognitive Status  Within Functional Limits for tasks assessed      Observation/Other Assessments   Observations  keeps her legs constantly moving due to restless leg syndrome      Posture/Postural Control   Posture/Postural Control  Postural limitations    Postural Limitations  Rounded Shoulders;Forward head;Flexed trunk   sitting     ROM / Strength   AROM / PROM /  Strength  AROM;PROM;Strength      AROM   Lumbar Flexion  full    Lumbar Extension  decreased by 50% with abdominal pain    Lumbar - Right Side Bend  full    Lumbar - Left Side Bend  full      Strength   Right Hip Extension  4/5    Right Hip External Rotation   5/5    Right Hip Internal Rotation  5/5    Right Hip ABduction  4/5    Right Hip ADduction  4/5    Left Hip Extension  4/5    Left Hip External Rotation  5/5    Left Hip Internal Rotation  5/5    Left Hip ABduction  4/5      Palpation   SI assessment   right ilium is rotated anteriorly    Palpation comment  tenderness located throughout abdomen      Special Tests    Special Tests  Sacrolliac Tests    Sacroiliac Tests   Pelvic Distraction      Pelvic Dictraction   Findings  Positive    Side   Right    Comment  pain                Pelvic Floor Special Questions - 07/16/18 0001    Prior Pregnancies  Yes    Number of Pregnancies  1    Number of Vaginal Deliveries  1    Currently Sexually Active  Yes    Is this Painful  No    Marinoff Scale  no problems    Urinary Leakage  Yes    Activities that cause leaking  With strong urge    Skin Integrity  Intact    Pelvic Floor Internal Exam  deferred at this time due to hurting all over and depressed    Exam Type  Deferred        [facility] Adult PT Treatment/Exercise - 07/16/18 0001      Manual Therapy   Manual Therapy  Soft tissue mobilization;Myofascial release    Soft tissue mobilization  abdominals, diaphgram, along the abdominal scars    Myofascial Release  along the scar, around  the umbilicus, right lower quadrant, right lower transverse abdominus               PT Short Term Goals - 03/03/18 1701      PT SHORT TERM GOAL #2   Title  Pt will demo decreased tenderness with palpation over areas of the abdomina scar in order to promote motility and  bowel movements    Time  4    Period  Weeks    Status  Achieved      PT SHORT TERM GOAL #3    Title  Pt will demo increased diaphragmatic excursion and pelvic floor lengthening in order to promote improved toileting mechanics     Time  4    Period  Weeks    Status  Achieved      PT SHORT TERM GOAL #4   Title  understand how to toilet correctly to reduce straing and fully empty her bowels    Time  4    Period  Weeks    Status  Achieved        PT Long Term Goals - 07/16/18 1614      PT LONG TERM GOAL #1   Title  independent wtih HEP and understand how to progress herself    Baseline  still learning    Time  8    Period  Weeks    Status  On-going    Target Date  09/10/18      PT LONG TERM GOAL #2   Title  pain with penetration improved >/= 75% due to improved tissue mobility    Time  12    Period  Weeks    Status  Achieved      PT LONG TERM GOAL #3   Title  improved abdominal scar mobilty so abdominal pain decreased >/= 75%     Time  8    Period  Weeks    Status  On-going    Target Date  09/10/18      PT LONG TERM GOAL #4   Title  pain with daily tasks decreased >/= 75% due to improved core and hip strength    Time  8    Period  Weeks    Status  On-going    Target Date  09/10/18      PT LONG TERM GOAL #5   Title  pain with vaginal exam decreased >/= 75% due to ability to relax the pelvic floor tissues and bulge    Time  12    Period  Weeks    Status  Achieved      PT LONG TERM GOAL #6   Title  bowel movements decreased >/= 5 per day due to improve tissue mobility and reduction in scar tightness    Baseline  bowel movements 3-4 per day    Time  12    Period  Weeks    Status  Achieved      PT LONG TERM GOAL #7   Title  urinary leakage improved >/= 75% with activity    Time  8    Period  Weeks    Status  New    Target Date  09/10/18      PT LONG TERM GOAL #8   Title  walking pain decreased >/= 75% due to increased strength     Time  8    Period  Weeks    Status  New    Target Date  09/10/18  Plan - 07/16/18 2147    Clinical  Impression Statement  Patient is able to have intercourse without pain. Patient has not been to therapy for 2 months due to going through separation with her husband and being depressed. Patient is having difficulty with her memory, trying to keep up with her school work, and continuing with her daily activities. Lumbar extension is decreased by 50% with abdominal pain. Tenderness located throughout the abdomen.  Right ilium is rotated anteriorly. Positive distraction test on the right. Patient reports her abdominal pain is 7/10 and she also has pain all over from her fibromyalgia. Patient reports urinary leakage with the urge to urinate. Patient will benefit from skilled therapy to reduce pain while working on strength so she is able to function with greater ease.     Rehab Potential  Good    Clinical Impairments Affecting Rehab Potential  extensive intra-abdominal adhesive disease; history of recurrent ovarian cysts; IBS; constipation; mood disorder; myofascial abdominal/pelvic dysfunction    PT Frequency  1x / week    PT Duration  8 weeks    PT Treatment/Interventions  Biofeedback;Cryotherapy;Physicist, medical;Therapeutic exercise;Therapeutic activities;Neuromuscular re-education;Patient/family education;Manual techniques;Passive range of motion;Dry needling    PT Next Visit Plan  adominal scar release and soft tissue work inner thigh and possible pelvic floor, postural strength; further assess the pelvic floor    PT Home Exercise Plan  Access Code: [email or web address]    Recommended Other Services  renewal note sent to MD    Consulted and Agree with Plan of Care  Patient       Patient will benefit from skilled therapeutic intervention in order to improve the following deficits and impairments:  Pain, Increased fascial restricitons, Decreased coordination, Decreased mobility, Decreased scar mobility, Increased muscle spasms, Impaired tone, Decreased activity  tolerance, Decreased endurance, Decreased range of motion, Decreased strength, Decreased balance  Visit Diagnosis: Muscle weakness (generalized)  Other lack of coordination  Cramp and spasm     Problem List Patient Active Problem List   Diagnosis Date Noted  . Paresthesia 07/08/2018  . Chronic migraine 07/08/2018    Eulis Foster, PT 07/16/18 10:00 PM   [facility] [address], [address] [place], [place], [address] Phone: [phone]   Fax:  [phone]  Name: JOLISSA KAPRAL MRN: [id number] Date of Birth: 1985/10/31

## 2018-07-22 ENCOUNTER — Encounter (HOSPITAL_COMMUNITY): Payer: Self-pay | Admitting: *Deleted

## 2018-07-22 ENCOUNTER — Emergency Department (HOSPITAL_COMMUNITY)
Admission: EM | Admit: 2018-07-22 | Discharge: 2018-07-22 | Disposition: A | Discharge status: Home / Self Care | Payer: Managed Care, Other (non HMO) | Attending: Emergency Medicine | Admitting: Emergency Medicine

## 2018-07-22 DIAGNOSIS — G43901 Migraine, unspecified, not intractable, with status migrainosus: Secondary | ICD-10-CM

## 2018-07-22 DIAGNOSIS — M797 Fibromyalgia: Secondary | ICD-10-CM | POA: Insufficient documentation

## 2018-07-22 DIAGNOSIS — W2201XA Walked into wall, initial encounter: Secondary | ICD-10-CM | POA: Insufficient documentation

## 2018-07-22 DIAGNOSIS — Z79899 Other long term (current) drug therapy: Secondary | ICD-10-CM | POA: Diagnosis not present

## 2018-07-22 DIAGNOSIS — R51 Headache: Secondary | ICD-10-CM | POA: Diagnosis present

## 2018-07-22 LAB — I-STAT CHEM 8, ED
BUN: 4 mg/dL — AB (ref 6–20)
CALCIUM ION: 1.18 mmol/L (ref 1.15–1.40)
CHLORIDE: 101 mmol/L (ref 98–111)
Creatinine, Ser: 0.6 mg/dL (ref 0.44–1.00)
GLUCOSE: 82 mg/dL (ref 70–99)
HCT: 42 % (ref 36.0–46.0)
Hemoglobin: 14.3 g/dL (ref 12.0–15.0)
Potassium: 3.4 mmol/L — ABNORMAL LOW (ref 3.5–5.1)
SODIUM: 140 mmol/L (ref 135–145)
TCO2: 31 mmol/L (ref 22–32)

## 2018-07-22 LAB — I-STAT BETA HCG BLOOD, ED (MC, WL, AP ONLY): I-stat hCG, quantitative: <5 m[IU]/mL (ref <5)

## 2018-07-22 LAB — CBC
HCT: 43 % (ref 36.0–46.0)
HEMOGLOBIN: 14 g/dL (ref 12.0–15.0)
MCH: 28.7 pg (ref 26.0–34.0)
MCHC: 32.6 g/dL (ref 30.0–36.0)
MCV: 88.3 fL (ref 80.0–100.0)
Platelets: 314 10*3/uL (ref 150–400)
RBC: 4.87 MIL/uL (ref 3.87–5.11)
RDW: 13.2 % (ref 11.5–15.5)
WBC: 6.6 10*3/uL (ref 4.0–10.5)
nRBC: 0 % (ref 0.0–0.2)

## 2018-07-22 MED ORDER — KETOROLAC TROMETHAMINE 30 MG/ML IJ SOLN
30.0000 mg | Freq: Once | INTRAMUSCULAR | Status: AC
  Administered 2018-07-22: 30 mg via INTRAVENOUS
  Filled 2018-07-22: qty 1

## 2018-07-22 MED ORDER — PROCHLORPERAZINE EDISYLATE 10 MG/2ML IJ SOLN
10.0000 mg | Freq: Once | INTRAMUSCULAR | Status: AC
  Administered 2018-07-22: 10 mg via INTRAVENOUS
  Filled 2018-07-22: qty 2

## 2018-07-22 MED ORDER — DIPHENHYDRAMINE HCL 50 MG/ML IJ SOLN
12.5000 mg | Freq: Once | INTRAMUSCULAR | Status: AC
  Administered 2018-07-22: 12.5 mg via INTRAVENOUS
  Filled 2018-07-22: qty 1

## 2018-07-22 MED ORDER — SODIUM CHLORIDE 0.9 % IV BOLUS
500.0000 mL | Freq: Once | INTRAVENOUS | Status: AC
  Administered 2018-07-22: 500 mL via INTRAVENOUS

## 2018-07-22 NOTE — ED Notes (Signed)
Patient verbalizes understanding of discharge instructions. Opportunity for questioning and answers were provided. Armband removed by staff, pt discharged from ED ambulatory with female companion.  

## 2018-07-22 NOTE — ED Notes (Signed)
ED Provider at bedside. 

## 2018-07-22 NOTE — ED Notes (Signed)
Pt refusing wheelchair. 

## 2018-07-22 NOTE — Discharge Instructions (Addendum)
Follow-up with your neurologist for further treatment of the headaches.

## 2018-07-22 NOTE — ED Triage Notes (Addendum)
To ED for eval of HA. States she has neuropathy and fibomyalgia and this am she's hurting so bad that she walked into her closet open door - hitting head, then hit head on bars in closet. No noted head trauma. Tearful. Ambulatory into triage. States she was dropped off at hospital. States she has a hx of migraines and this feels similar. Photophobia. Took migraine meds prior to arrival. At end of triage- pt states her head was hurting last night but she just doesn't know why her meds aren't working. States she falls frequently due to legs giving out.

## 2018-07-22 NOTE — ED Provider Notes (Signed)
[facility] EMERGENCY DEPARTMENT Provider Note   CSN: [id number] Arrival date & time: 07/22/18  0827     History   Chief Complaint Chief Complaint  Patient presents with  . Fall  . Headache    HPI Charlene Ferguson is a 32 y.o. female. Level 5 caveat due to acuity condition since patient is apparently unable to answer questions. HPI Patient presents with headache.  Patient states it is been constant.  Cannot tell me when it started.  Reviewing records around 2 weeks ago she saw neurology for chronic headaches.  Patient states she does not know whether she started the medicines that they were going to start for prophylaxis.  Does not know if she is taken anything for this headache.  States she did stumble today in her head which made her head hurt more but headache was there before the fall.  History of fibromyalgia.  History of chronic headaches.  No relief with any medicines that she took but is not sure if she took medicines or not. Past Medical History:  Diagnosis Date  . Anxiety   . Depression   . Fibromyalgia   . Heart murmur   . IBS (irritable bowel syndrome)   . Kidney stones   . Migraine   . Motion sickness    car back seat  . Neuropathy   . Ovarian cyst 2010   bilateral side. R cyst has been removed 2016  . SBO (small bowel obstruction) (HCC)    2010  . 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 Paresthesia 07/08/2018  . Chronic migraine 07/08/2018    Past Surgical History:  Procedure Laterality Date  . ABDOMINAL SURGERY    . NASAL SEPTOPLASTY W/ TURBINOPLASTY Bilateral 11/21/2017   Procedure: NASAL SEPTOPLASTY WITH TURBINATE REDUCTION;  Surgeon: Vernie Murders, MD;  Location: [facility];  Service: ENT;  Laterality: Bilateral;  . OVARIAN CYST SURGERY    . SMALL INTESTINE SURGERY     at birth to remove volvulus     OB History     None      Home Medications    Prior to Admission medications   Medication Sig Start Date End Date Taking? Authorizing Provider  albuterol (PROVENTIL HFA;VENTOLIN HFA) 108 (90 Base) MCG/ACT inhaler Inhale 2 puffs into the lungs every 6 (six) hours as needed for wheezing or shortness of breath.    [provider]  butalbital-acetaminophen-caffeine (FIORICET, ESGIC) 50-325-40 MG tablet Take 1 tablet by mouth 2 (two) times daily as needed for headache.    [provider]  Cholecalciferol (D3 MAXIMUM STRENGTH) 5000 units capsule Take 5,000 Units by mouth once a week.    [provider]  desogestrel-ethinyl estradiol (APRI,EMOQUETTE,SOLIA) 0.15-30 MG-MCG tablet Take one active pill daily, skip placebo pills, for menses suppression 06/08/16   [provider]  DULoxetine (CYMBALTA) 60 MG capsule Take 90 mg by mouth daily.     [provider]  gabapentin (NEURONTIN) 300 MG capsule Take 300 mg by mouth 3 (three) times daily.    [provider]  hydrOXYzine (ATARAX/VISTARIL) 10 MG tablet Take 10 mg by mouth 3 (three) times daily as needed.    [provider]  nortriptyline (PAMELOR) 10 MG capsule Take 2 capsules (20 mg total) by mouth at bedtime. 07/08/18   Levert Feinstein, MD  pantoprazole (PROTONIX)  40 MG tablet Take 40 mg by mouth daily.    [provider]  SUMAtriptan (IMITREX) 100 MG tablet Take 100 mg by mouth every 2 (two) hours as needed for migraine. May repeat in 2 hours if headache persists or recurs.    [provider]  triamcinolone (NASACORT ALLERGY 24HR) 55 MCG/ACT AERO nasal inhaler Place 2 sprays into the nose daily.    [provider]  UNABLE TO FIND Take 1 Dose by mouth daily. HEMP    [provider]    Family History Family History  Problem Relation Age of Onset  . Prostate cancer Neg Hx   . Kidney disease Neg Hx   . Kidney cancer Neg Hx     Social History Social History   Tobacco  Use  . Smoking status: Never Smoker  . Smokeless tobacco: Never Used  Substance Use Topics  . Alcohol use: No  . Drug use: No     Allergies   Morphine and related   Review of Systems Review of Systems  Unable to perform ROS: Acuity of condition     Physical Exam Updated Vital Signs BP 105/64 (BP Location: Right Arm)   Pulse 91   Temp 98.6 F (37 C) (Oral)   Resp 17   SpO2 100%   Physical Exam  Constitutional: She is oriented to person, place, and time. She appears well-developed.  HENT:  Head: Atraumatic.  Patient is wearing earplugs and has her head covered with a blanket.  Eyes: Pupils are equal, round, and reactive to light.  Neck: Neck supple.  Cardiovascular: Normal rate.  Pulmonary/Chest: Breath sounds normal.  Abdominal: There is no tenderness.  Neurological: She is alert and oriented to person, place, and time.  Psychiatric:  Patient is wearing earplugs and has her head covered with a blanket.     ED Treatments / Results  Labs (all labs ordered are listed, but only abnormal results are displayed) Labs Reviewed  I-STAT CHEM 8, ED - Abnormal; Notable for the following components:      Result Value   Potassium 3.4 (*)    BUN 4 (*)    All other components within normal limits  CBC  I-STAT BETA HCG BLOOD, ED ([facility], [facility], [facility] ONLY)    EKG None  Radiology No results found.  Procedures Procedures (including critical care time)  Medications Ordered in ED Medications  ketorolac (TORADOL) 30 MG/ML injection 30 mg (30 mg Intravenous Given 07/22/18 1024)  prochlorperazine (COMPAZINE) injection 10 mg (10 mg Intravenous Given 07/22/18 1023)  diphenhydrAMINE (BENADRYL) injection 12.5 mg (12.5 mg Intravenous Given 07/22/18 1024)  sodium chloride 0.9 % bolus 500 mL (0 mLs Intravenous Stopped 07/22/18 1126)     Initial Impression / Assessment and Plan / ED Course  I have reviewed the triage vital signs and the nursing notes.  Pertinent labs & imaging  results that were available during my care of the patient were reviewed by me and considered in my medical decision making (see chart for details).     Patient with headache.  History of chronic headaches.  Feels better after migraine cocktail.  Has recently seen neurology for the same.  MRI planned.  Discharge home.  Final Clinical Impressions(s) / ED Diagnoses   Final diagnoses:  Migraine with status migrainosus, not intractable, unspecified migraine type    ED Discharge Orders    None       Benjiman Core, MD 07/22/18 1559

## 2018-07-22 NOTE — ED Notes (Signed)
Patient is resting comfortably. 

## 2018-07-24 ENCOUNTER — Encounter

## 2018-07-27 ENCOUNTER — Ambulatory Visit
Admission: RE | Admit: 2018-07-27 | Discharge: 2018-07-27 | Disposition: A | Discharge status: Home / Self Care | Payer: Managed Care, Other (non HMO) | Source: Ambulatory Visit | Attending: Neurology | Admitting: Neurology

## 2018-07-27 DIAGNOSIS — R202 Paresthesia of skin: Secondary | ICD-10-CM | POA: Diagnosis not present

## 2018-07-27 DIAGNOSIS — G43709 Chronic migraine without aura, not intractable, without status migrainosus: Secondary | ICD-10-CM

## 2018-07-27 DIAGNOSIS — IMO0002 Reserved for concepts with insufficient information to code with codable children: Secondary | ICD-10-CM

## 2018-07-28 ENCOUNTER — Telehealth: Payer: Self-pay | Admitting: Neurology

## 2018-07-28 ENCOUNTER — Encounter: Payer: Self-pay | Admitting: Physical Therapy

## 2018-07-28 ENCOUNTER — Ambulatory Visit: Payer: Managed Care, Other (non HMO) | Attending: Obstetrics & Gynecology | Admitting: Physical Therapy

## 2018-07-28 DIAGNOSIS — R278 Other lack of coordination: Secondary | ICD-10-CM | POA: Insufficient documentation

## 2018-07-28 DIAGNOSIS — R252 Cramp and spasm: Secondary | ICD-10-CM | POA: Diagnosis present

## 2018-07-28 DIAGNOSIS — R293 Abnormal posture: Secondary | ICD-10-CM | POA: Insufficient documentation

## 2018-07-28 DIAGNOSIS — M6281 Muscle weakness (generalized): Secondary | ICD-10-CM | POA: Insufficient documentation

## 2018-07-28 NOTE — Telephone Encounter (Signed)
Please call patient, MRI of brain showed no significant abnormalities.  IMPRESSION: This MRI of the brain without contrast shows the following: 1.    Couple small T2/FLAIR hyperintense foci in the hemispheres.  This is a nonspecific finding and most likely represents sequela of migraine or minimal chronic microvascular ischemic changes.  Demyelination would be less likely. 2.    There are no acute findings.

## 2018-07-28 NOTE — Telephone Encounter (Signed)
Left patient a detailed message, with results, on voicemail (ok per DPR).  Provided our number to call back with any questions.  

## 2018-07-28 NOTE — Therapy (Signed)
[facility] [address] [place], [place], [address] Phone: [phone]   Fax:  [phone]  Physical Therapy Treatment  Patient Details  Name: Charlene Ferguson MRN: [id number] Date of Birth: 1985/11/09 Referring Provider (PT): Dr. Daleen Squibb Abu-Aldnadi   Encounter Date: 07/28/2018  PT End of Session - 07/28/18 1635    Visit Number  10    Date for PT Re-Evaluation  09/10/18    Authorization Type  [employer]    PT Start Time  1632    PT Stop Time  1700    PT Time Calculation (min)  28 min    Activity Tolerance  Patient tolerated treatment well;No increased pain    Behavior During Therapy  WFL for tasks assessed/performed       Past Medical History:  Diagnosis Date  . Anxiety   . Depression   . Fibromyalgia   . Heart murmur   . IBS (irritable bowel syndrome)   . Kidney stones   . Migraine   . Motion sickness    car back seat  . Neuropathy   . Ovarian cyst 2010   bilateral side. R cyst has been removed 2016  . SBO (small bowel obstruction) (HCC)    2010  . 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 ABDOMINAL SURGERY    . NASAL SEPTOPLASTY W/ TURBINOPLASTY Bilateral 11/21/2017   Procedure: NASAL SEPTOPLASTY WITH TURBINATE REDUCTION;  Surgeon: Vernie Murders, MD;  Location: [facility];  Service: ENT;  Laterality: Bilateral;  . OVARIAN CYST SURGERY    . SMALL INTESTINE SURGERY     at birth to remove volvulus    There were no vitals filed for this visit.  Subjective Assessment - 07/28/18 1633    Subjective  I feel the same since last visit. 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 I had a MRI of my head and it was negative.     Pertinent History  Hx of volvulus with 4 in of small intestines removed at birth, R ovarian cyst removed, small bowel obstruction.     Patient Stated Goals  stop pain and  decrease constipation    Currently in Pain?  Yes    Pain Score  7     Pain Location  Abdomen    Pain Orientation  Lower    Pain Descriptors / Indicators  Sore;Spasm    Pain Type  Chronic pain    Pain Onset  1 to 4 weeks ago    Pain Frequency  Constant    Aggravating Factors   stress related, increased activity    Pain Relieving Factors  rest    Multiple Pain Sites  No                       [facility] Adult PT Treatment/Exercise - 07/28/18 0001      Therapeutic Activites    Therapeutic Activities  Other Therapeutic Activities    Other Therapeutic Activities  postural education on how sitting upright will reduce pain       Manual Therapy   Manual Therapy  Soft tissue mobilization;Myofascial release    Soft tissue mobilization  abdominals, diaphgram, along the abdominal scars    Myofascial Release  along the scar, around the umbilicus, right lower quadrant, right lower transverse abdominus  PT Short Term Goals - 03/03/18 1701      PT SHORT TERM GOAL #2   Title  Pt will demo decreased tenderness with palpation over areas of the abdomina scar in order to promote motility and  bowel movements    Time  4    Period  Weeks    Status  Achieved      PT SHORT TERM GOAL #3   Title  Pt will demo increased diaphragmatic excursion and pelvic floor lengthening in order to promote improved toileting mechanics     Time  4    Period  Weeks    Status  Achieved      PT SHORT TERM GOAL #4   Title  understand how to toilet correctly to reduce straing and fully empty her bowels    Time  4    Period  Weeks    Status  Achieved        PT Long Term Goals - 07/16/18 1614      PT LONG TERM GOAL #1   Title  independent wtih HEP and understand how to progress herself    Baseline  still learning    Time  8    Period  Weeks    Status  On-going    Target Date  09/10/18      PT LONG TERM GOAL #2   Title  pain with penetration improved >/= 75% due to improved  tissue mobility    Time  12    Period  Weeks    Status  Achieved      PT LONG TERM GOAL #3   Title  improved abdominal scar mobilty so abdominal pain decreased >/= 75%     Time  8    Period  Weeks    Status  On-going    Target Date  09/10/18      PT LONG TERM GOAL #4   Title  pain with daily tasks decreased >/= 75% due to improved core and hip strength    Time  8    Period  Weeks    Status  On-going    Target Date  09/10/18      PT LONG TERM GOAL #5   Title  pain with vaginal exam decreased >/= 75% due to ability to relax the pelvic floor tissues and bulge    Time  12    Period  Weeks    Status  Achieved      PT LONG TERM GOAL #6   Title  bowel movements decreased >/= 5 per day due to improve tissue mobility and reduction in scar tightness    Baseline  bowel movements 3-4 per day    Time  12    Period  Weeks    Status  Achieved      PT LONG TERM GOAL #7   Title  urinary leakage improved >/= 75% with activity    Time  8    Period  Weeks    Status  New    Target Date  09/10/18      PT LONG TERM GOAL #8   Title  walking pain decreased >/= 75% due to increased strength     Time  8    Period  Weeks    Status  New    Target Date  09/10/18            Plan - 07/28/18 1636    Clinical Impression Statement  Patient had  many good bowel sounds with manual techniques. Patient will sit with a slouched posture due to depression and pain.  Patient is having issue at work that are not helping her with her depression.  After therapy pain decreased and patient seemed happier.  Patient will benefit from skilled therapy to reduce pain while working on strength so she is able to function with greater ease.     Rehab Potential  Good    Clinical Impairments Affecting Rehab Potential  extensive intra-abdominal adhesive disease; history of recurrent ovarian cysts; IBS; constipation; mood disorder; myofascial abdominal/pelvic dysfunction    PT Duration  8 weeks    PT  Treatment/Interventions  Biofeedback;Cryotherapy;Physicist, medical;Therapeutic exercise;Therapeutic activities;Neuromuscular re-education;Patient/family education;Manual techniques;Passive range of motion;Dry needling    PT Next Visit Plan  adominal scar release and soft tissue work inner thigh and possible pelvic floor, postural strength; further assess the pelvic floor    PT Home Exercise Plan  Access Code: [email or web address]    Consulted and Agree with Plan of Care  Patient       Patient will benefit from skilled therapeutic intervention in order to improve the following deficits and impairments:  Pain, Increased fascial restricitons, Decreased coordination, Decreased mobility, Decreased scar mobility, Increased muscle spasms, Impaired tone, Decreased activity tolerance, Decreased endurance, Decreased range of motion, Decreased strength, Decreased balance  Visit Diagnosis: Muscle weakness (generalized)  Other lack of coordination  Cramp and spasm  Abnormal posture     Problem List Patient Active Problem List   Diagnosis Date Noted  . Paresthesia 07/08/2018  . Chronic migraine 07/08/2018    Eulis Foster, PT 07/28/18 5:06 PM   [facility] [address] [place], [place], [address] Phone: [phone]   Fax:  [phone]  Name: Charlene Ferguson MRN: [id number] Date of Birth: [birth year]

## 2018-07-29 NOTE — Telephone Encounter (Signed)
Rutherford Nail: [id number] (exp. 07/24/18 to 10/22/18) patient had MRI on 07/27/18 at GI.

## 2018-07-30 ENCOUNTER — Other Ambulatory Visit: Payer: Self-pay | Admitting: Neurology

## 2018-08-05 ENCOUNTER — Ambulatory Visit: Payer: Managed Care, Other (non HMO) | Admitting: Physical Therapy

## 2018-08-12 ENCOUNTER — Ambulatory Visit: Payer: Managed Care, Other (non HMO) | Admitting: Physical Therapy

## 2018-08-12 ENCOUNTER — Encounter: Payer: Self-pay | Admitting: Physical Therapy

## 2018-08-12 DIAGNOSIS — R278 Other lack of coordination: Secondary | ICD-10-CM

## 2018-08-12 DIAGNOSIS — R252 Cramp and spasm: Secondary | ICD-10-CM

## 2018-08-12 DIAGNOSIS — M6281 Muscle weakness (generalized): Secondary | ICD-10-CM

## 2018-08-12 DIAGNOSIS — R293 Abnormal posture: Secondary | ICD-10-CM

## 2018-08-12 NOTE — Therapy (Signed)
[facility] [address] [place], [place], [address] Phone: [phone]   Fax:  [phone]  Physical Therapy Treatment  Patient Details  Name: Charlene CapersJuana H Ferguson MRN: [id number] Date of Birth: 1986/01/13 Referring Provider (PT): Dr. Daleen SquibbNoor Dasouki Abu-Aldnadi   Encounter Date: 08/12/2018  PT End of Session - 08/12/18 1543    Visit Number  11    Date for PT Re-Evaluation  09/10/18    Authorization Type  [employer]    PT Start Time  1537   came late   PT Stop Time  1615    PT Time Calculation (min)  38 min    Activity Tolerance  Patient tolerated treatment well;No increased pain    Behavior During Therapy  WFL for tasks assessed/performed       Past Medical History:  Diagnosis Date  . Anxiety   . Depression   . Fibromyalgia   . Heart murmur   . IBS (irritable bowel syndrome)   . Kidney stones   . Migraine   . Motion sickness    car back seat  . Neuropathy   . Ovarian cyst 2010   bilateral side. R cyst has been removed 2016  . SBO (small bowel obstruction) (HCC)    2010  . 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 ABDOMINAL SURGERY    . NASAL SEPTOPLASTY W/ TURBINOPLASTY Bilateral 11/21/2017   Procedure: NASAL SEPTOPLASTY WITH TURBINATE REDUCTION;  Surgeon: Vernie MurdersJuengel, Paul, MD;  [facility];  Service: ENT;  Laterality: Bilateral;  . OVARIAN CYST SURGERY    . SMALL INTESTINE SURGERY     at birth to remove volvulus    There were no vitals filed for this visit.  Subjective Assessment - 08/12/18 1540    Subjective  Last week I was in alot of pain with the ovarian pain. I have fibroid in my uterus and swelling.     Pertinent History  Hx of volvulus with 4 in of small intestines removed at birth, R ovarian cyst removed, small bowel obstruction.     Patient Stated Goals  stop pain and decrease  constipation    Currently in Pain?  Yes    Pain Score  7     Pain Location  Abdomen   back   Pain Orientation  Right;Lower;Mid    Pain Descriptors / Indicators  Throbbing;Sharp    Pain Type  Chronic pain    Pain Onset  1 to 4 weeks ago    Pain Frequency  Constant    Aggravating Factors   just hurts    Pain Relieving Factors  rest    Multiple Pain Sites  No                    Pelvic Floor Special Questions - 08/12/18 0001    Pelvic Floor Internal Exam  Patient confirmed identification and approves treatment of the muscles    Exam Type  Vaginal    Palpation  tenderness located in pelvic floor muscles, when palpate the right obturator internist referrs pain into the right lower quadrant        [facility] - 08/12/18 0001      Manual Therapy   Manual Therapy  Soft tissue mobilization;Internal Pelvic Floor    Soft tissue mobilization  right quadratus, right iliopsoas  Internal Pelvic Floor  bil. obturator internist and levator ani using fascial tecniques that are gentle        Trigger Point Dry Needling - 08/12/18 1618    Consent Given?  Yes    Muscles Treated Upper Body  Quadratus Lumborum;Iliopsoas   right   Muscles Treated Lower Body  --   trigger point response, elongation of muscle            PT Short Term Goals - 03/03/18 1701      PT SHORT TERM GOAL #2   Title  Pt will demo decreased tenderness with palpation over areas of the abdomina scar in order to promote motility and  bowel movements    Time  4    Period  Weeks    Status  Achieved      PT SHORT TERM GOAL #3   Title  Pt will demo increased diaphragmatic excursion and pelvic floor lengthening in order to promote improved toileting mechanics     Time  4    Period  Weeks    Status  Achieved      PT SHORT TERM GOAL #4   Title  understand how to toilet correctly to reduce straing and fully empty her bowels    Time  4    Period  Weeks    Status  Achieved         PT Long Term Goals - 08/12/18 1702      PT LONG TERM GOAL #1   Title  independent wtih HEP and understand how to progress herself    Baseline  still learning    Time  8    Status  On-going      PT LONG TERM GOAL #2   Title  pain with penetration improved >/= 75% due to improved tissue mobility    Baseline  50% BETTER    Time  12    Period  Weeks    Status  Achieved      PT LONG TERM GOAL #3   Title  improved abdominal scar mobilty so abdominal pain decreased >/= 75%     Time  8    Period  Weeks    Status  On-going      PT LONG TERM GOAL #4   Title  pain with daily tasks decreased >/= 75% due to improved core and hip strength    Baseline  flare-up this week    Time  8    Period  Weeks    Status  On-going      PT LONG TERM GOAL #5   Title  pain with vaginal exam decreased >/= 75% due to ability to relax the pelvic floor tissues and bulge    Time  12    Period  Weeks    Status  Achieved      PT LONG TERM GOAL #6   Title  bowel movements decreased >/= 5 per day due to improve tissue mobility and reduction in scar tightness    Baseline  bowel movements 3-4 per day    Period  Weeks    Status  Achieved      PT LONG TERM GOAL #7   Title  urinary leakage improved >/= 75% with activity    Time  8    Period  Weeks    Status  On-going      PT LONG TERM GOAL #8   Title  walking pain decreased >/= 75% due to  increased strength     Time  8    Period  Weeks    Status  On-going            Plan - 08/12/18 1657    Clinical Impression Statement  Patient reports after manual work pain decreased to a 5/10 and she was able to relax her pelvic floor. Patient is having trouble being aware of when her pelvic floor tightens and when it relaxes. Patient was apprehensive with internal soft tissue work to the pelvic floor but she decided to try. Patient pain level was 7/10 due to her reporting that her ovarian cyst and fibroids were acting up.  When talking to patient, she  seemed to have less anxiety and smiled more. Patient will benefit from skilled therapy to reduce muscle spasms of the pelvic floor and increase her endurance to tolerate daily tasks.     Rehab Potential  Good    Clinical Impairments Affecting Rehab Potential  extensive intra-abdominal adhesive disease; history of recurrent ovarian cysts; IBS; constipation; mood disorder; myofascial abdominal/pelvic dysfunction    PT Frequency  1x / week    PT Duration  8 weeks    PT Treatment/Interventions  Biofeedback;Cryotherapy;Physicist, medical;Therapeutic exercise;Therapeutic activities;Neuromuscular re-education;Patient/family education;Manual techniques;Passive range of motion;Dry needling    PT Next Visit Plan   pelvic floor, postural strength; further assess the pelvic floor; education on being aware when the pelvic lfoor is relaxed    PT Home Exercise Plan  Access Code: [email or web address]    Consulted and Agree with Plan of Care  Patient       Patient will benefit from skilled therapeutic intervention in order to improve the following deficits and impairments:  Pain, Increased fascial restricitons, Decreased coordination, Decreased mobility, Decreased scar mobility, Increased muscle spasms, Impaired tone, Decreased activity tolerance, Decreased endurance, Decreased range of motion, Decreased strength, Decreased balance  Visit Diagnosis: Muscle weakness (generalized)  Other lack of coordination  Cramp and spasm  Abnormal posture     Problem List Patient Active Problem List   Diagnosis Date Noted  . Paresthesia 07/08/2018  . Chronic migraine 07/08/2018    Eulis Foster, PT 08/12/18 5:04 PM   [facility] [address], [address] [place], [place], [address] Phone: [phone]   Fax:  [phone]  Name: KATHLEENA FREEMAN MRN: [id number] Date of Birth: [birth year]

## 2018-08-26 ENCOUNTER — Ambulatory Visit: Payer: Managed Care, Other (non HMO) | Attending: Obstetrics & Gynecology | Admitting: Physical Therapy

## 2018-08-26 ENCOUNTER — Encounter: Payer: Self-pay | Admitting: Physical Therapy

## 2018-08-26 DIAGNOSIS — M6281 Muscle weakness (generalized): Secondary | ICD-10-CM | POA: Insufficient documentation

## 2018-08-26 DIAGNOSIS — R278 Other lack of coordination: Secondary | ICD-10-CM | POA: Insufficient documentation

## 2018-08-26 DIAGNOSIS — R252 Cramp and spasm: Secondary | ICD-10-CM | POA: Diagnosis present

## 2018-08-26 NOTE — Therapy (Signed)
[facility] [address], [address] [place], [place], [address] Phone: [phone]   Fax:  [phone]  Physical Therapy Treatment  Patient Details  Name: Charlene Ferguson MRN: [id number] Date of Birth: 02-17-1986 Referring Provider (PT): Dr. Daleen SquibbNoor Dasouki Abu-Aldnadi   Encounter Date: 08/26/2018  PT End of Session - 08/26/18 1539    Visit Number  12    Date for PT Re-Evaluation  09/10/18    Authorization Type  [employer]    PT Start Time  1530    PT Stop Time  1610    PT Time Calculation (min)  40 min    Activity Tolerance  Patient tolerated treatment well;No increased pain    Behavior During Therapy  WFL for tasks assessed/performed       Past Medical History:  Diagnosis Date  . Anxiety   . Depression   . Fibromyalgia   . Heart murmur   . IBS (irritable bowel syndrome)   . Kidney stones   . Migraine   . Motion sickness    car back seat  . Neuropathy   . Ovarian cyst 2010   bilateral side. R cyst has been removed 2016  . SBO (small bowel obstruction) (HCC)    2010  . 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 ABDOMINAL SURGERY    . NASAL SEPTOPLASTY W/ TURBINOPLASTY Bilateral 11/21/2017   Procedure: NASAL SEPTOPLASTY WITH TURBINATE REDUCTION;  Surgeon: Vernie MurdersJuengel, Paul, MD;  [facility];  Service: ENT;  Laterality: Bilateral;  . OVARIAN CYST SURGERY    . SMALL INTESTINE SURGERY     at birth to remove volvulus    There were no vitals filed for this visit.  Subjective Assessment - 08/26/18 1535    Subjective  I am having surgery next month to remove the right ovary. I am now on a new birth control and now I am having periods. With this period I am having blood clots. I am having my period right now.     Pertinent History  Hx of volvulus with 4 in of small intestines removed at birth, R ovarian cyst  removed, small bowel obstruction.     Patient Stated Goals  stop pain and decrease constipation    Currently in Pain?  Yes    Pain Score  7     Pain Location  Abdomen    Pain Orientation  Right;Lower;Mid    Pain Descriptors / Indicators  Throbbing;Sharp    Pain Type  Chronic pain    Pain Onset  1 to 4 weeks ago    Pain Frequency  Constant    Aggravating Factors   just hurts    Pain Relieving Factors  rest    Multiple Pain Sites  No         [facility] PT Assessment - 08/26/18 0001      Strength   Right Hip Extension  4/5    Right Hip External Rotation   5/5    Right Hip Internal Rotation  5/5    Right Hip ABduction  4/5    Right Hip ADduction  4/5    Left Hip Extension  4/5    Left Hip External Rotation  5/5    Left Hip Internal Rotation  5/5    Left Hip ABduction  4/5  [facility] Adult PT Treatment/Exercise - 08/26/18 0001      Manual Therapy   Manual Therapy  Myofascial release    Internal Pelvic Floor  release through the 3 planes of fascia for the diaphgram, and urogenital diaphgram; release of the right and left lower quadrant, release over the bladder area.                PT Short Term Goals - 03/03/18 1701      PT SHORT TERM GOAL #2   Title  Pt will demo decreased tenderness with palpation over areas of the abdomina scar in order to promote motility and  bowel movements    Time  4    Period  Weeks    Status  Achieved      PT SHORT TERM GOAL #3   Title  Pt will demo increased diaphragmatic excursion and pelvic floor lengthening in order to promote improved toileting mechanics     Time  4    Period  Weeks    Status  Achieved      PT SHORT TERM GOAL #4   Title  understand how to toilet correctly to reduce straing and fully empty her bowels    Time  4    Period  Weeks    Status  Achieved        PT Long Term Goals - 08/26/18 1614      PT LONG TERM GOAL #1   Baseline  still learning    Time  8    Period  Weeks    Status   On-going      PT LONG TERM GOAL #2   Title  pain with penetration improved >/= 75% due to improved tissue mobility    Baseline  50% BETTER    Time  12    Period  Weeks    Status  Achieved      PT LONG TERM GOAL #3   Title  improved abdominal scar mobilty so abdominal pain decreased >/= 75%     Time  8    Period  Weeks    Status  On-going      PT LONG TERM GOAL #4   Title  pain with daily tasks decreased >/= 75% due to improved core and hip strength    Time  8    Period  Weeks    Status  On-going      PT LONG TERM GOAL #5   Title  pain with vaginal exam decreased >/= 75% due to ability to relax the pelvic floor tissues and bulge    Time  12    Period  Weeks    Status  Achieved      PT LONG TERM GOAL #6   Title  bowel movements decreased >/= 5 per day due to improve tissue mobility and reduction in scar tightness    Baseline  bowel movements 3-4 per day    Time  12    Period  Weeks    Status  Achieved      PT LONG TERM GOAL #7   Time  8    Period  Weeks    Status  On-going      PT LONG TERM GOAL #8   Title  walking pain decreased >/= 75% due to increased strength     Time  8    Period  Weeks    Status  On-going  Plan - 08/26/18 1541    Clinical Impression Statement  After therapy patient had no pain. Patient was have her menstrual cycle today therefore we did not do any internal work. Patient had many fascial restrictions in the abdomen. Patient will be having surgery the end of the month to remove her right ovary. Patient was relaxed after therapy. Patient will benefit from skilled therapy to reduce muscle spasms of the pelvic floor and increase her endurance to toerate daily tasks.     Rehab Potential  Good    Clinical Impairments Affecting Rehab Potential  extensive intra-abdominal adhesive disease; history of recurrent ovarian cysts; IBS; constipation; mood disorder; myofascial abdominal/pelvic dysfunction    PT Frequency  1x / week    PT Duration   8 weeks    PT Treatment/Interventions  Biofeedback;Cryotherapy;Physicist, medical;Therapeutic exercise;Therapeutic activities;Neuromuscular re-education;Patient/family education;Manual techniques;Passive range of motion;Dry needling    PT Next Visit Plan   pelvic floor, postural strength; further assess the pelvic floor; education on being aware when the pelvic lfoor is relaxed    PT Home Exercise Plan  Access Code: [email or web address]    Consulted and Agree with Plan of Care  Patient       Patient will benefit from skilled therapeutic intervention in order to improve the following deficits and impairments:  Pain, Increased fascial restricitons, Decreased coordination, Decreased mobility, Decreased scar mobility, Increased muscle spasms, Impaired tone, Decreased activity tolerance, Decreased endurance, Decreased range of motion, Decreased strength, Decreased balance  Visit Diagnosis: Muscle weakness (generalized)  Other lack of coordination  Cramp and spasm     Problem List Patient Active Problem List   Diagnosis Date Noted  . Paresthesia 07/08/2018  . Chronic migraine 07/08/2018    Eulis Foster, PT 08/26/18 4:16 PM   [facility] [address] [place], [place], [address] Phone: [phone]   Fax:  [phone]  Name: Charlene Ferguson MRN: [id number] Date of Birth: 10-Jan-1986

## 2018-09-02 ENCOUNTER — Encounter: Payer: Self-pay | Admitting: Physical Therapy

## 2018-09-02 ENCOUNTER — Ambulatory Visit: Payer: Managed Care, Other (non HMO) | Admitting: Physical Therapy

## 2018-09-02 DIAGNOSIS — M6281 Muscle weakness (generalized): Secondary | ICD-10-CM

## 2018-09-02 DIAGNOSIS — R252 Cramp and spasm: Secondary | ICD-10-CM

## 2018-09-02 DIAGNOSIS — R278 Other lack of coordination: Secondary | ICD-10-CM

## 2018-09-02 NOTE — Therapy (Signed)
[facility] [address], [address] [place], [place], [address] Phone: [phone]   Fax:  [phone]  Physical Therapy Treatment  Patient Details  Name: Charlene Ferguson MRN: [id number] Date of Birth: 29-Sep-1985 Referring Provider (PT): Dr. Daleen Squibb Abu-Aldnadi   Encounter Date: 09/02/2018  PT End of Session - 09/02/18 1536    Visit Number  13    Date for PT Re-Evaluation  09/10/18    Authorization Type  [employer]    PT Start Time  1530    PT Stop Time  1610    PT Time Calculation (min)  40 min    Activity Tolerance  Patient tolerated treatment well;No increased pain    Behavior During Therapy  WFL for tasks assessed/performed       Past Medical History:  Diagnosis Date  . Anxiety   . Depression   . Fibromyalgia   . Heart murmur   . IBS (irritable bowel syndrome)   . Kidney stones   . Migraine   . Motion sickness    car back seat  . Neuropathy   . Ovarian cyst 2010   bilateral side. R cyst has been removed 2016  . SBO (small bowel obstruction) (HCC)    2010  . 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 ABDOMINAL SURGERY    . NASAL SEPTOPLASTY W/ TURBINOPLASTY Bilateral 11/21/2017   Procedure: NASAL SEPTOPLASTY WITH TURBINATE REDUCTION;  Surgeon: Vernie Murders, MD;  Location: [facility];  Service: ENT;  Laterality: Bilateral;  . OVARIAN CYST SURGERY    . SMALL INTESTINE SURGERY     at birth to remove volvulus    There were no vitals filed for this visit.  Subjective Assessment - 09/02/18 1536    Subjective  I went to the hospital the other day. I am backed up. The small bowel is swollen. I was having severe nerve pain. I have a UTI. I have completed my medication for a UTI.     Pertinent History  Hx of volvulus with 4 in of small intestines removed at birth, R ovarian cyst removed, small bowel  obstruction.     Patient Stated Goals  stop pain and decrease constipation    Currently in Pain?  Yes    Pain Score  8     Pain Location  Abdomen    Pain Orientation  Right;Lower;Mid    Pain Descriptors / Indicators  Throbbing;Sharp    Pain Type  Chronic pain    Pain Onset  1 to 4 weeks ago    Pain Frequency  Constant    Aggravating Factors   just hurts    Pain Relieving Factors  rest    Multiple Pain Sites  No                    Pelvic Floor Special Questions - 09/02/18 0001    Pelvic Floor Internal Exam  Patient confirmed identification and approves treatment of the muscles    Exam Type  Vaginal    Palpation  tenderness located on the right obturator internist and levator ani, when obturator internist palpated referred pain to the right lower abdominal    Strength  fair squeeze, definite lift        [facility] Adult PT Treatment/Exercise - 09/02/18 0001      Manual Therapy  Manual Therapy  Internal Pelvic Floor    Manual therapy comments  educated patient on how to perform self soft tissue work to the right pelvic floor muscles    Internal Pelvic Floor  soft tissue work to the right obturator internist, levator ani with trigger point releases throughout the muscles             PT Education - 09/02/18 1614    Education provided  Yes    Education Details  information on pelvic floor massager ; education on how to massage her own pelvic floor    Person(s) Educated  Patient    Methods  Explanation    Comprehension  Verbalized understanding;Returned demonstration;Verbal cues required       PT Short Term Goals - 03/03/18 1701      PT SHORT TERM GOAL #2   Title  Pt will demo decreased tenderness with palpation over areas of the abdomina scar in order to promote motility and  bowel movements    Time  4    Period  Weeks    Status  Achieved      PT SHORT TERM GOAL #3   Title  Pt will demo increased diaphragmatic excursion and pelvic floor lengthening in order  to promote improved toileting mechanics     Time  4    Period  Weeks    Status  Achieved      PT SHORT TERM GOAL #4   Title  understand how to toilet correctly to reduce straing and fully empty her bowels    Time  4    Period  Weeks    Status  Achieved        PT Long Term Goals - 08/26/18 1614      PT LONG TERM GOAL #1   Baseline  still learning    Time  8    Period  Weeks    Status  On-going      PT LONG TERM GOAL #2   Title  pain with penetration improved >/= 75% due to improved tissue mobility    Baseline  50% BETTER    Time  12    Period  Weeks    Status  Achieved      PT LONG TERM GOAL #3   Title  improved abdominal scar mobilty so abdominal pain decreased >/= 75%     Time  8    Period  Weeks    Status  On-going      PT LONG TERM GOAL #4   Title  pain with daily tasks decreased >/= 75% due to improved core and hip strength    Time  8    Period  Weeks    Status  On-going      PT LONG TERM GOAL #5   Title  pain with vaginal exam decreased >/= 75% due to ability to relax the pelvic floor tissues and bulge    Time  12    Period  Weeks    Status  Achieved      PT LONG TERM GOAL #6   Title  bowel movements decreased >/= 5 per day due to improve tissue mobility and reduction in scar tightness    Baseline  bowel movements 3-4 per day    Time  12    Period  Weeks    Status  Achieved      PT LONG TERM GOAL #7   Time  8    Period  Weeks    Status  On-going      PT LONG TERM GOAL #8   Title  walking pain decreased >/= 75% due to increased strength     Time  8    Period  Weeks    Status  On-going            Plan - 09/02/18 1714    Clinical Impression Statement  After therapy the right lower quadrant pain decreased. Patient has had many trigger points in the right pelvic floor muscles. Patient went to the ER due to severe abdominal pain. Patient had terminal ilietis. Patient will benefit from skilled therapy to reduce muscle spasms of the pelvic floor  and increase her endurance to tolerate daily tasks.     Rehab Potential  Good    Clinical Impairments Affecting Rehab Potential  extensive intra-abdominal adhesive disease; history of recurrent ovarian cysts; IBS; constipation; mood disorder; myofascial abdominal/pelvic dysfunction    PT Frequency  1x / week    PT Duration  8 weeks    PT Treatment/Interventions  Biofeedback;Cryotherapy;Physicist, medicallectrical Stimulation;Moist Heat;Ultrasound;Balance training;Therapeutic exercise;Therapeutic activities;Neuromuscular re-education;Patient/family education;Manual techniques;Passive range of motion;Dry needling    PT Next Visit Plan   pelvic floor, postural strength; further assess the pelvic floor; education on being aware when the pelvic lfoor is relaxed; write renewal    PT Home Exercise Plan  Access Code: [email or web address]    Consulted and Agree with Plan of Care  Patient       Patient will benefit from skilled therapeutic intervention in order to improve the following deficits and impairments:  Pain, Increased fascial restricitons, Decreased coordination, Decreased mobility, Decreased scar mobility, Increased muscle spasms, Impaired tone, Decreased activity tolerance, Decreased endurance, Decreased range of motion, Decreased strength, Decreased balance  Visit Diagnosis: Muscle weakness (generalized)  Other lack of coordination  Cramp and spasm     Problem List Patient Active Problem List   Diagnosis Date Noted  . Paresthesia 07/08/2018  . Chronic migraine 07/08/2018    Eulis Fosterheryl Emmalee Solivan, PT 09/02/18 5:18 PM   [facility] [address], [address] [place], [place], [address] Phone: [phone]   Fax:  [phone]  Name: Charlene Ferguson MRN: [id number] Date of Birth: 10/28/1985

## 2018-09-02 NOTE — Patient Instructions (Signed)
Intimate Rose Pelvic Wand Nurse, adultTrigger Point & Tender Point Release for Pelvic Floor Muscles - Men & Women - Pelvic Physical Therapy Use     [facility] [address], [address] [place], [place] [address] Phone # [phone] Fax [phone]

## 2018-09-09 ENCOUNTER — Ambulatory Visit: Payer: Managed Care, Other (non HMO) | Admitting: Physical Therapy

## 2018-09-09 ENCOUNTER — Encounter: Payer: Self-pay | Admitting: Physical Therapy

## 2018-09-09 DIAGNOSIS — M6281 Muscle weakness (generalized): Secondary | ICD-10-CM | POA: Diagnosis not present

## 2018-09-09 DIAGNOSIS — R278 Other lack of coordination: Secondary | ICD-10-CM

## 2018-09-09 DIAGNOSIS — R252 Cramp and spasm: Secondary | ICD-10-CM

## 2018-09-09 NOTE — Patient Instructions (Signed)
  Lubrication . Used for intercourse to reduce friction . Avoid ones that have glycerin, warming gels, tingling gels, icing or cooling gel, scented . Avoid parabens due to a preservative similar to female sex hormone . May need to be reapplied once or several times during sexual activity . Can be applied to both partners genitals prior to vaginal penetration to minimize friction or irritation . Prevent irritation and mucosal tears that cause post coital pain and increased the risk of vaginal and urinary tract infections . Oil-based lubricants cannot be used with condoms due to breaking them down.  Least likely to irritate vaginal tissue.  . Plant based-lubes are safe . Silicone-based lubrication are thicker and last long and used for post-menopausal women  Vaginal Lubricators Here is a list of some suggested lubricators you can use for intercourse. Use the most hypoallergenic product.  You can place on you or your partner.   Slippery Stuff  Sylk or Sliquid Natural H2O ( good  if frequent UTI's)  Blossom Organics (www.blossom-organics.com)  Luvena   Coconut oil  PJur Woman Nude- water based lubricant, amazon  Uberlube- Amazon  Aloe Vera  Yes lubricant- Amazon  Wet Platinum-Silicone, [employer], [facility]  Olive and Bee intimate cream-  www.oliveandbee.com.au Things to avoid in lubricants are glycerin, warming gels, tingling gels, icing or cooling  gels, and scented gels.  Also avoid Vaseline. KY jelly, Replens, and Astroglide kills good bacteria(lactobacilli)  Things to avoid in the vaginal area . Do not use things to irritate the vulvar area . No lotions- see below . No soaps; can use Aveeno or Calendula cleanser if needed. Must be gentle . No deodorants . No douches . Good to sleep without underwear to let the vaginal area to air out . No scrubbing: spread the lips to let warm water rinse over labias and pat dry  Creams that can be used on the Vulva Area  V  magic-amazon  Vital V Wild Yam Salve  Julva- Amazon  MoonMaid Botanical Pro-Meno Wild Yam Cream  Desert Havest Releveum or Desert Harvest Gele    [facility] [address], [address] [place], [place] [address] Phone # [phone] Fax [phone]  

## 2018-09-09 NOTE — Therapy (Signed)
[facility] [address] [place], [place], [address] Phone: [phone]   Fax:  [phone]  Physical Therapy Treatment  Patient Details  Name: Charlene Ferguson MRN: [id number] Date of Birth: 1985-09-28 Referring Provider (PT): Dr. Daleen Squibb Abu-Aldnadi   Encounter Date: 09/09/2018  PT End of Session - 09/09/18 1550    Visit Number  14    Date for PT Re-Evaluation  12/03/18    Authorization Type  [employer]    PT Start Time  1545   came late   PT Stop Time  1615    PT Time Calculation (min)  30 min    Activity Tolerance  Patient tolerated treatment well;No increased pain    Behavior During Therapy  WFL for tasks assessed/performed       Past Medical History:  Diagnosis Date  . Anxiety   . Depression   . Fibromyalgia   . Heart murmur   . IBS (irritable bowel syndrome)   . Kidney stones   . Migraine   . Motion sickness    car back seat  . Neuropathy   . Ovarian cyst 2010   bilateral side. R cyst has been removed 2016  . SBO (small bowel obstruction) (HCC)    2010  . 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 ABDOMINAL SURGERY    . NASAL SEPTOPLASTY W/ TURBINOPLASTY Bilateral 11/21/2017   Procedure: NASAL SEPTOPLASTY WITH TURBINATE REDUCTION;  Surgeon: Vernie Murders, MD;  Location: [facility];  Service: ENT;  Laterality: Bilateral;  . OVARIAN CYST SURGERY    . SMALL INTESTINE SURGERY     at birth to remove volvulus    There were no vitals filed for this visit.  Subjective Assessment - 09/09/18 1547    Subjective  My abdomen hurts due to constipation and caused the blockage. I saw the GI doctor last Friday and increased my Cymbalta and going back to [employer].     Pertinent History  Hx of volvulus with 4 in of small intestines removed at birth, R ovarian cyst removed, small bowel obstruction.     Patient Stated Goals  stop pain and decrease constipation    Currently in Pain?  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 Daleen Squibb Abu-Aldnadi    Prior Therapy  Yes for one session      Precautions   Precautions  None      Restrictions   Weight Bearing Restrictions  No      Home Environment   Living Environment  Private residence      Prior Function   Level of Independence  Independent    Vocation  Full time employment    Vocation Requirements  standing, lifting, walking, bending, jumping    Leisure  3 year old daughter      Cognition   Overall Cognitive Status  Within Functional Limits for tasks assessed      Posture/Postural Control   Posture/Postural Control  Postural limitations    Postural Limitations  Rounded Shoulders;Forward head;Flexed trunk    Posture Comments  lumbopelvic perturbations with leg movements      AROM   Lumbar Flexion  full  Lumbar Extension  full    Lumbar - Right Side Bend  full    Lumbar - Left Side Bend  full      Strength   Right Hip Extension  4/5    Right Hip External Rotation   5/5    Right Hip Internal Rotation  5/5    Right Hip ABduction  4/5    Right Hip ADduction  4/5    Left Hip Extension  4/5    Left Hip External Rotation  5/5    Left Hip Internal Rotation  5/5    Left Hip ABduction  4/5      Pelvic Dictraction   Findings  Negative                Pelvic Floor Special Questions - 09/09/18 0001    Pelvic Floor Internal Exam  Patient confirmed identification and approves treatment of the muscles    Exam Type  Vaginal    Palpation  tendernss located in bil. obturator internist and levator ani    Strength  fair squeeze, definite lift        [facility] Adult PT Treatment/Exercise - 09/09/18 0001      Manual Therapy   Manual Therapy  Internal Pelvic Floor    Manual therapy comments   educated patient on how to perfrom perineal and internal soft tissue work. Patient has difficulty due to position and small fingers. Suggested to patient she should get a pelvic floor massager device    Internal Pelvic Floor  soft tissue work to the right obturator internist, levator ani with trigger point releases throughout the muscles             PT Education - 09/09/18 1614    Education provided  Yes    Education Details  information on vaginal lubricants; educated patient on how to massage the perineal area    Person(s) Educated  Patient    Methods  Explanation;Demonstration    Comprehension  Verbalized understanding;Returned demonstration       PT Short Term Goals - 09/09/18 1615      PT SHORT TERM GOAL #1   Title  Pt will show IND with HEP    Time  4    Period  Weeks    Status  Achieved      PT SHORT TERM GOAL #2   Title  Pt will demo decreased tenderness with palpation over areas of the abdomina scar in order to promote motility and  bowel movements    Time  4    Period  Weeks    Status  Achieved      PT SHORT TERM GOAL #3   Title  Pt will demo increased diaphragmatic excursion and pelvic floor lengthening in order to promote improved toileting mechanics     Time  4    Period  Weeks    Status  Achieved      PT SHORT TERM GOAL #4   Title  understand how to toilet correctly to reduce straing and fully empty her bowels    Time  4    Period  Weeks    Status  Achieved        PT Long Term Goals - 09/09/18 1615      PT LONG TERM GOAL #1   Title  independent wtih HEP and understand how to progress herself    Baseline  still learning    Time  8    Period  Weeks  Status  On-going      PT LONG TERM GOAL #2   Title  pain with penetration improved >/= 75% due to improved tissue mobility    Baseline  ---    Time  12    Period  Weeks    Status  Achieved      PT LONG TERM GOAL #3   Title  improved abdominal scar mobilty so abdominal pain decreased >/= 75%      Time  8    Period  Weeks    Status  On-going      PT LONG TERM GOAL #4   Title  pain with daily tasks decreased >/= 75% due to improved core and hip strength    Baseline  flare-up this week    Time  8    Period  Weeks    Status  On-going      PT LONG TERM GOAL #5   Title  pain with vaginal exam decreased >/= 75% due to ability to relax the pelvic floor tissues and bulge    Time  12    Period  Weeks    Status  Achieved      PT LONG TERM GOAL #6   Title  bowel movements decreased >/= 5 per day due to improve tissue mobility and reduction in scar tightness    Baseline  bowel movements 3-4 per day    Time  12    Period  Weeks    Status  Achieved            Plan - 09/09/18 1614    Clinical Impression Statement  Patient has trigger points in the right obturator internist and levator ani. Patient is having difficulty with performing her internal soft tissue work. Patient was educated on correct lubriants she is to use for intercourse and self soft tissue work.  Patient was having increased abdominal pain due to interstinal blockage. Patient has pain with intercourse. Patient still has urinary leakage due to pelvic floor weakness and muscles in spasm. Patient will benefit from skilled therapy to reduce muscle spasms of the pelvic floor and increase her endurance to tolerate daily tasks.     Rehab Potential  Good    Clinical Impairments Affecting Rehab Potential  extensive intra-abdominal adhesive disease; history of recurrent ovarian cysts; IBS; constipation; mood disorder; myofascial abdominal/pelvic dysfunction    PT Frequency  1x / week    PT Duration  8 weeks    PT Treatment/Interventions  Biofeedback;Cryotherapy;Physicist, medicallectrical Stimulation;Moist Heat;Ultrasound;Balance training;Therapeutic exercise;Therapeutic activities;Neuromuscular re-education;Patient/family education;Manual techniques;Passive range of motion;Dry needling    PT Next Visit Plan   pelvic floor, postural strength;  further assess the pelvic floor; education on being aware when the pelvic lfoor is relaxed    PT Home Exercise Plan  Access Code: [email or web address]    Recommended Other Services  renewal note sent to MD on 09/09/2018    Consulted and Agree with Plan of Care  Patient       Patient will benefit from skilled therapeutic intervention in order to improve the following deficits and impairments:  Pain, Increased fascial restricitons, Decreased coordination, Decreased mobility, Decreased scar mobility, Increased muscle spasms, Impaired tone, Decreased activity tolerance, Decreased endurance, Decreased range of motion, Decreased strength, Decreased balance  Visit Diagnosis: Muscle weakness (generalized) - Plan: PT plan of care cert/re-cert  Other lack of coordination - Plan: PT plan of care cert/re-cert  Cramp and spasm - Plan: PT plan of care cert/re-cert  Problem List Patient Active Problem List   Diagnosis Date Noted  . Paresthesia 07/08/2018  . Chronic migraine 07/08/2018    Eulis Foster, PT 09/09/18 5:15 PM   [facility] [address], [address] [place], [place], [address] Phone: [phone]   Fax:  [phone]  Name: JHOANNA HEYDE MRN: [id number] Date of Birth: [birth year]

## 2018-09-15 ENCOUNTER — Ambulatory Visit: Payer: Managed Care, Other (non HMO) | Admitting: Physical Therapy

## 2018-09-15 ENCOUNTER — Telehealth: Payer: Self-pay | Admitting: Physical Therapy

## 2018-09-15 NOTE — Telephone Encounter (Signed)
Called patient about her not showing for her 3:30 appointment. Unable to leave a message due to mailbox is full.  Charlene Ferguson, PT @12 /23/2019@ 3:53 PM

## 2018-09-22 ENCOUNTER — Encounter: Payer: Self-pay | Admitting: Physical Therapy

## 2018-09-22 ENCOUNTER — Ambulatory Visit: Payer: Managed Care, Other (non HMO) | Admitting: Physical Therapy

## 2018-09-22 DIAGNOSIS — R252 Cramp and spasm: Secondary | ICD-10-CM

## 2018-09-22 DIAGNOSIS — M6281 Muscle weakness (generalized): Secondary | ICD-10-CM

## 2018-09-22 DIAGNOSIS — R278 Other lack of coordination: Secondary | ICD-10-CM

## 2018-09-22 NOTE — Therapy (Signed)
[facility] [address] [place], [place], [address] Phone: [phone]   Fax:  [phone]  Physical Therapy Treatment  Patient Details  Name: Charlene Ferguson MRN: [id number] Date of Birth: [birth year] Referring Provider (PT): Dr. Daleen Squibb Abu-Aldnadi   Encounter Date: 09/22/2018  PT End of Session - 09/22/18 1541    Visit Number  15    Date for PT Re-Evaluation  12/03/18    Authorization Type  [employer]    PT Start Time  1530    PT Stop Time  1630    PT Time Calculation (min)  60 min    Activity Tolerance  Patient tolerated treatment well;No increased pain    Behavior During Therapy  WFL for tasks assessed/performed       Past Medical History:  Diagnosis Date  . Anxiety   . Depression   . Fibromyalgia   . Heart murmur   . IBS (irritable bowel syndrome)   . Kidney stones   . Migraine   . Motion sickness    car back seat  . Neuropathy   . Ovarian cyst 2010   bilateral side. R cyst has been removed 2016  . SBO (small bowel obstruction) (HCC)    2010  . 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 ABDOMINAL SURGERY    . NASAL SEPTOPLASTY W/ TURBINOPLASTY Bilateral 11/21/2017   Procedure: NASAL SEPTOPLASTY WITH TURBINATE REDUCTION;  Surgeon: Vernie Murders, MD;  Location: [facility];  Service: ENT;  Laterality: Bilateral;  . OVARIAN CYST SURGERY    . SMALL INTESTINE SURGERY     at birth to remove volvulus    There were no vitals filed for this visit.  Subjective Assessment - 09/22/18 1535    Subjective  After internal work I have bad flare-ups. Everything has been hurting. I have back pain that hurts alot. Today my abdomen has become worse. I have had bowel movements and not completely come out.     Pertinent History  Hx of volvulus with 4 in of small intestines removed at birth, R ovarian cyst  removed, small bowel obstruction.     Patient Stated Goals  stop pain and decrease constipation    Currently in Pain?  Yes    Pain Score  7     Pain Location  Abdomen    Pain Orientation  Lower;Right    Pain Descriptors / Indicators  Sharp;Tightness    Pain Type  Chronic pain    Pain Onset  1 to 4 weeks ago    Pain Frequency  Constant    Aggravating Factors   just hurts    Pain Relieving Factors  rest    Multiple Pain Sites  Yes    Pain Score  7    Pain Location  Back    Pain Orientation  Upper;Mid;Lower    Pain Descriptors / Indicators  Spasm    Pain Onset  More than a month ago    Pain Frequency  Intermittent    Aggravating Factors   movement, arm movement    Pain Relieving Factors  rest                       [facility] Adult PT Treatment/Exercise - 09/22/18 0001      Modalities   Modalities  Electrical Stimulation;Moist Heat  Moist Heat Therapy   Number Minutes Moist Heat  20 Minutes    Moist Heat Location  Lumbar Spine   abdomen     Electrical Stimulation   Electrical Stimulation Location  sides of T11, lower abdominal    Electrical Stimulation Action  IFC    Electrical Stimulation Parameters  to patient tolerance; 20 minutes    Electrical Stimulation Goals  Pain      Manual Therapy   Manual Therapy  Soft tissue mobilization;Myofascial release;Manual Lymphatic Drainage (MLD)    Soft tissue mobilization  lumbar paraspinals and quadratus; abdominal tissue    Myofascial Release  release of the fascia in the abdomen on the right and lower    Manual Lymphatic Drainage (MLD)  lower abdomen to push fluid into the lyph system       Trigger Point Dry Needling - 09/22/18 1624    Consent Given?  Yes    Muscles Treated Upper Body  --   T11-L3 multifidi bil.    Muscles Treated Lower Body  --   trigger point response; elongation of tissue            PT Short Term Goals - 09/09/18 1615      PT SHORT TERM GOAL #1   Title  Pt will show IND with HEP     Time  4    Period  Weeks    Status  Achieved      PT SHORT TERM GOAL #2   Title  Pt will demo decreased tenderness with palpation over areas of the abdomina scar in order to promote motility and  bowel movements    Time  4    Period  Weeks    Status  Achieved      PT SHORT TERM GOAL #3   Title  Pt will demo increased diaphragmatic excursion and pelvic floor lengthening in order to promote improved toileting mechanics     Time  4    Period  Weeks    Status  Achieved      PT SHORT TERM GOAL #4   Title  understand how to toilet correctly to reduce straing and fully empty her bowels    Time  4    Period  Weeks    Status  Achieved        PT Long Term Goals - 09/09/18 1615      PT LONG TERM GOAL #1   Title  independent wtih HEP and understand how to progress herself    Baseline  still learning    Time  8    Period  Weeks    Status  On-going      PT LONG TERM GOAL #2   Title  pain with penetration improved >/= 75% due to improved tissue mobility    Baseline  ---    Time  12    Period  Weeks    Status  Achieved      PT LONG TERM GOAL #3   Title  improved abdominal scar mobilty so abdominal pain decreased >/= 75%     Time  8    Period  Weeks    Status  On-going      PT LONG TERM GOAL #4   Title  pain with daily tasks decreased >/= 75% due to improved core and hip strength    Baseline  flare-up this week    Time  8    Period  Weeks    Status  On-going      PT LONG TERM GOAL #5   Title  pain with vaginal exam decreased >/= 75% due to ability to relax the pelvic floor tissues and bulge    Time  12    Period  Weeks    Status  Achieved      PT LONG TERM GOAL #6   Title  bowel movements decreased >/= 5 per day due to improve tissue mobility and reduction in scar tightness    Baseline  bowel movements 3-4 per day    Time  12    Period  Weeks    Status  Achieved            Plan - 09/22/18 1709    Clinical Impression Statement  Patient pain level after  treatment for back is 7/10 and abdominal decreased to 5/10. Patient abdominal swelling reduced after treatment. Patient did not want to have internal work done do to increased pain several days afterwards. Patient has a recent flare-up for back and abdominal pain. Patient came into therapy crying due to the pain being do bad. Patient will benefit from skilled therapy to reduce muscle spasms of the pelvic floor and increase her enurance to tolerate daily tasks.     Rehab Potential  Good    Clinical Impairments Affecting Rehab Potential  extensive intra-abdominal adhesive disease; history of recurrent ovarian cysts; IBS; constipation; mood disorder; myofascial abdominal/pelvic dysfunction    PT Frequency  1x / week    PT Duration  8 weeks    PT Treatment/Interventions  Biofeedback;Cryotherapy;Physicist, medicallectrical Stimulation;Moist Heat;Ultrasound;Balance training;Therapeutic exercise;Therapeutic activities;Neuromuscular re-education;Patient/family education;Manual techniques;Passive range of motion;Dry needling    PT Next Visit Plan   pelvic floor, postural strength; further assess the pelvic floor; education on being aware when the pelvic lfoor is relaxed    PT Home Exercise Plan  Access Code: [email or web address]    Consulted and Agree with Plan of Care  Patient       Patient will benefit from skilled therapeutic intervention in order to improve the following deficits and impairments:  Pain, Increased fascial restricitons, Decreased coordination, Decreased mobility, Decreased scar mobility, Increased muscle spasms, Impaired tone, Decreased activity tolerance, Decreased endurance, Decreased range of motion, Decreased strength, Decreased balance  Visit Diagnosis: Muscle weakness (generalized)  Other lack of coordination  Cramp and spasm     Problem List Patient Active Problem List   Diagnosis Date Noted  . Paresthesia 07/08/2018  . Chronic migraine 07/08/2018    Eulis Fosterheryl Aziz Slape, PT 09/22/18 5:14 PM   [facility] [address] [place], [place], [address] Phone: [phone]   Fax:  [phone]  Name: Charlene Ferguson MRN: [id number] Date of Birth: [birth year]

## 2018-10-09 ENCOUNTER — Ambulatory Visit: Payer: Managed Care, Other (non HMO) | Attending: Obstetrics & Gynecology | Admitting: Physical Therapy

## 2018-10-09 ENCOUNTER — Telehealth: Payer: Self-pay | Admitting: Physical Therapy

## 2018-10-09 DIAGNOSIS — R252 Cramp and spasm: Secondary | ICD-10-CM | POA: Insufficient documentation

## 2018-10-09 DIAGNOSIS — R278 Other lack of coordination: Secondary | ICD-10-CM | POA: Insufficient documentation

## 2018-10-09 DIAGNOSIS — M6281 Muscle weakness (generalized): Secondary | ICD-10-CM | POA: Insufficient documentation

## 2018-10-09 DIAGNOSIS — R293 Abnormal posture: Secondary | ICD-10-CM | POA: Insufficient documentation

## 2018-10-09 NOTE — Telephone Encounter (Signed)
Called patient about her 4:15 appointment she no-showed for. Patient reported she forgot about it. Therapist reminded patient about her 10/15/2018 appointment at 3:30.  Eulis Foster, PT @1 /16/2020@ 4:52 PM

## 2018-10-15 ENCOUNTER — Ambulatory Visit: Payer: Managed Care, Other (non HMO) | Admitting: Physical Therapy

## 2018-10-15 ENCOUNTER — Encounter: Payer: Self-pay | Admitting: Physical Therapy

## 2018-10-15 DIAGNOSIS — R252 Cramp and spasm: Secondary | ICD-10-CM | POA: Diagnosis present

## 2018-10-15 DIAGNOSIS — R278 Other lack of coordination: Secondary | ICD-10-CM | POA: Diagnosis present

## 2018-10-15 DIAGNOSIS — R293 Abnormal posture: Secondary | ICD-10-CM

## 2018-10-15 DIAGNOSIS — M6281 Muscle weakness (generalized): Secondary | ICD-10-CM | POA: Diagnosis not present

## 2018-10-15 NOTE — Therapy (Signed)
[facility] [address], [place], [place], [address] Phone: [phone]   Fax:  [phone]  Physical Therapy Treatment  Patient Details  Name: Charlene Ferguson MRN: [id number] Date of Birth: 1986/03/11 Referring Provider (PT): Dr. Arbutus Ped Abu-Aldnadi   Encounter Date: 10/15/2018  PT End of Session - 10/15/18 1539    Visit Number  16    Date for PT Re-Evaluation  12/03/18    Authorization Type  [employer]    PT Start Time  1530    PT Stop Time  1608    PT Time Calculation (min)  38 min    Activity Tolerance  Patient tolerated treatment well;No increased pain    Behavior During Therapy  WFL for tasks assessed/performed       Past Medical History:  Diagnosis Date  . Anxiety   . Depression   . Fibromyalgia   . Heart murmur   . IBS (irritable bowel syndrome)   . Kidney stones   . Migraine   . Motion sickness    car back seat  . Neuropathy   . Ovarian cyst 2010   bilateral side. R cyst has been removed 2016  . SBO (small bowel obstruction) ([place])    2010  . 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 ABDOMINAL SURGERY    . NASAL SEPTOPLASTY W/ TURBINOPLASTY Bilateral 11/21/2017   Procedure: NASAL SEPTOPLASTY WITH TURBINATE REDUCTION;  Surgeon: Margaretha Sheffield, MD;  Location: [facility];  Service: ENT;  Laterality: Bilateral;  . OVARIAN CYST SURGERY    . SMALL INTESTINE SURGERY     at birth to remove volvulus    There were no vitals filed for this visit.  Subjective Assessment - 10/15/18 1535    Subjective  I am nervous about th esurgery next week. My stomach has not been good. I feel very full and constipated. I take my miralax.     Pertinent History  Hx of volvulus with 4 in of small intestines removed at birth, R ovarian cyst removed, small bowel obstruction.     Patient Stated Goals  stop pain and  decrease constipation    Currently in Pain?  Yes    Pain Score  5     Pain Location  Abdomen    Pain Orientation  Right;Lower    Pain Descriptors / Indicators  Sharp;Tightness    Pain Type  Chronic pain    Pain Onset  1 to 4 weeks ago    Pain Frequency  Constant    Aggravating Factors   just hurts    Pain Relieving Factors  rest    Multiple Pain Sites  No                       [facility] Adult PT Treatment/Exercise - 10/15/18 0001      Self-Care   Self-Care  Other Self-Care Comments    Other Self-Care Comments   education on how to use home TENS unit, put electrodes on, take electrodes off, adusting the intensity, changing the setting, precautions and contraindications      Manual Therapy   Manual Therapy  Myofascial release    Myofascial Release  release of the fascia in the abdomen on the right and lower             PT  Education - 10/15/18 1559    Education provided  Yes    Education Details  how to use home tens unit    Person(s) Educated  Patient;Other (comment)    Methods  Explanation;Demonstration    Comprehension  Returned demonstration;Verbalized understanding       PT Short Term Goals - 09/09/18 1615      PT SHORT TERM GOAL #1   Title  Pt will show IND with HEP    Time  4    Period  Weeks    Status  Achieved      PT SHORT TERM GOAL #2   Title  Pt will demo decreased tenderness with palpation over areas of the abdomina scar in order to promote motility and  bowel movements    Time  4    Period  Weeks    Status  Achieved      PT SHORT TERM GOAL #3   Title  Pt will demo increased diaphragmatic excursion and pelvic floor lengthening in order to promote improved toileting mechanics     Time  4    Period  Weeks    Status  Achieved      PT SHORT TERM GOAL #4   Title  understand how to toilet correctly to reduce straing and fully empty her bowels    Time  4    Period  Weeks    Status  Achieved        PT Long Term Goals - 10/15/18 1616       PT LONG TERM GOAL #1   Title  independent wtih HEP and understand how to progress herself    Baseline  still learning    Time  8    Period  Weeks    Status  On-going      PT LONG TERM GOAL #2   Title  pain with penetration improved >/= 75% due to improved tissue mobility    Time  12    Period  Weeks    Status  Achieved      PT LONG TERM GOAL #3   Title  improved abdominal scar mobilty so abdominal pain decreased >/= 75%     Time  8    Period  Weeks    Status  On-going      PT LONG TERM GOAL #4   Title  pain with daily tasks decreased >/= 75% due to improved core and hip strength    Baseline  flare-up this week    Time  8    Period  Weeks    Status  On-going      PT LONG TERM GOAL #5   Title  pain with vaginal exam decreased >/= 75% due to ability to relax the pelvic floor tissues and bulge    Time  12    Period  Weeks    Status  Achieved      PT LONG TERM GOAL #6   Title  bowel movements decreased >/= 5 per day due to improve tissue mobility and reduction in scar tightness    Baseline  bowel movements 3-4 per day    Time  12    Period  Weeks    Status  Achieved      PT LONG TERM GOAL #7   Title  urinary leakage improved >/= 75% with activity    Time  8    Period  Weeks    Status  On-going  PT LONG TERM GOAL #8   Title  walking pain decreased >/= 75% due to increased strength     Time  8    Period  Weeks    Status  On-going            Plan - 10/15/18 1540    Clinical Impression Statement  Patient continues to have increased pain in the right lower abdomen and has increased swelling. Patient will have laproscopic surgery on Monday. Patient will return to therapy after the doctor gives her the okay to return. patient understands how to use the home tens unit. Patient came to therapy without crying. Patient has not met goals due to the pain. Patient deferred internal soft tissue work but therapist explained to patient we will have to do it after  surgery. Patient will be reassessed after surgery.     Rehab Potential  Good    Clinical Impairments Affecting Rehab Potential  extensive intra-abdominal adhesive disease; history of recurrent ovarian cysts; IBS; constipation; mood disorder; myofascial abdominal/pelvic dysfunction    PT Frequency  1x / week    PT Duration  8 weeks    PT Treatment/Interventions  Biofeedback;Cryotherapy;Presenter, broadcasting;Therapeutic exercise;Therapeutic activities;Neuromuscular re-education;Patient/family education;Manual techniques;Passive range of motion;Dry needling    PT Next Visit Plan  reassess patient after surgery and MD gives okay for therapy    PT Home Exercise Plan  Access Code: [email or web address]    Consulted and Agree with Plan of Care  Patient       Patient will benefit from skilled therapeutic intervention in order to improve the following deficits and impairments:  Pain, Increased fascial restricitons, Decreased coordination, Decreased mobility, Decreased scar mobility, Increased muscle spasms, Impaired tone, Decreased activity tolerance, Decreased endurance, Decreased range of motion, Decreased strength, Decreased balance  Visit Diagnosis: Muscle weakness (generalized)  Other lack of coordination  Cramp and spasm  Abnormal posture     Problem List Patient Active Problem List   Diagnosis Date Noted  . Paresthesia 07/08/2018  . Chronic migraine 07/08/2018    Earlie Counts, PT 10/15/18 4:17 PM   [facility] [address] [place], [place], [address] Phone: [phone]   Fax:  [phone]  Name: Charlene Ferguson MRN: [id number] Date of Birth: 02-15-1986

## 2018-10-16 ENCOUNTER — Other Ambulatory Visit: Payer: Self-pay | Admitting: Neurology

## 2018-10-23 ENCOUNTER — Ambulatory Visit (INDEPENDENT_AMBULATORY_CARE_PROVIDER_SITE_OTHER): Payer: 59 | Admitting: Neurology

## 2018-10-23 ENCOUNTER — Encounter: Payer: Self-pay | Admitting: Neurology

## 2018-10-23 VITALS — BP 115/85 | HR 98 | Ht 59.0 in | Wt 115.0 lb

## 2018-10-23 DIAGNOSIS — G43709 Chronic migraine without aura, not intractable, without status migrainosus: Secondary | ICD-10-CM

## 2018-10-23 DIAGNOSIS — IMO0002 Reserved for concepts with insufficient information to code with codable children: Secondary | ICD-10-CM

## 2018-10-23 NOTE — Progress Notes (Signed)
PATIENT: Charlene Ferguson DOB: 12/08/1985  REASON FOR VISIT: follow up Charlene FROM: patient  Charlene OF PRESENT ILLNESS: Today 10/23/18  Overall she reports her headaches are better. She is taking Nortriptyline 20 mg at bedtime. Weather change and stress are triggers. She just had a surgery to have her right fallopian tube removed and is still recovering.  In the last month she is having once a week migraines. She is taking Imitrex once a week. Within an 1 hour or so the headache is gone. The headache is behind her eyes, and on the right and left side of her head. 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 She is so thankful that her headaches are no longer daiyl. She reports feeling tired all the time, despite sleeping well at night. She has Charlene of ADHD, she will be seeing a psychiatrist in a few weeks to discuss stimulant medication. She reports that he PCP has checked her TSH recently and it was normal. She presents today for follow-up.   Charlene Ferguson is a 33 years old female, seen in request by her primary care physician Dr. Brunetta Genera, [name], for evaluation of chronic migraine, initial evaluation was on July 08, 2018.  I have reviewed and summarized the referring note from the referring physician.  She had a past medical Charlene of kidney stone, cardiac murmur.  She reported a Charlene of chronic migraine in her 35s, gradually getting worse, now she is having migraine headache every day, bilateral retro-orbital area severe pounding headache light noise sensitivity, blurry vision, she also complains of bilateral fingertips, feet paresthesia, whole body achy pain, was given the diagnosis of fibromyalgia, she works as a Freight forwarder, it is hard for her to work through her job, complains of memory loss, even difficulty writing her own name, I personally reviewed CT head without contrast in July 2017 that was normal Right laboratory evaluations in July 2019 showed  normal CBC hemoglobin of 12.8, normal CMP, creatinine of 0.66, INR of 1.02,  REVIEW OF SYSTEMS: Out of a complete 14 system review of symptoms, the patient complains only of the following symptoms, and all other reviewed systems are negative.  Appetite change   ALLERGIES: Allergies  Allergen Reactions  . Ceftriaxone Hives  . Morphine And Related Nausea And Vomiting and Rash    HOME MEDICATIONS: Outpatient Medications Prior to Visit  Medication Sig Dispense Refill  . albuterol (PROVENTIL HFA;VENTOLIN HFA) 108 (90 Base) MCG/ACT inhaler Inhale 2 puffs into the lungs every 6 (six) hours as needed for wheezing or shortness of breath.    . butalbital-acetaminophen-caffeine (FIORICET, ESGIC) 50-325-40 MG tablet Take 1 tablet by mouth 2 (two) times daily as needed for headache.    . Cholecalciferol (D3 MAXIMUM STRENGTH) 5000 units capsule Take 5,000 Units by mouth once a week.    . desogestrel-ethinyl estradiol (APRI,EMOQUETTE,SOLIA) 0.15-30 MG-MCG tablet Take one active pill daily, skip placebo pills, for menses suppression    . DULoxetine (CYMBALTA) 60 MG capsule Take 90 mg by mouth daily.     Marland Kitchen gabapentin (NEURONTIN) 300 MG capsule Take 300 mg by mouth 3 (three) times daily.    . hydrOXYzine (ATARAX/VISTARIL) 10 MG tablet Take 10 mg by mouth 3 (three) times daily as needed.    . lubiprostone (AMITIZA) 24 MCG capsule Take 24 mcg by mouth 2 (two) times daily with a meal.    . norethindrone-ethinyl estradiol (CYCLAFEM,ALYACEN) 0.5/0.75/1-35 MG-MCG tablet Take 1 tablet by mouth  daily.    . nortriptyline (PAMELOR) 10 MG capsule TAKE 2 CAPSULES (20 MG TOTAL) BY MOUTH AT BEDTIME. 180 capsule 4  . pantoprazole (PROTONIX) 40 MG tablet Take 40 mg by mouth daily.    . SUMAtriptan (IMITREX) 100 MG tablet Take 100 mg by mouth every 2 (two) hours as needed for migraine. May repeat in 2 hours if headache persists or recurs.    . triamcinolone (NASACORT ALLERGY 24HR) 55 MCG/ACT AERO nasal inhaler Place 2  sprays into the nose daily.    Marland Kitchen UNABLE TO FIND Take 1 Dose by mouth daily. HEMP     No facility-administered medications prior to visit.     PAST MEDICAL Charlene: Past Medical Charlene:  Diagnosis Date  . Anxiety   . Depression   . Fibromyalgia   . Heart murmur   . IBS (irritable bowel syndrome)   . Kidney stones   . Migraine   . Motion sickness    car back seat  . Neuropathy   . Ovarian cyst 2010   bilateral side. R cyst has been removed 2016  . SBO (small bowel obstruction) ([place])    2010  . 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 ABDOMINAL SURGERY    . NASAL SEPTOPLASTY W/ TURBINOPLASTY Bilateral 11/21/2017   Procedure: NASAL SEPTOPLASTY WITH TURBINATE REDUCTION;  Surgeon: Margaretha Sheffield, MD;  Location: [place];  Service: ENT;  Laterality: Bilateral;  . OVARIAN CYST SURGERY    . SMALL INTESTINE SURGERY     at birth to remove volvulus    FAMILY Charlene: Family Charlene  Problem Relation Age of Onset  . Prostate cancer Neg Hx   . Kidney disease Neg Hx   . Kidney cancer Neg Hx     SOCIAL Charlene: Social Charlene   Socioeconomic Charlene  . Marital status: Single    Spouse name: Not on file  . Number of children: 1  . Years of education: BA  . Highest education level: Not on file  Occupational Charlene  . 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 Financial resource strain: Not on file  . Food insecurity:    Worry: Not on file    Inability: Not on file  . Transportation needs:    Medical: Not on file    Non-medical: Not on file  Tobacco Use  . Smoking status: Never Smoker  . Smokeless tobacco: Never Used  Substance and Sexual Activity  . Alcohol use: No  . Drug use: No  . Sexual activity: Yes    Birth control/protection: Pill  Lifestyle  . Physical activity:    Days per week: Not on file    Minutes per  session: Not on file  . Stress: Not on file  Relationships  . Social connections:    Talks on phone: Not on file    Gets together: Not on file    Attends religious service: Not on file    Active member of club or organization: Not on file    Attends meetings of clubs or organizations: Not on file    Relationship status: Not on file  . Intimate partner violence:    Fear of current or ex partner: Not on file    Emotionally abused: Not on file    Physically abused: Not on file    Forced sexual  activity: Not on file  Other Topics Concern  . Not on file  Social Charlene Narrative   Lives with child   Caffeine use: daily      PHYSICAL EXAM  Vitals:   10/23/18 1046  BP: 115/85  Pulse: 98  Weight: 115 lb (52.2 kg)  Height: 4' 11"  (1.499 m)   Body mass index is 23.23 kg/m.  Generalized: Well developed, in no acute distress. She is recovering from having her right fallopian tube removed 3 days ago and still feels weak from such.   Neurological examination  Mentation: Alert oriented to time, place, Charlene taking. Follows all commands speech and language fluent Cranial nerve II-XII: Pupils were equal round reactive to light. Extraocular movements were full, visual field were full on confrontational test. Facial sensation and strength were normal. Uvula tongue midline. Head turning and shoulder shrug  were normal and symmetric. Motor: The motor testing reveals 4 over 5 strength of all 4 extremities. Good symmetric motor tone is noted throughout.  Sensory: Sensory testing is diminished to soft touch on her right arm and leg. No evidence of extinction is noted.  Coordination: Cerebellar testing reveals good finger-nose-finger and heel-to-shin bilaterally.  Gait and station: Gait is normal. Tandem gait is normal. Romberg is negative. No drift is seen.  Reflexes: Deep tendon reflexes are brisk on her left arm and leg.    DIAGNOSTIC DATA (LABS, IMAGING, TESTING) - I reviewed patient  records, labs, notes, testing and imaging myself where available.  Lab Results  Component Value Date   WBC 6.6 07/22/2018   HGB 14.3 07/22/2018   HCT 42.0 07/22/2018   MCV 88.3 07/22/2018   PLT 314 07/22/2018      Component Value Date/Time   NA 140 07/22/2018 0955   K 3.4 (L) 07/22/2018 0955   CL 101 07/22/2018 0955   CO2 27 04/02/2016 1721   GLUCOSE 82 07/22/2018 0955   BUN 4 (L) 07/22/2018 0955   CREATININE 0.60 07/22/2018 0955   CALCIUM 9.4 04/02/2016 1721   PROT 7.4 04/02/2016 1721   ALBUMIN 3.9 04/02/2016 1721   AST 19 04/02/2016 1721   ALT 12 (L) 04/02/2016 1721   ALKPHOS 37 (L) 04/02/2016 1721   BILITOT 0.6 04/02/2016 1721   GFRNONAA >60 04/02/2016 1721   GFRAA >60 04/02/2016 1721   No results found for: CHOL, HDL, LDLCALC, LDLDIRECT, TRIG, CHOLHDL No results found for: HGBA1C Lab Results  Component Value Date   VITAMINB12 298 07/08/2018   No results found for: TSH   MRI Nov 2019 IMPRESSION: This MRI of the brain without contrast shows the following: 1.    Couple small T2/FLAIR hyperintense foci in the hemispheres.  This is a nonspecific finding and most likely represents sequela of migraine or minimal chronic microvascular ischemic changes.  Demyelination would be less likely. 2.    There are no acute findings.   ASSESSMENT AND PLAN 33 y.o. year old female   1. Chronic Migraine, also bilateral upper and lower extremity paresthesia   She is still having 4 headaches a month. She can increase her nortriptyline to a max of 50 mg at bedtime. She will start with 30 mg at bedtime and increase slowly. She will continue to use Imitrex as needed for acute relief, not to exceed 2 tablets in 24 hours. MRI in 2019 showed no significant abnormality. Laboratory evaluation in October 2019 showed only mildly elevated CRP 28, with normal ESR. She is anticipating starting on some stimulant for her fatigue  and ADHD. She reports being treated for fibromyalgia. She will follow-up  in 6 months or sooner if needed.    I spent 15 minutes with the patient. 50% of this time was spent discussing her plan of care.  I saw patient with NP Judson Roch, agree above. Butler Denmark, AGNP-C, DNP 10/23/2018, 11:18 AM [facility] [address] [place], [place] [address] [phone]

## 2018-10-27 ENCOUNTER — Ambulatory Visit: Payer: Managed Care, Other (non HMO) | Attending: Obstetrics & Gynecology | Admitting: Physical Therapy

## 2018-10-27 ENCOUNTER — Telehealth: Payer: Self-pay | Admitting: Physical Therapy

## 2018-10-27 DIAGNOSIS — M6281 Muscle weakness (generalized): Secondary | ICD-10-CM | POA: Insufficient documentation

## 2018-10-27 DIAGNOSIS — R252 Cramp and spasm: Secondary | ICD-10-CM | POA: Insufficient documentation

## 2018-10-27 DIAGNOSIS — R278 Other lack of coordination: Secondary | ICD-10-CM | POA: Insufficient documentation

## 2018-10-27 NOTE — Telephone Encounter (Signed)
Called patient due to her no-showing for her appointment on 10/27/2018 at 10:15. Patient had the wrong day for her appointment and thought it was tomorrow.  Eulis Foster, PT @2 /11/2018@ 10:41 AM

## 2018-11-03 ENCOUNTER — Encounter: Payer: Self-pay | Admitting: Physical Therapy

## 2018-11-03 ENCOUNTER — Ambulatory Visit: Payer: Managed Care, Other (non HMO) | Admitting: Physical Therapy

## 2018-11-03 DIAGNOSIS — R252 Cramp and spasm: Secondary | ICD-10-CM | POA: Diagnosis present

## 2018-11-03 DIAGNOSIS — M6281 Muscle weakness (generalized): Secondary | ICD-10-CM

## 2018-11-03 DIAGNOSIS — R278 Other lack of coordination: Secondary | ICD-10-CM | POA: Diagnosis present

## 2018-11-03 NOTE — Therapy (Signed)
[facility] [address] [place], [place], [address] Phone: [phone]   Fax:  [phone]  Physical Therapy Treatment  Patient Details  Name: Charlene Ferguson MRN: [id number] Date of Birth: 1985/10/14 Referring Provider (PT): Dr. Daleen Squibb Abu-Aldnadi   Encounter Date: 11/03/2018  PT End of Session - 11/03/18 1015    Visit Number  [id number]    Date for PT Re-Evaluation  12/03/18    Authorization Type  [employer]    PT Start Time  1015    PT Stop Time  1055    PT Time Calculation (min)  40 min    Activity Tolerance  Patient tolerated treatment well;No increased pain    Behavior During Therapy  WFL for tasks assessed/performed       Past Medical History:  Diagnosis Date  . Anxiety   . Depression   . Fibromyalgia   . Heart murmur   . IBS (irritable bowel syndrome)   . Kidney stones   . Migraine   . Motion sickness    car back seat  . Neuropathy   . Ovarian cyst 2010   bilateral side. R cyst has been removed 2016  . SBO (small bowel obstruction) ([facility])    2010  . 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 ABDOMINAL SURGERY    . NASAL SEPTOPLASTY W/ TURBINOPLASTY Bilateral 11/21/2017   Procedure: NASAL SEPTOPLASTY WITH TURBINATE REDUCTION;  Surgeon: Vernie Murders, MD;  Location: [facility];  Service: ENT;  Laterality: Bilateral;  . OVARIAN CYST SURGERY    . SMALL INTESTINE SURGERY     at birth to remove volvulus    There were no vitals filed for this visit.  Subjective Assessment - 11/03/18 1016    Subjective  My belly button is infected. My belly button feel like it is on fire. I am not running a fever. My stomach is swollen. I had my surgery on 10/20/2018.     Pertinent History  Hx of volvulus with 4 in of small intestines removed at birth, R ovarian cyst removed, small bowel obstruction.     Patient  Stated Goals  stop pain and decrease constipation    Currently in Pain?  Yes    Pain Score  5     Pain Location  Abdomen   pelvic floor   Pain Orientation  Mid    Pain Descriptors / Indicators  Burning;Aching   pulling   Pain Type  Acute pain    Pain Onset  1 to 4 weeks ago    Pain Frequency  Constant    Aggravating Factors   due infected umbilicus, end of urination    Pain Relieving Factors  antibiotic    Multiple Pain Sites  No         [facility] PT Assessment - 11/03/18 0001      Assessment   Medical Diagnosis  [id number] Myofascial pain dysfunction syndrome    Referring Provider (PT)  Dr. Daleen Squibb Abu-Aldnadi    Prior Therapy  Yes for one session      Precautions   Precautions  None      Restrictions   Weight Bearing Restrictions  No      Home Environment   Living Environment  Private residence      Prior Function   Level of Independence  Independent  Vocation  Full time employment    Vocation Requirements  standing, lifting, walking, bending, jumping    Leisure  839 year old daughter      Cognition   Overall Cognitive Status  Within Functional Limits for tasks assessed      Observation/Other Assessments   Skin Integrity  redness in the umbilicus with some pus coming out                Pelvic Floor Special Questions - 11/03/18 0001    Pelvic Floor Internal Exam  Patient confirmed identification and approves treatment of the muscles    Exam Type  Vaginal    Strength  weak squeeze, no lift        [facility] Adult PT Treatment/Exercise - 11/03/18 0001      Manual Therapy   Manual Therapy  Myofascial release;Internal Pelvic Floor    Myofascial Release  release of the fascia in the abdomen on the right and lower    Internal Pelvic Floor  gentle soft tissue work to the introitus, sides of urethra, urethra sphincter and along th erim of the pubic rami with monitoring pain               PT Short Term Goals - 09/09/18 1615      PT SHORT TERM GOAL #1    Title  Pt will show IND with HEP    Time  4    Period  Weeks    Status  Achieved      PT SHORT TERM GOAL #2   Title  Pt will demo decreased tenderness with palpation over areas of the abdomina scar in order to promote motility and  bowel movements    Time  4    Period  Weeks    Status  Achieved      PT SHORT TERM GOAL #3   Title  Pt will demo increased diaphragmatic excursion and pelvic floor lengthening in order to promote improved toileting mechanics     Time  4    Period  Weeks    Status  Achieved      PT SHORT TERM GOAL #4   Title  understand how to toilet correctly to reduce straing and fully empty her bowels    Time  4    Period  Weeks    Status  Achieved        PT Long Term Goals - 11/03/18 1101      PT LONG TERM GOAL #1   Title  independent wtih HEP and understand how to progress herself    Baseline  still learning    Time  8    Period  Weeks    Status  On-going      PT LONG TERM GOAL #2   Title  pain with penetration improved >/= 75% due to improved tissue mobility    Time  12    Period  Weeks    Status  Achieved      PT LONG TERM GOAL #3   Title  improved abdominal scar mobilty so abdominal pain decreased >/= 75%     Period  Weeks    Status  On-going      PT LONG TERM GOAL #4   Title  pain with daily tasks decreased >/= 75% due to improved core and hip strength    Baseline  just had surgery on 10/20/2018    Time  8    Period  Weeks  Status  On-going      PT LONG TERM GOAL #5   Title  pain with vaginal exam decreased >/= 75% due to ability to relax the pelvic floor tissues and bulge    Time  12    Period  Weeks    Status  Achieved      PT LONG TERM GOAL #7   Title  urinary leakage improved >/= 75% with activity    Baseline  strength is 2/5    Time  8    Period  Weeks    Status  On-going      PT LONG TERM GOAL #8   Title  walking pain decreased >/= 75% due to increased strength     Baseline  just had surgery    Time  8    Period  Weeks     Status  On-going            Plan - 11/03/18 1055    Clinical Impression Statement  Patient had laproscopic surgery on 10/20/2018 to remove the right fallopian tube. Patient went to urgent care this weekend due to infected umbilicus. She will go to Dr. Dixon Boos office to see her collegue. Patient pain level after therapy was 1/10. Patient had more tightness on the left pelvic floor than the right. Patient pelvic floor strength is 2/5. Patient umbilicus was slightly red with some pus coming out. Patient will benefit from skilled therapy working on trigger points, gentle pelvic floor strength and reduce pain with daily tasks.     Rehab Potential  Good    Clinical Impairments Affecting Rehab Potential  extensive intra-abdominal adhesive disease; history of recurrent ovarian cysts; IBS; constipation; mood disorder; myofascial abdominal/pelvic dysfunction    PT Frequency  1x / week    PT Duration  8 weeks    PT Treatment/Interventions  Biofeedback;Cryotherapy;Physicist, medical;Therapeutic exercise;Therapeutic activities;Neuromuscular re-education;Patient/family education;Manual techniques;Passive range of motion;Dry needling    PT Next Visit Plan  continues with soft tissue work, add stretches, see what MD says    PT Home Exercise Plan  Access Code: [email or web address]    Consulted and Agree with Plan of Care  Patient       Patient will benefit from skilled therapeutic intervention in order to improve the following deficits and impairments:  Pain, Increased fascial restricitons, Decreased coordination, Decreased mobility, Decreased scar mobility, Increased muscle spasms, Impaired tone, Decreased activity tolerance, Decreased endurance, Decreased range of motion, Decreased strength, Decreased balance  Visit Diagnosis: Muscle weakness (generalized)  Other lack of coordination  Cramp and spasm     Problem List Patient Active Problem List   Diagnosis Date  Noted  . Paresthesia 07/08/2018  . Chronic migraine 07/08/2018    Eulis Foster, PT 11/03/18 11:03 AM   [facility] [address], [address] [place], [place], [address] Phone: [phone]   Fax:  [phone]  Name: Charlene Ferguson MRN: [id number] Date of Birth: 03-31-1986

## 2018-11-10 ENCOUNTER — Ambulatory Visit: Payer: Managed Care, Other (non HMO) | Admitting: Physical Therapy

## 2018-11-10 ENCOUNTER — Encounter: Payer: Self-pay | Admitting: Physical Therapy

## 2018-11-10 DIAGNOSIS — M6281 Muscle weakness (generalized): Secondary | ICD-10-CM

## 2018-11-10 DIAGNOSIS — R252 Cramp and spasm: Secondary | ICD-10-CM

## 2018-11-10 DIAGNOSIS — R278 Other lack of coordination: Secondary | ICD-10-CM

## 2018-11-10 NOTE — Patient Instructions (Signed)
Access Code: [email or web address]  URL: [email or web address]  Date: 11/10/2018  Prepared by: Eulis Foster   Exercises  Seated Piriformis Stretch with Trunk Bend - 12 reps - 1 sets - 30 hold - 1x daily - 7x weekly  Seated Hamstring Stretch - 2 reps - 1 sets - 30 hold - 1x daily - 7x weekly  V Sit Hip Adductor Hamstring Stretch - 2 reps - 1 sets - 1 min hold - 1x daily - 7x weekly  Cat Cow - 10 reps - 1 sets - 1x daily - 7x weekly  Child's Pose with Sidebending - 1 reps - 1 sets - 30 hold - 1x daily - 7x weekly  Supine Diaphragmatic Breathing - 10 reps - 1 sets - 5 hold - 1x daily - 7x weekly  Supine Chin Tuck - 5 reps - 1 sets - 5 hold - 1x daily - 7x weekly  Supine Scapular Retraction - 5 reps - 1 sets - 5 hold - 1x daily - 7x weekly  Supine Butterfly Groin Stretch - 1 reps - 1 sets - 1 in hold - 1x daily - 7x weekly  Hooklying Isometric Hip Flexion - 10 reps - 1 sets - 5 sec hold - 1x daily - 7x weekly  Marjo Bicker Pose - 1 reps - 1 sets - 30 sec hold - 1x daily - 7x weekly  [facility] [address], [address] [place], [place] [address] Phone # [phone] Fax [phone]

## 2018-11-10 NOTE — Therapy (Signed)
[facility] [address], [address] [place], [place], [address] Phone: [phone]   Fax:  [phone]  Physical Therapy Treatment  Patient Details  Name: Charlene Ferguson MRN: [id number] Date of Birth: [birth year] Referring Provider (PT): Dr. Daleen SquibbNoor Dasouki Abu-Aldnadi   Encounter Date: 11/10/2018  PT End of Session - 11/10/18 1130    Visit Number  18    Date for PT Re-Evaluation  12/03/18    Authorization Type  [employer]    PT Start Time  1015    PT Stop Time  1115    PT Time Calculation (min)  60 min    Activity Tolerance  Patient tolerated treatment well;No increased pain    Behavior During Therapy  WFL for tasks assessed/performed       Past Medical History:  Diagnosis Date  . Anxiety   . Depression   . Fibromyalgia   . Heart murmur   . IBS (irritable bowel syndrome)   . Kidney stones   . Migraine   . Motion sickness    car back seat  . Neuropathy   . Ovarian cyst 2010   bilateral side. R cyst has been removed 2016  . SBO (small bowel obstruction) (HCC)    2010  . 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 ABDOMINAL SURGERY    . NASAL SEPTOPLASTY W/ TURBINOPLASTY Bilateral 11/21/2017   Procedure: NASAL SEPTOPLASTY WITH TURBINATE REDUCTION;  Surgeon: Vernie MurdersJuengel, Paul, MD;  [facility];  Service: ENT;  Laterality: Bilateral;  . OVARIAN CYST SURGERY    . SMALL INTESTINE SURGERY     at birth to remove volvulus    There were no vitals filed for this visit.  Subjective Assessment - 11/10/18 1019    Subjective  I saw the doctor and they are happy with what I am doing with keeping the surgical site cleaned. I have been walking daily. No pain at end of urination.     Pertinent History  Hx of volvulus with 4 in of small intestines removed at birth, R ovarian cyst removed, small bowel obstruction.     Patient  Stated Goals  stop pain and decrease constipation    Currently in Pain?  Yes    Pain Score  3     Pain Location  Abdomen    Pain Orientation  Right    Pain Descriptors / Indicators  Aching    Pain Type  Acute pain    Pain Onset  1 to 4 weeks ago    Pain Frequency  Constant    Aggravating Factors   increased movement, eating too much    Pain Relieving Factors  urinating and bowel movement    Multiple Pain Sites  No                    Pelvic Floor Special Questions - 11/10/18 0001    Pelvic Floor Internal Exam  Patient confirmed identification and approves treatment of the muscles    Exam Type  Vaginal        [facility] Adult PT Treatment/Exercise - 11/10/18 0001      Neuro Re-ed    Neuro Re-ed Details   sit on red physioball with pelvic sway, pelvic circles, trunk twist, and rtunk rotation      Lumbar Exercises: Stretches   Quadruped Mid Back  Stretch  3 reps;30 seconds   all three directions   Piriformis Stretch  Left;Right;1 rep;30 seconds   sitting   Other Lumbar Stretch Exercise  sitting hip adductor stretch    Other Lumbar Stretch Exercise  supine butterfly stretch;       Lumbar Exercises: Supine   Basic Lumbar Stabilization  5 reps;1 second   pressing hands into mat but unable to contract abdominals   Isometric Hip Flexion  10 reps;5 seconds   each leg   Other Supine Lumbar Exercises  abdominal breathing to move the diaphragm and pelvic floor; scapular retraction holding 5 sec 10 times with tactile cues to contract correctly; supine chin tuck to stregthen the postural muscles;       Manual Therapy   Manual Therapy  Myofascial release;Internal Pelvic Floor    Myofascial Release  release of the fascia in the abdomen on the right and lower; release over the bladder area    Internal Pelvic Floor  gentle soft tissue work to the urethra sphincter, levator ani, obturator interist, and around the sphincter muscle             PT Education - 11/10/18 1045     Education provided  Yes    Education Details  Access Code: [email or web address]     Person(s) Educated  Patient    Methods  Explanation;Demonstration;Verbal cues;Handout    Comprehension  Returned demonstration;Verbalized understanding       PT Short Term Goals - 09/09/18 1615      PT SHORT TERM GOAL #1   Title  Pt will show IND with HEP    Time  4    Period  Weeks    Status  Achieved      PT SHORT TERM GOAL #2   Title  Pt will demo decreased tenderness with palpation over areas of the abdomina scar in order to promote motility and  bowel movements    Time  4    Period  Weeks    Status  Achieved      PT SHORT TERM GOAL #3   Title  Pt will demo increased diaphragmatic excursion and pelvic floor lengthening in order to promote improved toileting mechanics     Time  4    Period  Weeks    Status  Achieved      PT SHORT TERM GOAL #4   Title  understand how to toilet correctly to reduce straing and fully empty her bowels    Time  4    Period  Weeks    Status  Achieved        PT Long Term Goals - 11/10/18 1135      PT LONG TERM GOAL #1   Title  independent wtih HEP and understand how to progress herself    Baseline  still learning    Time  8    Period  Weeks    Status  On-going      PT LONG TERM GOAL #2   Title  pain with penetration improved >/= 75% due to improved tissue mobility    Time  12    Period  Weeks    Status  Achieved      PT LONG TERM GOAL #3   Title  improved abdominal scar mobilty so abdominal pain decreased >/= 75%     Time  8    Period  Weeks    Status  On-going      PT LONG TERM GOAL #  4   Title  pain with daily tasks decreased >/= 75% due to improved core and hip strength    Baseline  just had surgery on 10/20/2018    Time  8    Period  Weeks    Status  On-going      PT LONG TERM GOAL #5   Title  pain with vaginal exam decreased >/= 75% due to ability to relax the pelvic floor tissues and bulge    Time  12    Period  Weeks    Status  Achieved       PT LONG TERM GOAL #6   Title  bowel movements decreased >/= 5 per day due to improve tissue mobility and reduction in scar tightness    Baseline  bowel movements 3-4 per day    Time  12    Period  Weeks    Status  Achieved      PT LONG TERM GOAL #7   Title  urinary leakage improved >/= 75% with activity    Baseline  strength is 2/5    Time  8    Period  Weeks    Status  On-going      PT LONG TERM GOAL #8   Title  walking pain decreased >/= 75% due to increased strength     Baseline  just had surgery    Time  8    Period  Weeks    Status  On-going            Plan - 11/10/18 1029    Clinical Impression Statement  Patient pain level after therapy was 0/10. Patient understand the stretches she is to be doing and how to contract the lower abdominals. Patient had decreased swelling of the abdomen after therapy. Patient surgical site on the umbilicus was dry and a scab was forming. Patient was able to tolerate the internal soft tissue work. Patient will benefit from skilled therapy to work on trigger points, gentle pelvic floor strength and reduce with daily tasks.     Rehab Potential  Good    Clinical Impairments Affecting Rehab Potential  extensive intra-abdominal adhesive disease; history of recurrent ovarian cysts; IBS; constipation; mood disorder; myofascial abdominal/pelvic dysfunction    PT Frequency  1x / week    PT Duration  8 weeks    PT Treatment/Interventions  Biofeedback;Cryotherapy;Physicist, medical;Therapeutic exercise;Therapeutic activities;Neuromuscular re-education;Patient/family education;Manual techniques;Passive range of motion;Dry needling    PT Next Visit Plan  continues with soft tissue work, , core stabilizaton; check on urinary leakage    PT Home Exercise Plan  Access Code: [email or web address]    Consulted and Agree with Plan of Care  Patient       Patient will benefit from skilled therapeutic intervention in order to  improve the following deficits and impairments:  Pain, Increased fascial restricitons, Decreased coordination, Decreased mobility, Decreased scar mobility, Increased muscle spasms, Impaired tone, Decreased activity tolerance, Decreased endurance, Decreased range of motion, Decreased strength, Decreased balance  Visit Diagnosis: Muscle weakness (generalized)  Other lack of coordination  Cramp and spasm     Problem List Patient Active Problem List   Diagnosis Date Noted  . Paresthesia 07/08/2018  . Chronic migraine 07/08/2018    Eulis Foster, PT 11/10/18 11:37 AM   [facility] [address] [place], [place], [address] Phone: [phone]   Fax:  [phone]  Name: Charlene Ferguson MRN: [id number] Date of Birth: April 05, 1986

## 2018-11-17 ENCOUNTER — Ambulatory Visit: Payer: Managed Care, Other (non HMO) | Admitting: Physical Therapy

## 2018-11-18 ENCOUNTER — Encounter: Payer: Self-pay | Admitting: Physical Therapy

## 2018-11-18 ENCOUNTER — Ambulatory Visit: Payer: Managed Care, Other (non HMO) | Admitting: Physical Therapy

## 2018-11-18 DIAGNOSIS — R252 Cramp and spasm: Secondary | ICD-10-CM

## 2018-11-18 DIAGNOSIS — R278 Other lack of coordination: Secondary | ICD-10-CM

## 2018-11-18 DIAGNOSIS — M6281 Muscle weakness (generalized): Secondary | ICD-10-CM | POA: Diagnosis not present

## 2018-11-18 NOTE — Patient Instructions (Addendum)
Quick Contraction: Gravity Eliminated (Hook-Lying)    Lie with hips and knees bent. Quickly squeeze then fully relax pelvic floor. Perform __1_ sets of _5__. Rest for __5_ seconds between sets. Do _3__ times a day.   Copyright  VHI. All rights reserved.  Slow Contraction: Gravity Eliminated (Hook-Lying)    Lie with hips and knees bent. Slowly squeeze pelvic floor for __5_ seconds. Rest for __5_ seconds. Repeat _5__ times. Do _3__ times a day.   Copyright  VHI. All rights reserved.  [facility] [address] [place], [place] [address] Phone # [phone] Fax [phone]

## 2018-11-18 NOTE — Therapy (Addendum)
[facility] [address] [place], [place], [address] Phone: [phone]   Fax:  [phone]  Physical Therapy Treatment  Patient Details  Name: Charlene Ferguson MRN: [id number] Date of Birth: June 02, 1986 Referring Provider (PT): Dr. Arbutus Ped Abu-Aldnadi   Encounter Date: 11/18/2018  PT End of Session - 11/18/18 1059    Visit Number  19    Date for PT Re-Evaluation  12/03/18    Authorization Type  [employer]    PT Start Time  1015    PT Stop Time  4665    PT Time Calculation (min)  43 min    Activity Tolerance  Patient tolerated treatment well;No increased pain    Behavior During Therapy  WFL for tasks assessed/performed       Past Medical History:  Diagnosis Date  . Anxiety   . Depression   . Fibromyalgia   . Heart murmur   . IBS (irritable bowel syndrome)   . Kidney stones   . Migraine   . Motion sickness    car back seat  . Neuropathy   . Ovarian cyst 2010   bilateral side. R cyst has been removed 2016  . SBO (small bowel obstruction) ([place])    2010  . 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 ABDOMINAL SURGERY    . NASAL SEPTOPLASTY W/ TURBINOPLASTY Bilateral 11/21/2017   Procedure: NASAL SEPTOPLASTY WITH TURBINATE REDUCTION;  Surgeon: Margaretha Sheffield, MD;  Location: [facility];  Service: ENT;  Laterality: Bilateral;  . OVARIAN CYST SURGERY    . SMALL INTESTINE SURGERY     at birth to remove volvulus    There were no vitals filed for this visit.  Subjective Assessment - 11/18/18 1018    Subjective  I have been having pain in the ovaries from period. I am having a mini period and I am taking birth control daily. When the weather  is affecting my neck and back.     Pertinent History  Hx of volvulus with 4 in of small intestines removed at birth, R ovarian cyst removed, small bowel obstruction.      Patient Stated Goals  stop pain and decrease constipation    Currently in Pain?  Yes    Pain Score  3     Pain Location  Abdomen    Pain Orientation  Right;Lower    Pain Type  Acute pain    Pain Onset  1 to 4 weeks ago    Pain Frequency  Intermittent    Aggravating Factors   increased movement     Pain Relieving Factors  rest    Multiple Pain Sites  No                    Pelvic Floor Special Questions - 11/18/18 0001    Pelvic Floor Internal Exam  Patient confirmed identification and approves treatment of the muscles    Exam Type  Vaginal    Strength  weak squeeze, no lift    Strength # of seconds  5        [facility] Adult PT Treatment/Exercise - 11/18/18 0001      Neuro Re-ed    Neuro Re-ed Details   pelvic floor contraction with tactile cues for quick flicks and hold 5 seconds in hookly  Manual Therapy   Manual Therapy  Internal Pelvic Floor;Myofascial release    Myofascial Release  lower abdomen while performing soft tissue work internally    Internal Pelvic Floor  bil. levator ani and obturator internist             PT Education - 11/18/18 1055    Education provided  Yes    Education Details  pelvic floor contraction in [employer]) Educated  Patient    Methods  Explanation;Demonstration;Verbal cues;Handout    Comprehension  Returned demonstration;Verbalized understanding       PT Short Term Goals - 09/09/18 1615      PT SHORT TERM GOAL #1   Title  Pt will show IND with HEP    Time  4    Period  Weeks    Status  Achieved      PT SHORT TERM GOAL #2   Title  Pt will demo decreased tenderness with palpation over areas of the abdomina scar in order to promote motility and  bowel movements    Time  4    Period  Weeks    Status  Achieved      PT SHORT TERM GOAL #3   Title  Pt will demo increased diaphragmatic excursion and pelvic floor lengthening in order to promote improved toileting mechanics     Time  4    Period  Weeks     Status  Achieved      PT SHORT TERM GOAL #4   Title  understand how to toilet correctly to reduce straing and fully empty her bowels    Time  4    Period  Weeks    Status  Achieved        PT Long Term Goals - 11/18/18 1021      PT LONG TERM GOAL #1   Title  independent wtih HEP and understand how to progress herself    Baseline  still learning    Time  8    Period  Weeks    Status  On-going      PT LONG TERM GOAL #2   Title  pain with penetration improved >/= 75% due to improved tissue mobility    Baseline  no pain    Time  12    Period  Weeks    Status  Achieved      PT LONG TERM GOAL #3   Title  improved abdominal scar mobilty so abdominal pain decreased >/= 75%     Time  8    Period  Weeks    Status  On-going      PT LONG TERM GOAL #4   Title  pain with daily tasks decreased >/= 75% due to improved core and hip strength    Baseline  pain decreased 50% better    Time  8    Period  Weeks    Status  On-going      PT LONG TERM GOAL #5   Title  pain with vaginal exam decreased >/= 75% due to ability to relax the pelvic floor tissues and bulge    Time  12    Period  Weeks    Status  Achieved      PT LONG TERM GOAL #6   Title  bowel movements decreased >/= 5 per day due to improve tissue mobility and reduction in scar tightness    Time  12    Period  Weeks  Status  Achieved      PT LONG TERM GOAL #7   Title  urinary leakage improved >/= 75% with activity    Baseline  strength is 2/5    Time  8    Period  Weeks    Status  On-going      PT LONG TERM GOAL #8   Title  walking pain decreased >/= 75% due to increased strength     Baseline  50%better    Time  8    Period  Weeks    Status  On-going            Plan - 11/18/18 1026    Clinical Impression Statement  Patient has urinary leakage with having the urge to urinate and walking to the bathroom. Patient had fascial releases to the pelvic floor with soft tissue work internally. Pelvic floor strength  is 2/5 holding for 5 sec. patient abdominal pain decreased 50% with walking and daily tasks. Patient reports her right lower abdominal pain is 3/10 and is having some bleeding vaginally. Patient is having no pain with intercourse. Patient will benefit from skilled therapy to work on trigger points, gentle pelvic floor strength and reduce with daily tasks.     Rehab Potential  Good    Clinical Impairments Affecting Rehab Potential  extensive intra-abdominal adhesive disease; history of recurrent ovarian cysts; IBS; constipation; mood disorder; myofascial abdominal/pelvic dysfunction    PT Frequency  1x / week    PT Duration  8 weeks    PT Treatment/Interventions  Biofeedback;Cryotherapy;Presenter, broadcasting;Therapeutic exercise;Therapeutic activities;Neuromuscular re-education;Patient/family education;Manual techniques;Passive range of motion;Dry needling    PT Next Visit Plan  continues with soft tissue work, , core stabilizaton; check on urinary leakage; see what MD says    PT Home Exercise Plan  Access Code: [email or web address]    Consulted and Agree with Plan of Care  Patient       Patient will benefit from skilled therapeutic intervention in order to improve the following deficits and impairments:  Pain, Increased fascial restricitons, Decreased coordination, Decreased mobility, Decreased scar mobility, Increased muscle spasms, Impaired tone, Decreased activity tolerance, Decreased endurance, Decreased range of motion, Decreased strength, Decreased balance  Visit Diagnosis: Muscle weakness (generalized)  Other lack of coordination  Cramp and spasm     Problem List Patient Active Problem List   Diagnosis Date Noted  . Paresthesia 07/08/2018  . Chronic migraine 07/08/2018    Earlie Counts, PT 11/18/18 11:03 AM   [facility] [address], [place], [place], [address] Phone: [phone]    Fax:  [phone]  Name: SANORA CUNANAN MRN: [id number] Date of Birth: Nov 27, 1985  PHYSICAL THERAPY DISCHARGE SUMMARY  Visits from Start of Care: 19  Current functional level related to goals / functional outcomes: See above. Patient no-showed for appointments on 3/2,3/9, 3/18.  Therapist has called patient without returned phone calls.    Remaining deficits: See above   Education / Equipment: HEP Plan: Patient agrees to discharge.  Patient goals were not met. Patient is being discharged due to not returning since the last visit.  Thank you for the referral. Earlie Counts, PT 12/10/18 4:41 PM  ?????

## 2018-11-24 ENCOUNTER — Telehealth: Payer: Self-pay | Admitting: Physical Therapy

## 2018-11-24 ENCOUNTER — Ambulatory Visit: Payer: Managed Care, Other (non HMO) | Attending: Obstetrics & Gynecology | Admitting: Physical Therapy

## 2018-11-24 NOTE — Telephone Encounter (Signed)
Called patient about her appointment she missed at 4:15. Left a message.  Eulis Foster, PT @3 /10/2018@ 4:37 PM

## 2018-12-01 ENCOUNTER — Ambulatory Visit: Payer: Managed Care, Other (non HMO) | Admitting: Physical Therapy

## 2018-12-10 ENCOUNTER — Ambulatory Visit: Payer: Managed Care, Other (non HMO) | Admitting: Physical Therapy

## 2018-12-15 ENCOUNTER — Encounter: Payer: 59 | Admitting: Physical Therapy

## 2018-12-24 ENCOUNTER — Encounter: Payer: 59 | Admitting: Physical Therapy

## 2019-04-21 NOTE — Progress Notes (Signed)
PATIENT: Charlene Ferguson DOB: 1985/10/29  REASON FOR VISIT: follow up HISTORY FROM: patient  HISTORY OF PRESENT ILLNESS: Today 04/22/19  HISTORY  Candida PeelingJuana H Lopezis a 33 years old female, seen in request byher primary care physician [name], [name],for evaluation of chronic migraine, initial evaluation was on July 08, 2018.  I have reviewed and summarized the referring note from the referring physician.She had a past medical history of kidney stone, cardiac murmur.  She reported a history of chronic migraine in her 6320s, gradually getting worse, now she is having migraine headache every day, bilateral retro-orbital area severe pounding headache light noise sensitivity, blurry vision, she also complains of bilateral fingertips, feet paresthesia, whole body achy pain, was given the diagnosis of fibromyalgia, she works as a Emergency planning/management officerpre-k teacher, it is hard for her to work through her job, complains of memory loss, even difficulty writing her own name, I personally reviewed CT head without contrast in July 2017 that was normal Right laboratory evaluations in July 2019 showed normal CBC hemoglobin of 12.8, normal CMP, creatinine of 0.66, INR of 1.02,  Update 10/23/2018 SS: Overall she reports her headaches are better. She is taking Nortriptyline 20 mg at bedtime. Weather change and stress are triggers. She just had a surgery to have her right fallopian tube removed and is still recovering.  In the last month she is having once a week migraines. She is taking Imitrex once a week. Within an 1 hour or so the headache is gone. The headache is behind her eyes, and on the right and left side of her head. She is a Runner, broadcasting/film/videoteacher and has not had to miss any work. She is so thankful that her headaches are no longer daiyl. She reports feeling tired all the time, despite sleeping well at night. She has history of ADHD, she will be seeing a psychiatrist in a few weeks to discuss stimulant medication. She reports  that he PCP has checked her TSH recently and it was normal. She presents today for follow-up.   Update April 22, 2019 SS: 33 year old with history of chronic migraine, she is no longer taking nortriptyline, she stopped the medication due to side effect of fatigue, felt it was making her headaches worse.  She is currently not on any preventative medication.  She says she is having on average 2-3 migraines a week.  She says weather, storms, increasing barometric pressure are triggers for her migraines.  She will take Imitrex for headache, it will take the headache away.  Her primary doctor is prescribing Fioricet.  She says she may take abortive treatment once a week.  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  She had a fallopian tube removed, is doing better.  She says with a decrease in stress level, her diffuse achy body pain has improved.  REVIEW OF SYSTEMS: Out of a complete 14 system review of symptoms, the patient complains only of the following symptoms, and all other reviewed systems are negative.  Headache  ALLERGIES: Allergies  Allergen Reactions   Ceftriaxone Hives   Morphine And Related Nausea And Vomiting and Rash    HOME MEDICATIONS: Outpatient Medications Prior to Visit  Medication Sig Dispense Refill   albuterol (PROVENTIL HFA;VENTOLIN HFA) 108 (90 Base) MCG/ACT inhaler Inhale 2 puffs into the lungs every 6 (six) hours as needed for wheezing or shortness of breath.     amphetamine-dextroamphetamine (ADDERALL XR) 10 MG 24 hr capsule Take 10 mg by mouth daily.  butalbital-acetaminophen-caffeine (FIORICET, ESGIC) 50-325-40 MG tablet Take 1 tablet by mouth 2 (two) times daily as needed for headache.     Cholecalciferol (D3 MAXIMUM STRENGTH) 5000 units capsule Take 5,000 Units by mouth once a week.     FLUoxetine (PROZAC) 10 MG tablet      lubiprostone (AMITIZA) 24 MCG capsule Take 24 mcg by mouth 2 (two) times daily with a meal.     montelukast  (SINGULAIR) 10 MG tablet Take 10 mg by mouth at bedtime.     norethindrone-ethinyl estradiol (CYCLAFEM,ALYACEN) 0.5/0.75/1-35 MG-MCG tablet Take 1 tablet by mouth daily.     OLANZapine (ZYPREXA) 5 MG tablet 2 tablets.     pantoprazole (PROTONIX) 40 MG tablet Take 40 mg by mouth daily.     rOPINIRole (REQUIP) 0.5 MG tablet Take 0.5 mg by mouth 3 (three) times daily.     triamcinolone (NASACORT ALLERGY 24HR) 55 MCG/ACT AERO nasal inhaler Place 2 sprays into the nose daily.     SUMAtriptan (IMITREX) 100 MG tablet Take 100 mg by mouth every 2 (two) hours as needed for migraine. May repeat in 2 hours if headache persists or recurs.     desogestrel-ethinyl estradiol (APRI,EMOQUETTE,SOLIA) 0.15-30 MG-MCG tablet Take one active pill daily, skip placebo pills, for menses suppression     DULoxetine (CYMBALTA) 60 MG capsule Take 90 mg by mouth daily.      gabapentin (NEURONTIN) 300 MG capsule Take 300 mg by mouth 3 (three) times daily.     hydrOXYzine (ATARAX/VISTARIL) 10 MG tablet Take 10 mg by mouth 3 (three) times daily as needed.     nortriptyline (PAMELOR) 10 MG capsule TAKE 2 CAPSULES (20 MG TOTAL) BY MOUTH AT BEDTIME. 180 capsule 4   UNABLE TO FIND Take 1 Dose by mouth daily. HEMP     No facility-administered medications prior to visit.     PAST MEDICAL HISTORY: Past Medical History:  Diagnosis Date   Anxiety    Depression    Fibromyalgia    Heart murmur    IBS (irritable bowel syndrome)    Kidney stones    Migraine    Motion sickness    car back seat   Neuropathy    Ovarian cyst 2010   bilateral side. 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     PAST SURGICAL HISTORY: Past Surgical History:  Procedure Laterality Date   ABDOMINAL SURGERY     NASAL SEPTOPLASTY W/ TURBINOPLASTY Bilateral 11/21/2017   Procedure: NASAL SEPTOPLASTY WITH  TURBINATE REDUCTION;  Surgeon: Margaretha Sheffield, MD;  Location: [facility];  Service: ENT;  Laterality: Bilateral;   OVARIAN CYST SURGERY     SMALL INTESTINE SURGERY     at birth to remove volvulus    FAMILY HISTORY: Family History  Problem Relation Age of Onset   Prostate cancer Neg Hx    Kidney disease Neg Hx    Kidney cancer Neg Hx     SOCIAL HISTORY: Social History   Socioeconomic History   Marital status: Single    Spouse name: Not on file   Number of children: 1   Years of education: BA   Highest education level: Not on file  Occupational History   Occupation: [employer] strain: Not on file   Food insecurity  Worry: Not on file    Inability: Not on file   Transportation needs    Medical: Not on file    Non-medical: Not on file  Tobacco Use   Smoking status: Never Smoker   Smokeless tobacco: Never Used  Substance and Sexual Activity   Alcohol use: No   Drug use: No   Sexual activity: Yes    Birth control/protection: Pill  Lifestyle   Physical activity    Days per week: Not on file    Minutes per session: Not on file   Stress: Not on file  Relationships   Social connections    Talks on phone: Not on file    Gets together: Not on file    Attends religious service: Not on file    Active member of club or organization: Not on file    Attends meetings of clubs or organizations: Not on file    Relationship status: Not on file   Intimate partner violence    Fear of current or ex partner: Not on file    Emotionally abused: Not on file    Physically abused: Not on file    Forced sexual activity: Not on file  Other Topics Concern   Not on file  Social History Narrative   Lives with child   Caffeine use: daily    PHYSICAL EXAM  Vitals:   04/22/19 1345  BP: 117/78  Pulse: 81  Temp: 98 F (36.7 C)  Weight: 125 lb 9.6 oz (57 kg)  Height: 4\' 11"  (1.499 m)   Body mass  index is 25.37 kg/m.  Generalized: Well developed, in no acute distress   Neurological examination  Mentation: Alert oriented to time, place, history taking. Follows all commands speech and language fluent Cranial nerve II-XII: Pupils were equal round reactive to light. Extraocular movements were full, visual field were full on confrontational test. Facial sensation and strength were normal. Head turning and shoulder shrug  were normal and symmetric. Motor: The motor testing reveals 5 over 5 strength of all 4 extremities. Good symmetric motor tone is noted throughout.  Sensory: Sensory testing is intact to soft touch on all 4 extremities. No evidence of extinction is noted.  Coordination: Cerebellar testing reveals good finger-nose-finger and heel-to-shin bilaterally.  Gait and station: Gait is normal. Tandem gait is normal. Romberg is negative. No drift is seen.  Reflexes: Deep tendon reflexes are symmetric and normal bilaterally.   DIAGNOSTIC DATA (LABS, IMAGING, TESTING) - I reviewed patient records, labs, notes, testing and imaging myself where available.  Lab Results  Component Value Date   WBC 6.6 07/22/2018   HGB 14.3 07/22/2018   HCT 42.0 07/22/2018   MCV 88.3 07/22/2018   PLT 314 07/22/2018      Component Value Date/Time   NA 140 07/22/2018 0955   K 3.4 (L) 07/22/2018 0955   CL 101 07/22/2018 0955   CO2 27 04/02/2016 1721   GLUCOSE 82 07/22/2018 0955   BUN 4 (L) 07/22/2018 0955   CREATININE 0.60 07/22/2018 0955   CALCIUM 9.4 04/02/2016 1721   PROT 7.4 04/02/2016 1721   ALBUMIN 3.9 04/02/2016 1721   AST 19 04/02/2016 1721   ALT 12 (L) 04/02/2016 1721   ALKPHOS 37 (L) 04/02/2016 1721   BILITOT 0.6 04/02/2016 1721   GFRNONAA >60 04/02/2016 1721   GFRAA >60 04/02/2016 1721   No results found for: CHOL, HDL, LDLCALC, LDLDIRECT, TRIG, CHOLHDL No results found for: [id number] Lab Results  Component Value Date  VITAMINB12 298 07/08/2018   No results found for:  TSH  ASSESSMENT AND PLAN 33 y.o. 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 here with:  1.  Migraine headache 2.  Diffuse body aching pain, paresthesia  -She stopped nortriptyline due to side effect -She will start Topamax, wants to try low-dose, she will start 25 mg at bedtime for 3 days, increase to 50 mg at bedtime, she will call for dose adjustment -She will continue Imitrex as needed, take at onset of headache -She should use Fioricet cautiously, due to risk of rebound headache, prescribed by her pcp -In the past CRP has been elevated, 28 (07/08/2018) for unknown reason, may consider recheck in future, sed rate was normal -She will follow-up in 4 months or sooner if needed   I spent 15 minutes with the patient. 50% of this time was spent discussing her plan of care.   Margie EgeSarah Cherolyn Behrle, AGNP-C, DNP 04/22/2019, 2:19 PM [facility] [address], [address] [place], [place] [id number] [phone]

## 2019-04-22 ENCOUNTER — Other Ambulatory Visit: Payer: Self-pay

## 2019-04-22 ENCOUNTER — Ambulatory Visit (INDEPENDENT_AMBULATORY_CARE_PROVIDER_SITE_OTHER): Payer: 59 | Admitting: Neurology

## 2019-04-22 ENCOUNTER — Encounter: Payer: Self-pay | Admitting: Neurology

## 2019-04-22 VITALS — BP 117/78 | HR 81 | Temp 98.0°F | Ht 59.0 in | Wt 125.6 lb

## 2019-04-22 DIAGNOSIS — IMO0002 Reserved for concepts with insufficient information to code with codable children: Secondary | ICD-10-CM

## 2019-04-22 DIAGNOSIS — G43709 Chronic migraine without aura, not intractable, without status migrainosus: Secondary | ICD-10-CM

## 2019-04-22 MED ORDER — SUMATRIPTAN SUCCINATE 100 MG PO TABS: 100.0000 mg | ORAL_TABLET | ORAL | 5 refills | Status: DC | PRN

## 2019-04-22 MED ORDER — TOPIRAMATE 25 MG PO TABS: ORAL_TABLET | ORAL | 3 refills | Status: DC

## 2019-04-22 NOTE — Progress Notes (Signed)
I have reviewed and agreed above plan. 

## 2019-04-22 NOTE — Patient Instructions (Signed)
Start taking Topamax 25 mg at bedtime x 3 days, then start 50 mg at bedtime for migraine prevention. You can continue to use Imitrex as needed. Be cautious with fiorcet, due to risk for overuse and rebound headache.   Topiramate tablets What is this medicine? TOPIRAMATE (toe PYRE a mate) is used to treat seizures in adults or children with epilepsy. It is also used for the prevention of migraine headaches. This medicine may be used for other purposes; ask your health care provider or pharmacist if you have questions. COMMON BRAND NAME(S): Topamax, Topiragen What should I tell my health care provider before I take this medicine? They need to know if you have any of these conditions:  bleeding disorders  cirrhosis of the liver or liver disease  diarrhea  glaucoma  kidney stones or kidney disease  low blood counts, like low white cell, platelet, or red cell counts  lung disease like asthma, obstructive pulmonary disease, emphysema  metabolic acidosis  on a ketogenic diet  schedule for surgery or a procedure  suicidal thoughts, plans, or attempt; a previous suicide attempt by you or a family member  an unusual or allergic reaction to topiramate, other medicines, foods, dyes, or preservatives  pregnant or trying to get pregnant  breast-feeding How should I use this medicine? Take this medicine by mouth with a glass of water. Follow the directions on the prescription label. Do not crush or chew. You may take this medicine with meals. Take your medicine at regular intervals. Do not take it more often than directed. Talk to your pediatrician regarding the use of this medicine in children. Special care may be needed. While this drug may be prescribed for children as young as 312 years of age for selected conditions, precautions do apply. Overdosage: If you think you have taken too much of this medicine contact a poison control center or emergency room at once. NOTE: This medicine is  only for you. Do not share this medicine with others. What if I miss a dose? If you miss a dose, take it as soon as you can. If your next dose is to be taken in less than 6 hours, then do not take the missed dose. Take the next dose at your regular time. Do not take double or extra doses. What may interact with this medicine? Do not take this medicine with any of the following medications:  probenecid This medicine may also interact with the following medications:  acetazolamide  alcohol  amitriptyline  aspirin and aspirin-like medicines  birth control pills  certain medicines for depression  certain medicines for seizures  certain medicines that treat or prevent blood clots like warfarin, enoxaparin, dalteparin, apixaban, dabigatran, and rivaroxaban  digoxin  hydrochlorothiazide  lithium  medicines for pain, sleep, or muscle relaxation  metformin  methazolamide  NSAIDS, medicines for pain and inflammation, like ibuprofen or naproxen  pioglitazone  risperidone This list may not describe all possible interactions. Give your health care provider a list of all the medicines, herbs, non-prescription drugs, or dietary supplements you use. Also tell them if you smoke, drink alcohol, or use illegal drugs. Some items may interact with your medicine. What should I watch for while using this medicine? Visit your doctor or health care professional for regular checks on your progress. Do not stop taking this medicine suddenly. This increases the risk of seizures if you are using this medicine to control epilepsy. Wear a medical identification bracelet or chain to say you have epilepsy  or seizures, and carry a card that lists all your medicines. This medicine can decrease sweating and increase your body temperature. Watch for signs of deceased sweating or fever, especially in children. Avoid extreme heat, hot baths, and saunas. Be careful about exercising, especially in hot weather.  Contact your health care provider right away if you notice a fever or decrease in sweating. You should drink plenty of fluids while taking this medicine. If you have had kidney stones in the past, this will help to reduce your chances of forming kidney stones. If you have stomach pain, with nausea or vomiting and yellowing of your eyes or skin, call your doctor immediately. You may get drowsy, dizzy, or have blurred vision. Do not drive, use machinery, or do anything that needs mental alertness until you know how this medicine affects you. To reduce dizziness, do not sit or stand up quickly, especially if you are an older patient. Alcohol can increase drowsiness and dizziness. Avoid alcoholic drinks. If you notice blurred vision, eye pain, or other eye problems, seek medical attention at once for an eye exam. The use of this medicine may increase the chance of suicidal thoughts or actions. Pay special attention to how you are responding while on this medicine. Any worsening of mood, or thoughts of suicide or dying should be reported to your health care professional right away. This medicine may increase the chance of developing metabolic acidosis. If left untreated, this can cause kidney stones, bone disease, or slowed growth in children. Symptoms include breathing fast, fatigue, loss of appetite, irregular heartbeat, or loss of consciousness. Call your doctor immediately if you experience any of these side effects. Also, tell your doctor about any surgery you plan on having while taking this medicine since this may increase your risk for metabolic acidosis. Birth control pills may not work properly while you are taking this medicine. Talk to your doctor about using an extra method of birth control. Women who become pregnant while using this medicine may enroll in the [place] American Antiepileptic Drug Pregnancy Registry by calling [phone]. This registry collects information about the safety of  antiepileptic drug use during pregnancy. What side effects may I notice from receiving this medicine? Side effects that you should report to your doctor or health care professional as soon as possible:  allergic reactions like skin rash, itching or hives, swelling of the face, lips, or tongue  decreased sweating and/or rise in body temperature  depression  difficulty breathing, fast or irregular breathing patterns  difficulty speaking  difficulty walking or controlling muscle movements  hearing impairment  redness, blistering, peeling or loosening of the skin, including inside the mouth  tingling, pain or numbness in the hands or feet  unusual bleeding or bruising  unusually weak or tired  worsening of mood, thoughts or actions of suicide or dying Side effects that usually do not require medical attention (report to your doctor or health care professional if they continue or are bothersome):  altered taste  back pain, joint or muscle aches and pains  diarrhea, or constipation  headache  loss of appetite  nausea  stomach upset, indigestion  tremors This list may not describe all possible side effects. Call your doctor for medical advice about side effects. You may report side effects to FDA at 1-800-FDA-1088. Where should I keep my medicine? Keep out of the reach of children. Store at room temperature between 15 and 30 degrees C (59 and 86 degrees F) in a tightly closed  container. Protect from moisture. Throw away any unused medicine after the expiration date. NOTE: This sheet is a summary. It may not cover all possible information. If you have questions about this medicine, talk to your doctor, pharmacist, or health care provider.  2020 Elsevier/Gold Standard (2013-09-14 23:17:57)

## 2019-04-25 ENCOUNTER — Encounter: Payer: Self-pay | Admitting: Neurology

## 2019-04-26 ENCOUNTER — Encounter: Payer: Self-pay | Admitting: Neurology

## 2019-05-01 ENCOUNTER — Encounter: Payer: Self-pay | Admitting: Neurology

## 2019-05-06 NOTE — Telephone Encounter (Signed)
05-18-19 is next available virtual appt (any appt).

## 2019-05-07 ENCOUNTER — Telehealth: Payer: Self-pay | Admitting: Neurology

## 2019-05-07 MED ORDER — RIZATRIPTAN BENZOATE 10 MG PO TABS: 10.0000 mg | ORAL_TABLET | ORAL | 3 refills | Status: DC | PRN

## 2019-05-07 NOTE — Telephone Encounter (Signed)
I called the patient. She says she starts good in the morning, but by the end of the day she had headache. She has started back to school and is making it worse. She is taking Topamax 100 mg at bedtime for headache. She will take Imitrex, but it isn't helpful. She has tried nortriptyline, it wasn't helped. She is off Prozac now too, recently stopped it. Is off Zyprexa.   1.  We will try to switch to Maxalt, stop Imitrex, to see if helpful 2.  She can take Advil, along with Maxalt, even Benadryl if needed, get some rest 3.  Continue Topamax 100 mg at bedtime, could dose twice daily if needed 4.  If no better, could do 3 day decadron taper, if no better she will call or my chart message 5.  Has a revisit coming up, can discuss starting CGRP, she has tried nortriptyline,  Didn't consider beta blocker because underling depression/anxiety  At last visit 04/22/2019 having 2-3 migraines a week, not taking anything, started Topamax, was doing well, but got into cycle of headache starting back to school.

## 2019-05-07 NOTE — Telephone Encounter (Signed)
Pt would like provider to call her to discuss medications. Please advise.

## 2019-05-13 ENCOUNTER — Telehealth: Payer: Self-pay | Admitting: Neurology

## 2019-05-13 MED ORDER — PROPRANOLOL HCL 20 MG PO TABS: 20.0000 mg | ORAL_TABLET | Freq: Two times a day (BID) | ORAL | 6 refills | Status: DC

## 2019-05-13 NOTE — Telephone Encounter (Signed)
I called the patient.  She currently has a lapse in her health insurance.  She reports continued migraine headaches.  She has not found the Topamax to be beneficial (underlying history of kidney stones).  We will taper off the medication.  She will start propanolol 20 mg twice a day.  She does have history of underlying depression, but reports is under good control at this time.  She will let me know if her depression worsens.  In the past she has tried nortriptyline.  She did not want to start Effexor. She will call if headaches are no better. She can continue Maxalt.  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  In the future, she may need a 3-day Decadron taper to break the headache cycle. She will call for dose adjustments. I think she would be a good candidate for CGRP in the future, when her insurance kicks back in.

## 2019-05-18 ENCOUNTER — Telehealth: Payer: Self-pay | Admitting: Neurology

## 2019-07-20 ENCOUNTER — Encounter: Payer: Self-pay | Admitting: Neurology

## 2019-08-02 ENCOUNTER — Encounter: Payer: Self-pay | Admitting: Neurology

## 2019-08-04 ENCOUNTER — Telehealth: Payer: Self-pay | Admitting: Neurology

## 2019-08-04 ENCOUNTER — Emergency Department (HOSPITAL_COMMUNITY)
Admission: EM | Admit: 2019-08-04 | Discharge: 2019-08-04 | Disposition: A | Discharge status: Home / Self Care | Payer: BC Managed Care – PPO | Attending: Emergency Medicine | Admitting: Emergency Medicine

## 2019-08-04 DIAGNOSIS — Z5321 Procedure and treatment not carried out due to patient leaving prior to being seen by health care provider: Secondary | ICD-10-CM | POA: Insufficient documentation

## 2019-08-04 DIAGNOSIS — G43909 Migraine, unspecified, not intractable, without status migrainosus: Secondary | ICD-10-CM | POA: Diagnosis present

## 2019-08-04 MED ORDER — TOPIRAMATE 100 MG PO TABS: ORAL_TABLET | ORAL | 1 refills | Status: DC

## 2019-08-04 MED ORDER — AIMOVIG 70 MG/ML ~~LOC~~ SOAJ: 70.0000 mg | SUBCUTANEOUS | 5 refills | Status: DC

## 2019-08-04 MED ORDER — DICLOFENAC POTASSIUM 50 MG PO TABS: 50.0000 mg | ORAL_TABLET | Freq: Two times a day (BID) | ORAL | 1 refills | Status: DC | PRN

## 2019-08-04 MED ORDER — PROPRANOLOL HCL 20 MG PO TABS: 40.0000 mg | ORAL_TABLET | Freq: Two times a day (BID) | ORAL | 6 refills | Status: DC

## 2019-08-04 NOTE — Telephone Encounter (Signed)
She went to the ER last night for migraine for the past 3 weeks. They are getting worse. She was given Decadron, Benadryl, Toradol, Reglan, it helped for 3 hours. She is taking Topamax 50 mg at bedtime, propranolol 20 mg twice daily. She can't identify any trigger for worsening headache. She is crying on the phone. Maxalt is not helping. She went to the ER today but the wait was too long. She says she has had daily headache for 3 years and is crying.   1. Increase Topamax 100 mg at bedtime 2. Increase propranolol 40 mg twice a day 3. Start Aimovig 70 mg monthly injection  4. Diclofenac potassium as needed for headache  5. Can come tomorrow morning for migraine infusion at 9 am, I can check on her then to ensure she has cocktail of medications for her headache. She is not pregnant, will have a driver.    Depacon 1000 mg IV, Toradol 30 mg IV, 10 mg IV compazine.

## 2019-08-04 NOTE — ED Notes (Signed)
Pt states she wants to LWBS

## 2019-08-04 NOTE — ED Triage Notes (Signed)
Pt presents w/migraine x3 weeks, reports she has been taking prescribed medication. Pt tearful in triage, poor report.

## 2019-08-04 NOTE — Telephone Encounter (Signed)
Pt called stating that she is wanting to speak to the provider about her migraines. Please advise.

## 2019-08-05 ENCOUNTER — Telehealth: Payer: Self-pay | Admitting: Neurology

## 2019-08-05 MED ORDER — TIZANIDINE HCL 4 MG PO TABS: 4.0000 mg | ORAL_TABLET | Freq: Four times a day (QID) | ORAL | 0 refills | Status: DC | PRN

## 2019-08-05 MED ORDER — ELETRIPTAN HYDROBROMIDE 40 MG PO TABS: 40.0000 mg | ORAL_TABLET | ORAL | 3 refills | Status: DC | PRN

## 2019-08-05 NOTE — Telephone Encounter (Signed)
I saw the patient in the infusion suite today.  I reviewed the chart, she has history of kidney stones, we will discontinue Topamax.  We made the following changes to her medications today...  1.  Continue propanolol 40 mg twice a day 2.  Start Aimovig 70 mg monthly injection (gave her a co-pay card so she should be able to get today) 3.  Did not get much benefit from Maxalt, or Imitrex, try Relpax 40 mg tablet 4.  For severe headache, may take cocktail of medication Relpax, tizanidine, Zofran, ibuprofen or diclofenac potassium 5. She has follow-up appointment in 2 weeks

## 2019-08-05 NOTE — Telephone Encounter (Signed)
Noted  

## 2019-08-10 ENCOUNTER — Telehealth: Payer: Self-pay | Admitting: Neurology

## 2019-08-10 NOTE — Telephone Encounter (Signed)
She confirmed she is taking both Aimovig and propranolol, as prescribed.  States she has been having daily headaches and intermittent migraines for four weeks.  She is agreeable to try the recommended home, oral cockail and verbalized understanding to take the following, together:  Relpax, Aleve, Zofran and Zanaflex.  She understands to lay down and rest after taking.  She was instructed to call back, if her migraine cycle continues.

## 2019-08-10 NOTE — Telephone Encounter (Signed)
Please verify the medication that she is taking as migraine prevention, and also abortive treatment,  She may consider Relpax together with Aleve, tizanidine, for her headaches, if she has a lot of nausea may add on Zofran as needed for her headaches.

## 2019-08-11 ENCOUNTER — Telehealth: Payer: Self-pay | Admitting: Neurology

## 2019-08-11 MED ORDER — SUMATRIPTAN SUCCINATE 6 MG/0.5ML ~~LOC~~ SOLN: 6.0000 mg | SUBCUTANEOUS | 11 refills | Status: DC | PRN

## 2019-08-11 NOTE — Telephone Encounter (Signed)
Pt called and wanted to discuss her migraines with the RN. Please advise.

## 2019-08-11 NOTE — Telephone Encounter (Addendum)
I called pt.  She is having daily headaches which turn into migraines for the last 4 wks.  Today level 5.  L eye pain, blurry vision.  Nausea.  Tried cocktail (relpax aleve, zofran, zanaflex and did not help.  I updated her medlist.  She has been see in ED several times and meds work for 2 hour then come back worse then before.  She has appt 08-17-19 my chart VV.  You have nothing sooner.  She states that she would like a MRI., last one being yr ago.  She stated that she has some spine pain too.  I relayed that SS/NP out this am, will forward.  Please advise.

## 2019-08-11 NOTE — Telephone Encounter (Signed)
Please call patient, I do not think that she needs to have a repeat MRI of brain,   I have called in Imitrex 6mg  SQ as needed for abortive treatment, she may combine it with Tizanidine, Aleve, even Benadryl at home  If still not getting better, may consider in office iv infusion

## 2019-08-11 NOTE — Telephone Encounter (Signed)
Pt has called back to inform she would like to move forward with the MRI.  Pt states she was told by RN Sharyn Lull to call and ask for [place], please call

## 2019-08-11 NOTE — Addendum Note (Signed)
Addended by: Brandon Melnick on: 08/11/2019 10:58 AM   Modules accepted: Orders

## 2019-08-11 NOTE — Addendum Note (Signed)
Addended by: Marcial Pacas on: 08/11/2019 11:43 AM   Modules accepted: Orders

## 2019-08-11 NOTE — Telephone Encounter (Signed)
I called pt and relayed that per Dr. Krista Blue she did not want her have MRI at this time.  She ordered imitrex inj for abortive therapy. Can take tizanidine, aleve, benadryl together to see if can make her migraine go away.  She verbalized understanding.

## 2019-08-12 ENCOUNTER — Telehealth: Payer: Self-pay | Admitting: Neurology

## 2019-08-12 NOTE — Telephone Encounter (Signed)
I talked with the ER physician.  She is better now for migraine, has received migraine cocktail.  He has the record of prior MRI, reviewed with the patient.  Does not feel MRI is needed at this time.  I will see her next Monday for follow-up, can discuss at that time.  It sounds as though she is doing better after cocktail, will be discharged.

## 2019-08-12 NOTE — Telephone Encounter (Signed)
I called Dr. Westley Foots at [facility] ED.  Pt there now for migraine treatment.  He had ? About pt MRI, I relayed what I told pt per Dr. Krista Blue.  I told him that I would have SS/NP call him at # provided when she was finished with her present pt. [phone].

## 2019-08-12 NOTE — Telephone Encounter (Signed)
Legrand Como with [facility] [place] [facility] called to speak with Ms.Slack in regards to the patients recurrent headaches. States he see's the patient has been in and out of the ED and would like to get some context as to whats going on with the patient.  Please follow up.

## 2019-08-16 NOTE — Progress Notes (Signed)
Virtual Visit via Video Note  I connected with Charlene Ferguson on 08/17/19 at  2:15 PM EST by a video enabled telemedicine application and verified that I am speaking with the correct person using two identifiers.  Location: Patient: At home Provider: In the office    I discussed the limitations of evaluation and management by telemedicine and the availability of in person appointments. The patient expressed understanding and agreed to proceed.  History of Present Illness: HISTORY  Charlene Wiens Lopezis a 33 years old female, seen in request byher primary care physician [name], [name],for evaluation of chronic migraine, initial evaluation was on July 08, 2018.  I have reviewed and summarized the referring note from the referring physician.She had a past medical history of kidney stone, cardiac murmur.  She reported a history of chronic migraine in her 57s, gradually getting worse, now she is having migraine headache every day, bilateral retro-orbital area severe pounding headache light noise sensitivity, blurry vision, she also complains of bilateral fingertips, feet paresthesia, whole body achy pain, was given the diagnosis of fibromyalgia, she works as a Emergency planning/management officer, it is hard for her to work through her job, complains of memory loss, even difficulty writing her own name, I personally reviewed CT head without contrast in July 2017 that was normal Right laboratory evaluations in July 2019 showed normal CBC hemoglobin of 12.8, normal CMP, creatinine of 0.66, INR of 1.02,  Update 10/23/2018 SS: Overall she reports her headaches are better.She is taking Nortriptyline 20 mg at bedtime. Weatherchange and stress are triggers.She just had a surgery to have her right fallopian tube removed and is still recovering.In the last month she is having once a week migraines. She is taking Imitrex once a week. Within an 1 hour or so the headache is gone. The headache is behind her  eyes,and on the right and left side of her head. 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 She isso thankful that her headaches are no longer daiyl. She reports feeling tired all the time, despite sleeping well at night. She has history of ADHD, she will be seeing a psychiatrist in a few weeks to discuss stimulant medication.She reports that he PCP has checked her TSH recently and it was normal. She presents today for follow-up.  Update April 22, 2019 SS: 33 year old with history of chronic migraine, she is no longer taking nortriptyline, she stopped the medication due to side effect of fatigue, felt it was making her headaches worse.  She is currently not on any preventative medication.  She says she is having on average 2-3 migraines a week.  She says weather, storms, increasing barometric pressure are triggers for her migraines.  She will take Imitrex for headache, it will take the headache away.  Her primary doctor is prescribing Fioricet.  She says she may take abortive treatment once a week.  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  She had a fallopian tube removed, is doing better.  She says with a decrease in stress level, her diffuse achy body pain has improved.  Update August 17, 2019 SS: Over the last several months she has had multiple telephone calls for worsening headache.  When last seen in July, she was doing fairly well.  She stopped nortriptyline due to side effects, she was started on Topamax, but this was discontinued due to history of kidney stones.  She was switched to Maxalt, as Imitrex was not completely beneficial.  She was in the ER 08/03/2019 for migraine headache, she was treated with Decadron, Benadryl, Toradol, and Reglan.  She was seen in our office 11/11 for migraine infusion, medications were adjusted propanolol 40 mg twice daily, start Aimovig, Switch to Relpax, tizanidine, diclofenac potassium as needed.  Imitrex 6 mg SQ was sent in, after  multiple telephone calls.  She was at the [facility] ER 11/18 for migraine infusion.  Today via virtual visit, continues to complain of chronic daily headache, also has chronic abdominal pain.  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  She is requesting an MRI of the brain, she is convinced she has abnormality.  She is under the care of a psychiatrist.  She complains of tingling in her lips and face, she says this is fairly new.  In the past she has tried gabapentin without benefit.  She said her primary doctor recently gave her Roselyn Meier to try, it didn't help.  She was unable to get Imitrex injections filled due to insurance saying she maxed out of her medications.  She has done 1 injection of Aimovig. She tells me historically, she has clusters of continuous migraines, that will let up after several months.   Observations/Objective: Via virtual visit, is alert and oriented, sitting bed, facial symmetry noted, follows commands, gait is cautious but intact  Assessment and Plan: 1.  Chronic migraine headache -Has fallen into the cycle of daily chronic migraines, multiple ER visits, telephone calls -Increase Aimovig 140 mg monthly injection -Continue propanolol 40 mg twice a day -Continue Relpax, diclofenac potassium, tizanidine, Zofran as needed for migraine headache -Tried Ubrelvy, topamax, Imitrex, Maxalt, nortriptyline, gabapentin without benefit, her insurance would not cover Imitrex injection -She is also taking Lamictal, Lyrica, and Cymbalta which may benefit headache -I will reorder MRI of the brain, she is concerned her headaches have worsened, very worrisome to her, no medications are helping -If MRI of the brain does not show abnormality, may consider initiating Botox therapy for chronic migraines, has only had 1 CGRP injection at this point  Follow Up Instructions: She has follow-up scheduled for December 2020   I discussed the assessment and treatment plan  with the patient. The patient was provided an opportunity to ask questions and all were answered. The patient agreed with the plan and demonstrated an understanding of the instructions.   The patient was advised to call back or seek an in-person evaluation if the symptoms worsen or if the condition fails to improve as anticipated.  I provided 15 minutes of non-face-to-face time during this encounter.  Evangeline Dakin, DNP  [facility] [address], [place], [place] [address] [phone]

## 2019-08-17 ENCOUNTER — Telehealth (INDEPENDENT_AMBULATORY_CARE_PROVIDER_SITE_OTHER): Payer: BC Managed Care – PPO | Admitting: Neurology

## 2019-08-17 ENCOUNTER — Encounter: Payer: Self-pay | Admitting: Neurology

## 2019-08-17 DIAGNOSIS — G8929 Other chronic pain: Secondary | ICD-10-CM

## 2019-08-17 DIAGNOSIS — G43709 Chronic migraine without aura, not intractable, without status migrainosus: Secondary | ICD-10-CM | POA: Diagnosis not present

## 2019-08-17 DIAGNOSIS — R519 Headache, unspecified: Secondary | ICD-10-CM

## 2019-08-17 DIAGNOSIS — IMO0002 Reserved for concepts with insufficient information to code with codable children: Secondary | ICD-10-CM

## 2019-08-17 MED ORDER — AIMOVIG 140 MG/ML ~~LOC~~ SOAJ: 140.0000 mg | SUBCUTANEOUS | 5 refills | Status: DC

## 2019-08-24 ENCOUNTER — Other Ambulatory Visit: Payer: 59

## 2019-08-25 ENCOUNTER — Other Ambulatory Visit: Payer: Self-pay

## 2019-08-25 ENCOUNTER — Other Ambulatory Visit: Payer: Self-pay | Admitting: Neurology

## 2019-08-25 ENCOUNTER — Ambulatory Visit: Payer: BC Managed Care – PPO | Attending: Gastroenterology | Admitting: Physical Therapy

## 2019-08-25 ENCOUNTER — Encounter: Payer: Self-pay | Admitting: Physical Therapy

## 2019-08-25 DIAGNOSIS — R293 Abnormal posture: Secondary | ICD-10-CM | POA: Insufficient documentation

## 2019-08-25 DIAGNOSIS — R252 Cramp and spasm: Secondary | ICD-10-CM | POA: Diagnosis present

## 2019-08-25 DIAGNOSIS — M6281 Muscle weakness (generalized): Secondary | ICD-10-CM

## 2019-08-25 DIAGNOSIS — R1084 Generalized abdominal pain: Secondary | ICD-10-CM | POA: Diagnosis present

## 2019-08-25 DIAGNOSIS — M545 Low back pain, unspecified: Secondary | ICD-10-CM

## 2019-08-25 NOTE — Therapy (Signed)
[facility] [address] [place], [place], [address] Phone: [phone]   Fax:  [phone]  Physical Therapy Evaluation  Patient Details  Name: Charlene Ferguson MRN: [id number] Date of Birth: December 04, 1985 Referring Provider (PT): Dr. Sabra Heck Ransohoff   Encounter Date: 08/25/2019  PT End of Session - 08/25/19 1440    Visit Number  1    Date for PT Re-Evaluation  10/20/19    Authorization Type  [employer]    PT Start Time  1400    PT Stop Time  1441    PT Time Calculation (min)  41 min    Activity Tolerance  Patient tolerated treatment well    Behavior During Therapy  [facility] for tasks assessed/performed       Past Medical History:  Diagnosis Date  . Anxiety   . Depression   . Fibromyalgia   . Heart murmur   . IBS (irritable bowel syndrome)   . Kidney stones   . Migraine   . Motion sickness    car back seat  . Neuropathy   . Ovarian cyst 2010   bilateral side. R cyst has been removed 2016  . SBO (small bowel obstruction) (HCC)    2010  . 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 ABDOMINAL SURGERY    . NASAL SEPTOPLASTY W/ TURBINOPLASTY Bilateral 11/21/2017   Procedure: NASAL SEPTOPLASTY WITH TURBINATE REDUCTION;  Surgeon: Vernie Murders, MD;  Location: [facility];  Service: ENT;  Laterality: Bilateral;  . OVARIAN CYST SURGERY    . SMALL INTESTINE SURGERY     at birth to remove volvulus    There were no vitals filed for this visit.   Subjective Assessment - 08/25/19 1404    Subjective  Patient reports migraines for 7 weeks and the stress has been hurting my stomach. I am unable to eat anything and it hurts very bad. Patiet has a 10 BM per day. She is drinking Miralax to prevent blockage. No difficulty with urine flow. Pelvic floor throbbs when her stomach hurts.    Patient Stated Goals  reduce pain  in abdomen    Currently in Pain?  Yes    Pain Score  6     Pain Location  Abdomen    Pain Orientation  Anterior    Pain Descriptors / Indicators  Sore;Aching;Spasm;Sharp;Throbbing;Tender;Burning;Tightness    Pain Type  Acute pain    Pain Onset  More than a month ago    Pain Frequency  Constant    Aggravating Factors   breathing, before and after eating    Pain Relieving Factors  heat, TENS unit    Multiple Pain Sites  No         [facility] PT Assessment - 08/25/19 0001      Assessment   Medical Diagnosis  K59.02 Constipation due to pelvic floor outlet obstruction; M62.89 Pelvic floor dysfunction    Referring Provider (PT)  Dr. Sabra Heck Ransohoff    Onset Date/Surgical Date  05/26/19    Prior Therapy  past pelvic floor therapy      Precautions   Precautions  None      Restrictions   Weight Bearing Restrictions  No      Balance Screen   Has the patient fallen in the past 6 months  No    Has the  patient had a decrease in activity level because of a fear of falling?   No    Is the patient reluctant to leave their home because of a fear of falling?   No      Home Environment   Living Environment  Private residence    Available Help at Discharge  Family      Prior Function   Level of Independence  Independent with basic ADLs    Vocation  --   had to quit job due to pain     Cognition   Overall Cognitive Status  Within Functional Limits for tasks assessed      Posture/Postural Control   Posture/Postural Control  Postural limitations    Postural Limitations  Rounded Shoulders;Forward head;Flexed trunk;Posterior pelvic tilt      ROM / Strength   AROM / PROM / Strength  AROM;PROM;Strength      AROM   Lumbar Flexion  decreased by 50% due to abdominal pain    Lumbar Extension  decreased by 50%    Lumbar - Right Side Bend  decreased by 25%    Lumbar - Left Side Bend  decreased by 25%    Lumbar - Right Rotation  decreased by 25%    Lumbar - Left Rotation  decreased by  25%      Strength   Right Hip Flexion  3+/5    Right Hip Extension  3/5    Right Hip External Rotation   4/5    Right Hip Internal Rotation  4/5    Right Hip ABduction  3+/5    Right Hip ADduction  4/5    Left Hip Flexion  3+/5    Left Hip Extension  3/5    Left Hip External Rotation  4/5    Left Hip Internal Rotation  4/5    Left Hip ABduction  3+/5    Left Hip ADduction  4/5      Palpation   Palpation comment  abdominals are very tender to touch, decreased mobility of lower rib cage                Objective measurements completed on examination: See above findings.    Pelvic Floor Special Questions - 08/25/19 0001    Currently Sexually Active  Yes    Is this Painful  No    Urinary Leakage  No    Fecal incontinence  Yes   sleeping, happens randomly      [facility] Adult PT Treatment/Exercise - 08/25/19 0001      Manual Therapy   Manual Therapy  Soft tissue mobilization;Myofascial release    Soft tissue mobilization  gentle circular massage around the abdomen to assist in abdominal perstalic motion fo the large intestines    Myofascial Release  release of the respiratory diaphgram and pelvic diaphragm release with good bowel sounds during the treatment               PT Short Term Goals - 08/25/19 1640      PT SHORT TERM GOAL #1   Title  Pt will show IND with HEP    Time  4    Period  Weeks    Status  New    Target Date  09/22/19      PT SHORT TERM GOAL #2   Title  understand ways to manage pain with meditation, diaphragmatic breathing    Time  4    Period  Weeks    Status  New    Target Date  09/22/19      PT SHORT TERM GOAL #3   Title  Pt will demo increased diaphragmatic excursion and pelvic floor lengthening in order to promote improved toileting mechanics     Time  4    Period  Weeks    Status  New    Target Date  09/22/19      PT SHORT TERM GOAL #4   Title  ---        PT Long Term Goals - 08/25/19 1642      PT LONG TERM GOAL #1    Title  independent wtih HEP and understand how to progress herself    Baseline  ---    Time  8    Period  Weeks    Status  New    Target Date  10/20/19      PT LONG TERM GOAL #2   Title  able to bend forward fully to pick up items due to abdominal pain decreased >/= 50%    Baseline  ---    Time  8    Period  Weeks    Status  New    Target Date  10/20/19      PT LONG TERM GOAL #3   Title  able to eat 2 - 3 meals per day due to abdominal pain decreased >/= 50%    Time  8    Period  Weeks    Status  New    Target Date  10/20/19      PT LONG TERM GOAL #4   Title  able to go out and socialize due to reduction in abdominal pain >/= 50%    Baseline  ---    Time  8    Period  Weeks    Status  New    Target Date  10/20/19      PT LONG TERM GOAL #5   Title  bilateral hip strength >/= 4/5 with minimal to no abdominal pain so patient is able to perform home tasks with greater ease    Time  8    Period  Weeks    Status  New    Target Date  10/20/19             Plan - 08/25/19 1441    Clinical Impression Statement  Patient is a 33 year old female with severe abdominal pain that has become worse in the past 2 months. Patient had a blockage and is taking Miralax to assist the stool to move through the intestines.Lower rib cage has decreaesd movement making it diffilcut to relax the pelvic floor. Patient sitting posture includes flexed posture, posteriorly tilted pelvis, and flat lumbar spine. Bilateral hip strength is 3+/5 due to abdominal pain. Patient is not able to fully flex her lumbar spine due to abdominnal pain. Patient reports her pain prevents her from eating much and she is not able to work. Patient would benefit from skilled therapy to reduce her abdominal pain to improve her quality of life.    Personal Factors and Comorbidities  Comorbidity 1;Fitness;Time since onset of injury/illness/exacerbation;Comorbidity 2;Comorbidity 3+    Comorbidities  small intestine surgery,  laproscopic for endometriosis, IBS    Examination-Activity Limitations  Bend;Sleep;Sit;Dressing;Stand;Lift    Examination-Participation Restrictions  Meal Prep;Cleaning;Community Activity;Interpersonal Relationship;Laundry;Yard Work;Shop    Stability/Clinical Decision Making  Evolving/Moderate complexity    Clinical Decision Making  Moderate    Rehab Potential  Good  PT Frequency  2x / week    PT Duration  8 weeks    PT Treatment/Interventions  Biofeedback;Canalith Repostioning;Cryotherapy;Electrical Stimulation;Moist Heat;Ultrasound;Traction;Balance training;Therapeutic exercise;Therapeutic activities;Functional mobility training;Neuromuscular re-education;Patient/family education;Dry needling;Scar mobilization;Manual techniques;Spinal Manipulations    PT Next Visit Plan  dry needling to lumbar; fascial release; stretches, pelvic floor meditation, soft tissue work to back and forward to abdomen, rib mobility    Consulted and Agree with Plan of Care  Patient       Patient will benefit from skilled therapeutic intervention in order to improve the following deficits and impairments:  Increased fascial restricitons, Increased muscle spasms, Decreased activity tolerance, Pain, Decreased scar mobility, Decreased strength, Decreased mobility  Visit Diagnosis: Muscle weakness (generalized) - Plan: PT plan of care cert/re-cert  Cramp and spasm - Plan: PT plan of care cert/re-cert  Abnormal posture - Plan: PT plan of care cert/re-cert  Acute midline low back pain without sciatica - Plan: PT plan of care cert/re-cert  Generalized abdominal pain - Plan: PT plan of care cert/re-cert     Problem List Patient Active Problem List   Diagnosis Date Noted  . Paresthesia 07/08/2018  . Chronic migraine 07/08/2018    Eulis Fosterheryl , PT 08/25/19 4:54 PM   [facility] [address], [address] [place], [place], [address] Phone: [phone]   Fax:   [phone]  Name: Charlene Ferguson MRN: [id number] Date of Birth: 06-05-1986

## 2019-08-27 ENCOUNTER — Ambulatory Visit: Payer: BC Managed Care – PPO | Admitting: Physical Therapy

## 2019-08-27 ENCOUNTER — Ambulatory Visit
Admission: RE | Admit: 2019-08-27 | Discharge: 2019-08-27 | Disposition: A | Discharge status: Home / Self Care | Payer: 59 | Source: Ambulatory Visit | Attending: Neurology | Admitting: Neurology

## 2019-08-27 ENCOUNTER — Encounter: Payer: Self-pay | Admitting: Physical Therapy

## 2019-08-27 ENCOUNTER — Other Ambulatory Visit: Payer: Self-pay

## 2019-08-27 DIAGNOSIS — R293 Abnormal posture: Secondary | ICD-10-CM

## 2019-08-27 DIAGNOSIS — R1084 Generalized abdominal pain: Secondary | ICD-10-CM

## 2019-08-27 DIAGNOSIS — M545 Low back pain, unspecified: Secondary | ICD-10-CM

## 2019-08-27 DIAGNOSIS — M6281 Muscle weakness (generalized): Secondary | ICD-10-CM

## 2019-08-27 DIAGNOSIS — R252 Cramp and spasm: Secondary | ICD-10-CM

## 2019-08-27 DIAGNOSIS — R519 Headache, unspecified: Secondary | ICD-10-CM

## 2019-08-27 DIAGNOSIS — G8929 Other chronic pain: Secondary | ICD-10-CM

## 2019-08-27 NOTE — Patient Instructions (Signed)

## 2019-08-27 NOTE — Therapy (Signed)
[facility] [address] [place], [place], [address] Phone: [phone]   Fax:  [phone]  Physical Therapy Treatment  Patient Details  Name: LESSLIE MOSSA MRN: [id number] Date of Birth: Jan 26, 1986 Referring Provider (PT): Dr. Lanelle Bal Ransohoff   Encounter Date: 08/27/2019  PT End of Session - 08/27/19 1106    Visit Number  2    Date for PT Re-Evaluation  10/20/19    Authorization Type  [employer]    PT Start Time  1100    PT Stop Time  1200    PT Time Calculation (min)  60 min    Activity Tolerance  Patient tolerated treatment well    Behavior During Therapy  [facility] for tasks assessed/performed       Past Medical History:  Diagnosis Date  . Anxiety   . Depression   . Fibromyalgia   . Heart murmur   . IBS (irritable bowel syndrome)   . Kidney stones   . Migraine   . Motion sickness    car back seat  . Neuropathy   . Ovarian cyst 2010   bilateral side. R cyst has been removed 2016  . SBO (small bowel obstruction) ([place])    2010  . 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 ABDOMINAL SURGERY    . NASAL SEPTOPLASTY W/ TURBINOPLASTY Bilateral 11/21/2017   Procedure: NASAL SEPTOPLASTY WITH TURBINATE REDUCTION;  Surgeon: Margaretha Sheffield, MD;  Location: [place];  Service: ENT;  Laterality: Bilateral;  . OVARIAN CYST SURGERY    . SMALL INTESTINE SURGERY     at birth to remove volvulus    There were no vitals filed for this visit.  Subjective Assessment - 08/27/19 1016    Subjective  I had an appointment with my GI specialist and he ordered a CT scan and showed I am really constipated. I am seeing a person who specializes in pelvic floor and determine if I should have a colon removed or cholostomy. I will be placed on something for my nerve pain.    Patient Stated Goals  reduce pain in abdomen     Currently in Pain?  Yes    Pain Score  6     Pain Location  Abdomen    Pain Orientation  Anterior    Pain Descriptors / Indicators  Aching;Burning;Sore;Spasm;Tender;Tightness    Pain Type  Acute pain    Pain Onset  More than a month ago    Pain Frequency  Constant    Aggravating Factors   breathing, before and after eating    Pain Relieving Factors  heat, TENS unit    Multiple Pain Sites  Yes    Pain Score  5    Pain Location  Back    Pain Orientation  Posterior    Pain Descriptors / Indicators  Sharp;Tightness    Pain Type  Acute pain    Pain Onset  More than a month ago    Pain Frequency  Constant    Aggravating Factors   walking, constipated    Pain Relieving Factors  BM, TENS unit                       [facility] Adult PT Treatment/Exercise - 08/27/19 0001      Modalities   Modalities  Electrical Stimulation;Moist  Heat      Moist Heat Therapy   Number Minutes Moist Heat  20 Minutes    Moist Heat Location  Lumbar Spine   abdominal     Electrical Stimulation   Electrical Stimulation Location  abdominal    Electrical Stimulation Action  IFC    Electrical Stimulation Parameters  to patient tolerance    Electrical Stimulation Goals  Pain      Manual Therapy   Manual Therapy  Soft tissue mobilization;Myofascial release    Manual therapy comments  used assistive device to lift the facia off the tissue on the lumbar and abdominal, Iastim to the lumbar and abdominals in sidely    Myofascial Release  Quadruped pulling the back tissue forward ot he abdomianl tissue to release the fascia, quadruped pulling the fascia and intestines off the spine to improve tissu mobility and work on constipation, sidely soft tissue work to the abdomen to move the intestines and work on the constipation       Trigger Point Dry Needling - 08/27/19 0001    Consent Given?  Yes    Education Handout Provided  Yes    Muscles Treated Back/Hip  Lumbar multifidi    Lumbar multifidi Response   Twitch response elicited;Palpable increased muscle length           PT Education - 08/27/19 1105    Education Details  information on dry needling    Person(s) Educated  Patient    Methods  Explanation;Handout    Comprehension  Verbalized understanding       PT Short Term Goals - 08/25/19 1640      PT SHORT TERM GOAL #1   Title  Pt will show IND with HEP    Time  4    Period  Weeks    Status  New    Target Date  09/22/19      PT SHORT TERM GOAL #2   Title  understand ways to manage pain with meditation, diaphragmatic breathing    Time  4    Period  Weeks    Status  New    Target Date  09/22/19      PT SHORT TERM GOAL #3   Title  Pt will demo increased diaphragmatic excursion and pelvic floor lengthening in order to promote improved toileting mechanics     Time  4    Period  Weeks    Status  New    Target Date  09/22/19      PT SHORT TERM GOAL #4   Title  ---        PT Long Term Goals - 08/25/19 1642      PT LONG TERM GOAL #1   Title  independent wtih HEP and understand how to progress herself    Baseline  ---    Time  8    Period  Weeks    Status  New    Target Date  10/20/19      PT LONG TERM GOAL #2   Title  able to bend forward fully to pick up items due to abdominal pain decreased >/= 50%    Baseline  ---    Time  8    Period  Weeks    Status  New    Target Date  10/20/19      PT LONG TERM GOAL #3   Title  able to eat 2 - 3 meals per day due to abdominal pain decreased >/=  50%    Time  8    Period  Weeks    Status  New    Target Date  10/20/19      PT LONG TERM GOAL #4   Title  able to go out and socialize due to reduction in abdominal pain >/= 50%    Baseline  ---    Time  8    Period  Weeks    Status  New    Target Date  10/20/19      PT LONG TERM GOAL #5   Title  bilateral hip strength >/= 4/5 with minimal to no abdominal pain so patient is able to perform home tasks with greater ease    Time  8    Period  Weeks    Status  New     Target Date  10/20/19            Plan - 08/27/19 1250    Clinical Impression Statement  Patient was in severe pain today. Due to her pain she has difficulty with manuevering in bed. Patient had trigger points that referred into the pelvic floor with dry needling. Patient had reduction of fascial restrictions after manual work. Patient had decreased pain after therapy. Patient sits in a flexed posture due to pain. Patient will benefit from skilled therapy to reduce her abdominal pain to improve quality of life.    Personal Factors and Comorbidities  Comorbidity 1;Fitness;Time since onset of injury/illness/exacerbation;Comorbidity 2;Comorbidity 3+    Comorbidities  small intestine surgery, laproscopic for endometriosis, IBS    Examination-Activity Limitations  Bend;Sleep;Sit;Dressing;Stand;Lift    Stability/Clinical Decision Making  Evolving/Moderate complexity    Rehab Potential  Good    PT Frequency  2x / week    PT Treatment/Interventions  Biofeedback;Canalith Repostioning;Cryotherapy;Electrical Stimulation;Moist Heat;Ultrasound;Traction;Balance training;Therapeutic exercise;Therapeutic activities;Functional mobility training;Neuromuscular re-education;Patient/family education;Dry needling;Scar mobilization;Manual techniques;Spinal Manipulations    PT Next Visit Plan  dry needling to lumbar; fascial release; stretches, pelvic floor meditation, soft tissue work to back and forward to abdomen, rib mobility; e- stim    Consulted and Agree with Plan of Care  Patient       Patient will benefit from skilled therapeutic intervention in order to improve the following deficits and impairments:  Increased fascial restricitons, Increased muscle spasms, Decreased activity tolerance, Pain, Decreased scar mobility, Decreased strength, Decreased mobility  Visit Diagnosis: Muscle weakness (generalized)  Cramp and spasm  Abnormal posture  Acute midline low back pain without sciatica  Generalized  abdominal pain     Problem List Patient Active Problem List   Diagnosis Date Noted  . Paresthesia 07/08/2018  . Chronic migraine 07/08/2018    Eulis Fosterheryl Raiya Stainback, PT 08/27/19 1:14 PM   [facility] [address] [place], [place], [address] Phone: [phone]   Fax:  [phone]  Name: Darryll CapersJuana H Shadle MRN: [id number] Date of Birth: Jun 22, 1986

## 2019-08-31 ENCOUNTER — Telehealth: Payer: Self-pay | Admitting: Neurology

## 2019-08-31 ENCOUNTER — Telehealth (INDEPENDENT_AMBULATORY_CARE_PROVIDER_SITE_OTHER): Payer: BC Managed Care – PPO | Admitting: Neurology

## 2019-08-31 ENCOUNTER — Encounter: Payer: Self-pay | Admitting: Neurology

## 2019-08-31 DIAGNOSIS — IMO0002 Reserved for concepts with insufficient information to code with codable children: Secondary | ICD-10-CM

## 2019-08-31 DIAGNOSIS — G43709 Chronic migraine without aura, not intractable, without status migrainosus: Secondary | ICD-10-CM | POA: Diagnosis not present

## 2019-08-31 MED ORDER — AJOVY 225 MG/1.5ML ~~LOC~~ SOAJ: 225.0000 mg | SUBCUTANEOUS | 5 refills | Status: DC

## 2019-08-31 NOTE — Telephone Encounter (Signed)
Please find out if Ajovy or Emagality is preferred for her insurance. She is on Aimovig 70 mg, has chronic constipation, worried about higher dose, more side effect. Also keep in mind patient may not have health insurance, any assistant programs available? Let me know so I can order, this week. Patient is due for injection this week.

## 2019-08-31 NOTE — Telephone Encounter (Signed)
Ajovy was another option.

## 2019-08-31 NOTE — Progress Notes (Signed)
Virtual Visit via Video Note  I connected with Charlene Ferguson on 08/31/19 at  3:15 PM EST by a video enabled telemedicine application and verified that I am speaking with the correct person using two identifiers.  Location: Patient: At her home  Provider: In the office    I discussed the limitations of evaluation and management by telemedicine and the availability of in person appointments. The patient expressed understanding and agreed to proceed.  History of Present Illness: HISTORY Charlene Broussard Lopezis a 33 years old female, seen in request byher primary care physician [name], [name],for evaluation of chronic migraine, initial evaluation was on July 08, 2018.  I have reviewed and summarized the referring note from the referring physician.She had a past medical history of kidney stone, cardiac murmur.  She reported a history of chronic migraine in her 28s, gradually getting worse, now she is having migraine headache every day, bilateral retro-orbital area severe pounding headache light noise sensitivity, blurry vision, she also complains of bilateral fingertips, feet paresthesia, whole body achy pain, was given the diagnosis of fibromyalgia, she works as a Emergency planning/management officer, it is hard for her to work through her job, complains of memory loss, even difficulty writing her own name, I personally reviewed CT head without contrast in July 2017 that was normal Right laboratory evaluations in July 2019 showed normal CBC hemoglobin of 12.8, normal CMP, creatinine of 0.66, INR of 1.02,  Update 10/23/2018 MW:[id number] she reports her headaches are better.She is taking Nortriptyline 20 mg at bedtime. Weatherchange and stress are triggers.She just had a surgery to have her right fallopian tube removed and is still recovering.In the last month she is having once a week migraines. She is taking Imitrex once a week. Within an 1 hour or so the headache is gone. The headache is behind her  eyes,and on the right and left side of her head. 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 She isso thankful that her headaches are no longer daiyl. She reports feeling tired all the time, despite sleeping well at night. She has history of ADHD, she will be seeing a psychiatrist in a few weeks to discuss stimulant medication.She reports that he PCP has checked her TSH recently and it was normal. She presents today for follow-up.  Update April 22, 2019 SS:33 year old with history of chronic migraine,she is no longer taking nortriptyline, she stopped the medication due to side effect of fatigue, felt it was making her headaches worse. She is currently not on any preventative medication. She says she is having on average 2-3 migraines a week. She says weather, storms, increasing barometric pressure aretriggers for her migraines. She will take Imitrex for headache, it will take the headache away. Her primary doctor is prescribing Fioricet.She says she may take abortive treatment once a week. 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 She had a fallopian tube removed, is doing better. She says with a decrease in stress level, her diffuse achy body pain has improved.  Update August 17, 2019 SS: Over the last several months she has had multiple telephone calls for worsening headache.  When last seen in July, she was doing fairly well.  She stopped nortriptyline due to side effects, she was started on Topamax, but this was discontinued due to history of kidney stones.  She was switched to Maxalt, as Imitrex was not completely beneficial.  She was in the ER 08/03/2019 for migraine headache, she was treated with  Decadron, Benadryl, Toradol, and Reglan.  She was seen in our office 11/11 for migraine infusion, medications were adjusted propanolol 40 mg twice daily, start Aimovig, Switch to Relpax, tizanidine, diclofenac potassium as needed.  Imitrex 6 mg SQ was sent in, after  multiple telephone calls.  She was at the [facility] ER 11/18 for migraine infusion.  Today via virtual visit, continues to complain of chronic daily headache, also has chronic abdominal pain.  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  She is requesting an MRI of the brain, she is convinced she has abnormality.  She is under the care of a psychiatrist.  She complains of tingling in her lips and face, she says this is fairly new.  In the past she has tried gabapentin without benefit.  She said her primary doctor recently gave her Roselyn Meier to try, it didn't help.  She was unable to get Imitrex injections filled due to insurance saying she maxed out of her medications.  She has done 1 injection of Aimovig. She tells me historically, she has clusters of continuous migraines, that will let up after several months.   Update August 31, 2019 SS: MRI of the brain 08/27/2019 IMPRESSION: This MRI of the brain without contrast shows the following: 1.   Few punctate T2/FLAIR hyperintense foci in the subcortical or deep white matter.  This is a nonspecific finding and most likely represents either the sequela of migraine headaches or minimal chronic microvascular ischemic change.  None of the foci appear to be acute and there are no new foci compared to the 07/27/2018 MRI. 2.    There are no acute findings.  She remains on Aimovig 70 mg monthly injection, due for next injection in 2 days, switching to higher dose of 140 mg.  She is worried that the side effect of constipation, as this is chronic for her.  She remains under the care of GI, and has been to the ER recently for abdominal pain.  She is also in physical therapy for constipation.  She continues to report daily headache.  She will take Relpax, diclofenac potassium, tizanidine, Zofran as needed for migraine headache, will make her fall asleep. She has health insurance for 1 more month.  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  She  reports intermittent tremor to her hands and legs, more on the left side, is random, happens when she is holding something overall, for the last 5 years. Overall, her mood seems better.   Observations/Objective: Lying in the bed, is alert, answer questions appropriately, speech is clear, laughing at times, seems more upbeat, facial symmetry noted, no arm drift, slow to rise out of bed to ambulate, gait is intact   Assessment and Plan: 1. Chronic migraine headaches  -Continues to complain of chronic daily headache -Has received 1- 70 mg Aimovig injection, planning to increase up to 140 mg, but worried about side effect of constipation, as this is already a big issue for her, switch to Ajovy or Emgality, as constipation is less reported, will determine which is best for her insurance, also may not have insurance in the near future -I offered Botox therapy, but she is very afraid of needles -Continue propanolol 40 mg twice a day -Continue Relpax, diclofenac potassium, tizanidine, Zofran as needed for migraine headache -Has tried Ubrelvy, Topamax, Imitrex, Maxalt, nortriptyline, gabapentin without benefit, insurance would not cover Imitrex injection -She is also taking Lamictal, Lyrica, and Cymbalta  which may benefit headache -MRI of the brain 08/27/2019 showed few punctate foci in the subcortical and deep white matter, nonspecific finding, most likely represents sequelae of migraine headaches or minimal chronic microvascular ischemic change, none are acute or new compared to prior MRI November 2019, no acute findings  Follow Up Instructions: 4 months with Dr. Terrace ArabiaYan    I discussed the assessment and treatment plan with the patient. The patient was provided an opportunity to ask questions and all were answered. The patient agreed with the plan and demonstrated an understanding of the instructions.   The patient was advised to call back or seek an in-person evaluation if the symptoms worsen or if the  condition fails to improve as anticipated.  I provided 15 minutes of non-face-to-face time during this encounter.  Otila KluverSarah Damani Kelemen, AGNP-C, DNP  [facility] [address], [address] [place], [place] [address] [phone]

## 2019-08-31 NOTE — Telephone Encounter (Signed)
I will order Ajovy, stop Aimovig. Please let the patient know. Can we try to get this for her this week. Also, schedule 4 month follow-up with Dr.Yan.

## 2019-08-31 NOTE — Addendum Note (Signed)
Addended by: Suzzanne Cloud on: 08/31/2019 05:06 PM   Modules accepted: Orders

## 2019-09-01 ENCOUNTER — Telehealth: Payer: Self-pay | Admitting: Neurology

## 2019-09-01 MED ORDER — AJOVY 225 MG/1.5ML ~~LOC~~ SOAJ: 225.0000 mg | SUBCUTANEOUS | 4 refills | Status: DC

## 2019-09-01 NOTE — Telephone Encounter (Signed)
I called pt.  I relayed that we will switch to ajovy (90 day supply while she has insurance at this time).  I told her to get on line [email or web address] and access savings card to use at pharmacy.  They may still require PA but this should be bypassed for her to get medication.  I will fill out PAP for her to initiate when she has no insurance.  She may need to call them to and see what they say when to fill out.  She verbalized understanding. Made 12/31/18 appt with Dr. Krista Blue (4 mo RV).

## 2019-09-01 NOTE — Telephone Encounter (Signed)
Tell her to let's switch to Ajovy, try to get 3 month supply. In the meantime, please try to get her setup for [facility], may need to talk with Hinton Dyer.

## 2019-09-01 NOTE — Telephone Encounter (Signed)
See other note

## 2019-09-02 NOTE — Telephone Encounter (Signed)
I got the [facility] financial assistance p/w and mailed to pt. (if this is something she would like to do).

## 2019-09-03 ENCOUNTER — Encounter: Payer: Self-pay | Admitting: Physical Therapy

## 2019-09-03 ENCOUNTER — Ambulatory Visit: Payer: BC Managed Care – PPO | Admitting: Physical Therapy

## 2019-09-03 ENCOUNTER — Other Ambulatory Visit: Payer: Self-pay

## 2019-09-03 DIAGNOSIS — M6281 Muscle weakness (generalized): Secondary | ICD-10-CM

## 2019-09-03 DIAGNOSIS — M545 Low back pain, unspecified: Secondary | ICD-10-CM

## 2019-09-03 DIAGNOSIS — R293 Abnormal posture: Secondary | ICD-10-CM

## 2019-09-03 DIAGNOSIS — R1084 Generalized abdominal pain: Secondary | ICD-10-CM

## 2019-09-03 DIAGNOSIS — R252 Cramp and spasm: Secondary | ICD-10-CM

## 2019-09-03 NOTE — Therapy (Signed)
[facility] [address], [address] [place], [place], [address] Phone: [phone]   Fax:  [phone]  Physical Therapy Treatment  Patient Details  Name: Charlene CapersJuana H Polidori MRN: [id number] Date of Birth: [birth year] Referring Provider (PT): Dr. Sabra Heckavid Franklin Ransohoff   Encounter Date: 09/03/2019  PT End of Session - 09/03/19 1108    Visit Number  3    Date for PT Re-Evaluation  10/20/19    Authorization Type  BCBS    PT Start Time  1100    PT Stop Time  1140    PT Time Calculation (min)  40 min    Activity Tolerance  Patient tolerated treatment well    Behavior During Therapy  [facility] for tasks assessed/performed       Past Medical History:  Diagnosis Date  . Anxiety   . Depression   . Fibromyalgia   . Heart murmur   . IBS (irritable bowel syndrome)   . Kidney stones   . Migraine   . Motion sickness    car back seat  . Neuropathy   . Ovarian cyst 2010   bilateral side. R cyst has been removed 2016  . SBO (small bowel obstruction) (HCC)    2010  . 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 ABDOMINAL SURGERY    . NASAL SEPTOPLASTY W/ TURBINOPLASTY Bilateral 11/21/2017   Procedure: NASAL SEPTOPLASTY WITH TURBINATE REDUCTION;  Surgeon: Vernie MurdersJuengel, Paul, MD;  [facility];  Service: ENT;  Laterality: Bilateral;  . OVARIAN CYST SURGERY    . SMALL INTESTINE SURGERY     at birth to remove volvulus    There were no vitals filed for this visit.  Subjective Assessment - 09/03/19 1103    Subjective  I still have pain. I got my brain scan back. I still have the white spots and they did not change. They are still trying to fiqure out why I have migraines.    Patient Stated Goals  reduce pain in abdomen    Currently in Pain?  Yes    Pain Score  5     Pain Location  Abdomen    Pain Orientation  Anterior    Pain  Descriptors / Indicators  Aching;Burning;Sore;Spasm;Tender;Tightness    Pain Type  Acute pain    Pain Onset  More than a month ago    Pain Frequency  Constant    Aggravating Factors   breathing, before and after eating    Pain Relieving Factors  heat, TENS unit    Multiple Pain Sites  No    Pain Score  3    Pain Location  Back    Pain Orientation  Posterior    Pain Descriptors / Indicators  Sharp;Tightness    Pain Type  Acute pain    Pain Onset  More than a month ago    Pain Frequency  Constant    Aggravating Factors   walking, constipated    Pain Relieving Factors  BM, TENS unit                       [facility] Adult PT Treatment/Exercise - 09/03/19 0001      Self-Care   Self-Care  Other Self-Care Comments    Other Self-Care Comments   discussed with patient on using the meditation you tube video  daily, gave patient you tube videos to explain the pain       Lumbar Exercises: Stretches   Lower Trunk Rotation  2 reps;30 seconds   each side   Prone on Elbows Stretch  4 reps;10 seconds    Quadruped Mid Back Stretch  1 rep;60 seconds   while therapist does soft tissue work to the lumbar paraspin   Other Lumbar Stretch Exercise  lay on side with therapist holding top arm and mobilizae the rib cage bilaterally      Manual Therapy   Manual Therapy  Soft tissue mobilization    Soft tissue mobilization  soft tissue work to lumbar        Trigger Point Dry Needling - 09/03/19 0001    Consent Given?  Yes    Education Handout Provided  Previously provided    Muscles Treated Back/Hip  Lumbar multifidi    Lumbar multifidi Response  Twitch response elicited;Palpable increased muscle length           PT Education - 09/03/19 1114    Education Details  you tube videos on pain and meditation;Access Code: [email or web address]    Person(s) Educated  Patient    Methods  Explanation;Handout    Comprehension  Verbalized understanding       PT Short Term Goals - 09/03/19 1149      PT  SHORT TERM GOAL #1   Title  Pt will show IND with HEP    Time  4    Period  Weeks    Status  On-going    Target Date  09/22/19      PT SHORT TERM GOAL #2   Title  understand ways to manage pain with meditation, diaphragmatic breathing    Baseline  just educated patient    Time  4    Period  Weeks    Status  On-going    Target Date  09/22/19      PT SHORT TERM GOAL #3   Title  Pt will demo increased diaphragmatic excursion and pelvic floor lengthening in order to promote improved toileting mechanics     Time  4    Period  Weeks    Status  On-going    Target Date  09/22/19        PT Long Term Goals - 08/25/19 1642      PT LONG TERM GOAL #1   Title  independent wtih HEP and understand how to progress herself    Baseline  ---    Time  8    Period  Weeks    Status  New    Target Date  10/20/19      PT LONG TERM GOAL #2   Title  able to bend forward fully to pick up items due to abdominal pain decreased >/= 50%    Baseline  ---    Time  8    Period  Weeks    Status  New    Target Date  10/20/19      PT LONG TERM GOAL #3   Title  able to eat 2 - 3 meals per day due to abdominal pain decreased >/= 50%    Time  8    Period  Weeks    Status  New    Target Date  10/20/19      PT LONG TERM GOAL #4   Title  able to go out and socialize due to reduction in abdominal pain >/= 50%  Baseline  ---    Time  8    Period  Weeks    Status  New    Target Date  10/20/19      PT LONG TERM GOAL #5   Title  bilateral hip strength >/= 4/5 with minimal to no abdominal pain so patient is able to perform home tasks with greater ease    Time  8    Period  Weeks    Status  New    Target Date  10/20/19            Plan - 09/03/19 1145    Clinical Impression Statement  Patient has less back pain today. Patient is moving with greater ease today. Patient is having trouble managing her pain so today educated her on meditation and you tube videos on pain. Patient has improve tissue  mobility for lumbar and abdominal tissue. Patient still sits in a flexed posture. Patient will benefit from skilled therapy to reduce her abdominal and back pain to improve quality of life.    Personal Factors and Comorbidities  Comorbidity 1;Fitness;Time since onset of injury/illness/exacerbation;Comorbidity 2;Comorbidity 3+    Comorbidities  small intestine surgery, laproscopic for endometriosis, IBS    Examination-Activity Limitations  Bend;Sleep;Sit;Dressing;Stand;Lift    Stability/Clinical Decision Making  Evolving/Moderate complexity    Rehab Potential  Good    PT Frequency  2x / week    PT Duration  8 weeks    PT Treatment/Interventions  Biofeedback;Canalith Repostioning;Cryotherapy;Electrical Stimulation;Moist Heat;Ultrasound;Traction;Balance training;Therapeutic exercise;Therapeutic activities;Functional mobility training;Neuromuscular re-education;Patient/family education;Dry needling;Scar mobilization;Manual techniques;Spinal Manipulations    PT Next Visit Plan  dry needling to lumbar; fascial release; stretches, pelvic floor meditation, soft tissue work to back and forward to abdomen, rib mobility; e- stim    PT Home Exercise Plan  Access Code: [email or web address]    Consulted and Agree with Plan of Care  Patient       Patient will benefit from skilled therapeutic intervention in order to improve the following deficits and impairments:  Increased fascial restricitons, Increased muscle spasms, Decreased activity tolerance, Pain, Decreased scar mobility, Decreased strength, Decreased mobility  Visit Diagnosis: Muscle weakness (generalized)  Cramp and spasm  Abnormal posture  Acute midline low back pain without sciatica  Generalized abdominal pain     Problem List Patient Active Problem List   Diagnosis Date Noted  . Paresthesia 07/08/2018  . Chronic migraine 07/08/2018    Eulis Foster, PT 09/03/19 11:51 AM   [facility] [address], [address] [place], [place], [address] Phone: [phone]   Fax:  [phone]  Name: SANIYYAH ELSTER MRN: [id number] Date of Birth: 06-06-1986

## 2019-09-03 NOTE — Patient Instructions (Addendum)
   Guided Meditation for Pelvic Floor Relaxation  [employer]    Understanding Pain in less than 5 minutes, and what to do about it!    Pain, the brain and your amazing protectometer - Lorimer Konrad Saha Pain DVD How to Explain Pain to Patients   [employer] - Onnie Graham - Why Things [facility] [address] [place], [place] [address] Phone # [phone] Fax [phone]  Access Code: [email or web address]  URL: [email or web address]  Date: 09/03/2019  Prepared by: Earlie Counts   Exercises Supine Lower Trunk Rotation - 2 reps - 1 sets - 30 sec hold - 1x daily - 7x weekly Prone Press Up on Elbows - 4 reps - 1 sets - 10 sec hold - 1x daily                            - 7x weekly [facility] [address] [place], [place] [address] Phone # [phone] Fax [phone]

## 2019-09-08 ENCOUNTER — Ambulatory Visit: Payer: BC Managed Care – PPO | Admitting: Physical Therapy

## 2019-09-08 ENCOUNTER — Encounter: Payer: Self-pay | Admitting: Physical Therapy

## 2019-09-08 ENCOUNTER — Other Ambulatory Visit: Payer: Self-pay

## 2019-09-08 DIAGNOSIS — M6281 Muscle weakness (generalized): Secondary | ICD-10-CM

## 2019-09-08 DIAGNOSIS — R293 Abnormal posture: Secondary | ICD-10-CM

## 2019-09-08 DIAGNOSIS — R252 Cramp and spasm: Secondary | ICD-10-CM

## 2019-09-08 NOTE — Therapy (Signed)
[facility] [address], [address] [place], [place], [address] Phone: [phone]   Fax:  [phone]  Physical Therapy Treatment  Patient Details  Name: Charlene Ferguson MRN: [id number] Date of Birth: 1985-12-26 Referring Provider (PT): Dr. Sabra Heck Ransohoff   Encounter Date: 09/08/2019  PT End of Session - 09/08/19 1454    Visit Number  4    Date for PT Re-Evaluation  10/20/19    Authorization Type  BCBS    PT Start Time  1445    PT Stop Time  1545    PT Time Calculation (min)  60 min    Activity Tolerance  Patient tolerated treatment well    Behavior During Therapy  [facility] for tasks assessed/performed       Past Medical History:  Diagnosis Date  . Anxiety   . Depression   . Fibromyalgia   . Heart murmur   . IBS (irritable bowel syndrome)   . Kidney stones   . Migraine   . Motion sickness    car back seat  . Neuropathy   . Ovarian cyst 2010   bilateral side. R cyst has been removed 2016  . SBO (small bowel obstruction) (HCC)    2010  . 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 ABDOMINAL SURGERY    . NASAL SEPTOPLASTY W/ TURBINOPLASTY Bilateral 11/21/2017   Procedure: NASAL SEPTOPLASTY WITH TURBINATE REDUCTION;  Surgeon: Vernie Murders, MD;  Location: [facility];  Service: ENT;  Laterality: Bilateral;  . OVARIAN CYST SURGERY    . SMALL INTESTINE SURGERY     at birth to remove volvulus    There were no vitals filed for this visit.  Subjective Assessment - 09/08/19 1449    Subjective  I have increased pain in the upper and lower abdomen. The doctors nurse said I could have an ovarian cyst. If my pain is too bad to go to the ER. Dr. Sung Amabile approved the ultrasound and need to call to set up.    Patient Stated Goals  reduce pain in abdomen    Currently in Pain?  Yes    Pain Score  10-Worst pain ever    Pain Location  Abdomen    Pain Orientation  Upper;Lower    Pain Descriptors / Indicators  Sharp;Squeezing;Constant;Grimacing    Pain Type  Acute pain    Pain Onset  More than a month ago    Pain Frequency  Constant    Aggravating Factors   movement    Pain Relieving Factors  hot compresses, rest    Multiple Pain Sites  No                       [facility] Adult PT Treatment/Exercise - 09/08/19 0001      Modalities   Modalities  Electrical Stimulation;Moist Heat      Moist Heat Therapy   Number Minutes Moist Heat  15 Minutes    Moist Heat Location  Lumbar Spine   abdominal     Electrical Stimulation   Electrical Stimulation Location  abdominal    Electrical Stimulation Action  IFC    Electrical Stimulation Parameters  to patient tolerance, 15 min    Electrical Stimulation Goals  Pain      Manual Therapy   Manual Therapy  Myofascial release  Myofascial Release  fascial release through the layers on the respiratory diaphragm and urogenital diaphragm, release of the right lower abdominal area while monitoring for pain.                PT Short Term Goals - 09/08/19 1531      PT SHORT TERM GOAL #1   Title  Pt will show IND with HEP    Time  4    Period  Weeks    Status  On-going    Target Date  09/22/19      PT SHORT TERM GOAL #2   Title  understand ways to manage pain with meditation, diaphragmatic breathing    Baseline  just educated patient    Time  4    Status  On-going    Target Date  09/22/19      PT SHORT TERM GOAL #3   Title  Pt will demo increased diaphragmatic excursion and pelvic floor lengthening in order to promote improved toileting mechanics     Time  4    Period  Weeks    Status  On-going    Target Date  09/22/19        PT Long Term Goals - 08/25/19 1642      PT LONG TERM GOAL #1   Title  independent wtih HEP and understand how to progress herself    Baseline  ---    Time  8    Period  Weeks    Status  New    Target Date   10/20/19      PT LONG TERM GOAL #2   Title  able to bend forward fully to pick up items due to abdominal pain decreased >/= 50%    Baseline  ---    Time  8    Period  Weeks    Status  New    Target Date  10/20/19      PT LONG TERM GOAL #3   Title  able to eat 2 - 3 meals per day due to abdominal pain decreased >/= 50%    Time  8    Period  Weeks    Status  New    Target Date  10/20/19      PT LONG TERM GOAL #4   Title  able to go out and socialize due to reduction in abdominal pain >/= 50%    Baseline  ---    Time  8    Period  Weeks    Status  New    Target Date  10/20/19      PT LONG TERM GOAL #5   Title  bilateral hip strength >/= 4/5 with minimal to no abdominal pain so patient is able to perform home tasks with greater ease    Time  8    Period  Weeks    Status  New    Target Date  10/20/19            Plan - 09/08/19 1527    Clinical Impression Statement  Patient is in 10/10 pain in the upper and lower abdomen. Dr. Cleophus Molt wants her to have an ultrasound. Myofascial release was done to improve tissue mobiltiy and reduce pain while releasing the fascia. Good bowel noises were heard during the releases. Patient had increased pain in area at times so pressure was readjusted. Patient face looked pale today due to her being in so much pain. Patient thinks she has an ovarian  cyst due to the increased lower abdominal pain. Patient will benefit from skilled therapy to reduce her abdominal and back pain to improve quality of life.    Personal Factors and Comorbidities  Comorbidity 1;Fitness;Time since onset of injury/illness/exacerbation;Comorbidity 2;Comorbidity 3+    Comorbidities  small intestine surgery, laproscopic for endometriosis, IBS    Examination-Activity Limitations  Bend;Sleep;Sit;Dressing;Stand;Lift    Examination-Participation Restrictions  Meal Prep;Cleaning;Community Activity;Interpersonal Relationship;Laundry;Yard Work;Shop    Stability/Clinical Decision  Making  Evolving/Moderate complexity    Rehab Potential  Good    PT Frequency  2x / week    PT Duration  8 weeks    PT Treatment/Interventions  Biofeedback;Canalith Repostioning;Cryotherapy;Electrical Stimulation;Moist Heat;Ultrasound;Traction;Balance training;Therapeutic exercise;Therapeutic activities;Functional mobility training;Neuromuscular re-education;Patient/family education;Dry needling;Scar mobilization;Manual techniques;Spinal Manipulations    PT Next Visit Plan  dry needling to lumbar; fascial release; stretches, pelvic floor meditation, soft tissue work to back and forward to abdomen, rib mobility; e- stim; see what ultrasound shows    PT Home Exercise Plan  Access Code: [email or web address]    Consulted and Agree with Plan of Care  Patient       Patient will benefit from skilled therapeutic intervention in order to improve the following deficits and impairments:  Increased fascial restricitons, Increased muscle spasms, Decreased activity tolerance, Pain, Decreased scar mobility, Decreased strength, Decreased mobility  Visit Diagnosis: Muscle weakness (generalized)  Cramp and spasm  Abnormal posture     Problem List Patient Active Problem List   Diagnosis Date Noted  . Paresthesia 07/08/2018  . Chronic migraine 07/08/2018    Eulis Fosterheryl Adreanne Yono, PT 09/08/19 3:32 PM   [facility] [address], [address] [place], [place], [address] Phone: [phone]   Fax:  [phone]  Name: Charlene Ferguson MRN: [id number] Date of Birth: October 06, 1985

## 2019-09-10 ENCOUNTER — Other Ambulatory Visit: Payer: Self-pay

## 2019-09-10 ENCOUNTER — Ambulatory Visit: Payer: BC Managed Care – PPO | Admitting: Physical Therapy

## 2019-09-10 ENCOUNTER — Encounter: Payer: Self-pay | Admitting: Physical Therapy

## 2019-09-10 DIAGNOSIS — R1084 Generalized abdominal pain: Secondary | ICD-10-CM

## 2019-09-10 DIAGNOSIS — M545 Low back pain, unspecified: Secondary | ICD-10-CM

## 2019-09-10 DIAGNOSIS — M6281 Muscle weakness (generalized): Secondary | ICD-10-CM | POA: Diagnosis not present

## 2019-09-10 DIAGNOSIS — R293 Abnormal posture: Secondary | ICD-10-CM

## 2019-09-10 DIAGNOSIS — R252 Cramp and spasm: Secondary | ICD-10-CM

## 2019-09-10 NOTE — Therapy (Addendum)
[facility] [address], [place], [place], [address] Phone: [phone]   Fax:  [phone]  Physical Therapy Treatment  Patient Details  Name: Charlene Ferguson MRN: [id number] Date of Birth: [birth year] Referring Provider (PT): Dr. Lanelle Bal Ransohoff   Encounter Date: 09/10/2019  PT End of Session - 09/10/19 1143    Visit Number  5    Date for PT Re-Evaluation  10/20/19    Authorization Type  [employer]    PT Start Time  1100    PT Stop Time  1140    PT Time Calculation (min)  40 min    Activity Tolerance  Patient tolerated treatment well;No increased pain    Behavior During Therapy  WFL for tasks assessed/performed       Past Medical History:  Diagnosis Date  . Anxiety   . Depression   . Fibromyalgia   . Heart murmur   . IBS (irritable bowel syndrome)   . Kidney stones   . Migraine   . Motion sickness    car back seat  . Neuropathy   . Ovarian cyst 2010   bilateral side. R cyst has been removed 2016  . SBO (small bowel obstruction) ([place])    2010  . 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 ABDOMINAL SURGERY    . NASAL SEPTOPLASTY W/ TURBINOPLASTY Bilateral 11/21/2017   Procedure: NASAL SEPTOPLASTY WITH TURBINATE REDUCTION;  Surgeon: Margaretha Sheffield, MD;  Location: [facility];  Service: ENT;  Laterality: Bilateral;  . OVARIAN CYST SURGERY    . SMALL INTESTINE SURGERY     at birth to remove volvulus    There were no vitals filed for this visit.  Subjective Assessment - 09/10/19 1104    Subjective  I am having less pain today. Today I am having an ultrasound at 1:00 PM.    Patient Stated Goals  reduce pain in abdomen    Currently in Pain?  Yes    Pain Score  5     Pain Location  Abdomen    Pain Orientation  Upper;Lower    Pain Descriptors / Indicators  Sharp;Squeezing;Constant;Grimacing    Pain Type  Acute pain    Pain Onset  More than a month ago    Pain Frequency  Constant    Aggravating Factors   movement    Pain Relieving Factors  hot compresses, rest    Multiple Pain Sites  Yes    Pain Score  5    Pain Location  Back    Pain Orientation  Lower    Pain Descriptors / Indicators  Tightness    Pain Type  Acute pain    Pain Onset  More than a month ago    Pain Frequency  Constant    Aggravating Factors   walking, constipated    Pain Relieving Factors  BM, TENS unit                       [facility] Adult PT Treatment/Exercise - 09/10/19 0001      Electrical Stimulation   Electrical Stimulation Location  lumbar with dry needling    Electrical Stimulation Action  facilitating muscles    Electrical Stimulation Parameters  to twitch    Electrical Stimulation Goals  Tone      Manual Therapy  Manual Therapy  Myofascial release;Soft tissue mobilization    Soft tissue mobilization  using a suction cup to lift the tissue up on the lumbar and adominal area; soft tissue work to bilateral lumbar paraspinals, gluteals, and abdomen, I Love You abdominal massage to promote peristalic motion of the intestines    Myofascial Release  fascial release of the abdoiminal tissue and scars       Trigger Point Dry Needling - 09/10/19 0001    Muscles Treated Back/Hip  Lumbar multifidi;Gluteus minimus;Gluteus medius;Gluteus maximus    Electrical Stimulation Performed with Dry Needling  Yes    E-stim with Dry Needling Details  to twitch for 10 min    Gluteus Minimus Response  Twitch response elicited;Palpable increased muscle length    Gluteus Medius Response  Twitch response elicited;Palpable increased muscle length    Gluteus Maximus Response  Twitch response elicited;Palpable increased muscle length    Lumbar multifidi Response  Twitch response elicited;Palpable increased muscle length             PT Short Term Goals - 09/10/19 1145      PT SHORT TERM GOAL #1    Title  Pt will show IND with HEP    Time  4    Period  Weeks    Status  On-going      PT SHORT TERM GOAL #2   Title  understand ways to manage pain with meditation, diaphragmatic breathing    Time  4    Period  Weeks    Status  Achieved      PT SHORT TERM GOAL #3   Title  Pt will demo increased diaphragmatic excursion and pelvic floor lengthening in order to promote improved toileting mechanics     Time  4    Period  Weeks    Status  On-going        PT Long Term Goals - 08/25/19 1642      PT LONG TERM GOAL #1   Title  independent wtih HEP and understand how to progress herself    Baseline  ---    Time  8    Period  Weeks    Status  New    Target Date  10/20/19      PT LONG TERM GOAL #2   Title  able to bend forward fully to pick up items due to abdominal pain decreased >/= 50%    Baseline  ---    Time  8    Period  Weeks    Status  New    Target Date  10/20/19      PT LONG TERM GOAL #3   Title  able to eat 2 - 3 meals per day due to abdominal pain decreased >/= 50%    Time  8    Period  Weeks    Status  New    Target Date  10/20/19      PT LONG TERM GOAL #4   Title  able to go out and socialize due to reduction in abdominal pain >/= 50%    Baseline  ---    Time  8    Period  Weeks    Status  New    Target Date  10/20/19      PT LONG TERM GOAL #5   Title  bilateral hip strength >/= 4/5 with minimal to no abdominal pain so patient is able to perform home tasks with greater ease    Time  8    Period  Weeks    Status  New    Target Date  10/20/19            Plan - 09/10/19 1143    Clinical Impression Statement  After treatment pain decreased to 2/10 on her back but lower abdomen has not changed. Patient is having an ultrasound to the pelvic area due to the pain she is having. Patient has tightness around the scars on her abdomen. Patient still having difficulty with bowel movements. Patient will benefit from skilled therapy to reduce her abdominal and  back pain to improve quality of life.    Personal Factors and Comorbidities  Comorbidity 1;Fitness;Time since onset of injury/illness/exacerbation;Comorbidity 2;Comorbidity 3+    Comorbidities  small intestine surgery, laproscopic for endometriosis, IBS    Examination-Activity Limitations  Bend;Sleep;Sit;Dressing;Stand;Lift    Examination-Participation Restrictions  Meal Prep;Cleaning;Community Activity;Interpersonal Relationship;Laundry;Yard Work;Shop    Stability/Clinical Decision Making  Evolving/Moderate complexity    Rehab Potential  Good    PT Frequency  2x / week    PT Duration  8 weeks    PT Treatment/Interventions  Biofeedback;Canalith Repostioning;Cryotherapy;Electrical Stimulation;Moist Heat;Ultrasound;Traction;Balance training;Therapeutic exercise;Therapeutic activities;Functional mobility training;Neuromuscular re-education;Patient/family education;Dry needling;Scar mobilization;Manual techniques;Spinal Manipulations    PT Next Visit Plan  dry needling to lumbar; fascial release; stretches, pelvic floor meditation, soft tissue work to back and forward to abdomen, rib mobility; e- stim; see what ultrasound shows    PT Home Exercise Plan  Access Code: [email or web address]    Consulted and Agree with Plan of Care  Patient       Patient will benefit from skilled therapeutic intervention in order to improve the following deficits and impairments:  Increased fascial restricitons, Increased muscle spasms, Decreased activity tolerance, Pain, Decreased scar mobility, Decreased strength, Decreased mobility  Visit Diagnosis: Muscle weakness (generalized)  Cramp and spasm  Abnormal posture  Acute midline low back pain without sciatica  Generalized abdominal pain     Problem List Patient Active Problem List   Diagnosis Date Noted  . Paresthesia 07/08/2018  . Chronic migraine 07/08/2018    Earlie Counts, PT 09/10/19 11:46 AM   [facility] [address], [place], [place], [address] Phone: [phone]   Fax:  [phone]  Name: Charlene Ferguson MRN: [id number] Date of Birth: Jul 24, 1986  PHYSICAL THERAPY DISCHARGE SUMMARY  Visits from Start of Care: 5  Current functional level related to goals / functional outcomes: See above. Patient did not return to therapy to be reassessed.    Remaining deficits: See above.    Education / Equipment: HEP Plan:                                                    Patient goals were not met. Patient is being discharged due to not returning since the last visit.  Thank you for the referral. Earlie Counts, PT 10/26/19 9:05 AM  ?????

## 2019-09-22 ENCOUNTER — Other Ambulatory Visit: Payer: Self-pay

## 2019-09-22 ENCOUNTER — Encounter (HOSPITAL_COMMUNITY): Payer: Self-pay

## 2019-09-22 ENCOUNTER — Encounter (HOSPITAL_COMMUNITY): Payer: Self-pay | Admitting: Psychiatry

## 2019-09-22 ENCOUNTER — Emergency Department (HOSPITAL_COMMUNITY)
Admission: EM | Admit: 2019-09-22 | Discharge: 2019-09-22 | Disposition: A | Discharge status: Home / Self Care | Payer: BC Managed Care – PPO | Source: Home / Self Care | Attending: Emergency Medicine | Admitting: Emergency Medicine

## 2019-09-22 ENCOUNTER — Inpatient Hospital Stay (HOSPITAL_COMMUNITY)
Admission: AD | Admit: 2019-09-22 | Discharge: 2019-09-24 | DRG: 881 | Disposition: A | Discharge status: Home / Self Care | Payer: BC Managed Care – PPO | Source: Intra-hospital | Attending: Psychiatry | Admitting: Psychiatry

## 2019-09-22 DIAGNOSIS — Z881 Allergy status to other antibiotic agents status: Secondary | ICD-10-CM | POA: Diagnosis not present

## 2019-09-22 DIAGNOSIS — G629 Polyneuropathy, unspecified: Secondary | ICD-10-CM | POA: Diagnosis present

## 2019-09-22 DIAGNOSIS — M797 Fibromyalgia: Secondary | ICD-10-CM | POA: Diagnosis present

## 2019-09-22 DIAGNOSIS — K589 Irritable bowel syndrome without diarrhea: Secondary | ICD-10-CM | POA: Diagnosis present

## 2019-09-22 DIAGNOSIS — Z915 Personal history of self-harm: Secondary | ICD-10-CM | POA: Diagnosis not present

## 2019-09-22 DIAGNOSIS — F909 Attention-deficit hyperactivity disorder, unspecified type: Secondary | ICD-10-CM | POA: Diagnosis present

## 2019-09-22 DIAGNOSIS — Z79899 Other long term (current) drug therapy: Secondary | ICD-10-CM | POA: Insufficient documentation

## 2019-09-22 DIAGNOSIS — T426X2A Poisoning by other antiepileptic and sedative-hypnotic drugs, intentional self-harm, initial encounter: Secondary | ICD-10-CM | POA: Insufficient documentation

## 2019-09-22 DIAGNOSIS — G43909 Migraine, unspecified, not intractable, without status migrainosus: Secondary | ICD-10-CM | POA: Diagnosis present

## 2019-09-22 DIAGNOSIS — F329 Major depressive disorder, single episode, unspecified: Principal | ICD-10-CM | POA: Diagnosis present

## 2019-09-22 DIAGNOSIS — E876 Hypokalemia: Secondary | ICD-10-CM | POA: Diagnosis present

## 2019-09-22 DIAGNOSIS — Z885 Allergy status to narcotic agent status: Secondary | ICD-10-CM

## 2019-09-22 DIAGNOSIS — T1491XA Suicide attempt, initial encounter: Secondary | ICD-10-CM

## 2019-09-22 DIAGNOSIS — Z20828 Contact with and (suspected) exposure to other viral communicable diseases: Secondary | ICD-10-CM | POA: Insufficient documentation

## 2019-09-22 DIAGNOSIS — T50902A Poisoning by unspecified drugs, medicaments and biological substances, intentional self-harm, initial encounter: Secondary | ICD-10-CM

## 2019-09-22 DIAGNOSIS — F332 Major depressive disorder, recurrent severe without psychotic features: Secondary | ICD-10-CM | POA: Insufficient documentation

## 2019-09-22 LAB — COMPREHENSIVE METABOLIC PANEL
ALT: 21 U/L (ref 0–44)
AST: 26 U/L (ref 15–41)
Albumin: 4.1 g/dL (ref 3.5–5.0)
Alkaline Phosphatase: 66 U/L (ref 38–126)
Anion gap: 10 (ref 5–15)
BUN: 8 mg/dL (ref 6–20)
CO2: 27 mmol/L (ref 22–32)
Calcium: 9.3 mg/dL (ref 8.9–10.3)
Chloride: 102 mmol/L (ref 98–111)
Creatinine, Ser: 0.71 mg/dL (ref 0.44–1.00)
GFR calc Af Amer: >60 mL/min (ref >60)
GFR calc non Af Amer: >60 mL/min (ref >60)
Glucose, Bld: 90 mg/dL (ref 70–99)
Potassium: 3.8 mmol/L (ref 3.5–5.1)
Sodium: 139 mmol/L (ref 135–145)
Total Bilirubin: 0.5 mg/dL (ref 0.3–1.2)
Total Protein: 7.5 g/dL (ref 6.5–8.1)

## 2019-09-22 LAB — RAPID URINE DRUG SCREEN, HOSP PERFORMED
Amphetamines: POSITIVE — AB
Barbiturates: POSITIVE — AB
Benzodiazepines: NOT DETECTED
Cocaine: NOT DETECTED
Opiates: NOT DETECTED
Tetrahydrocannabinol: NOT DETECTED

## 2019-09-22 LAB — PREGNANCY, URINE: Preg Test, Ur: NEGATIVE

## 2019-09-22 LAB — SALICYLATE LEVEL: Salicylate Lvl: <7 mg/dL — ABNORMAL LOW (ref 7.0–30.0)

## 2019-09-22 LAB — CBC WITH DIFFERENTIAL/PLATELET
Abs Immature Granulocytes: 0.03 10*3/uL (ref 0.00–0.07)
Basophils Absolute: 0 10*3/uL (ref 0.0–0.1)
Basophils Relative: 0 %
Eosinophils Absolute: 0.2 10*3/uL (ref 0.0–0.5)
Eosinophils Relative: 2 %
HCT: 42.7 % (ref 36.0–46.0)
Hemoglobin: 14.1 g/dL (ref 12.0–15.0)
Immature Granulocytes: 0 %
Lymphocytes Relative: 36 %
Lymphs Abs: 2.8 10*3/uL (ref 0.7–4.0)
MCH: 30 pg (ref 26.0–34.0)
MCHC: 33 g/dL (ref 30.0–36.0)
MCV: 90.9 fL (ref 80.0–100.0)
Monocytes Absolute: 0.7 10*3/uL (ref 0.1–1.0)
Monocytes Relative: 9 %
Neutro Abs: 4.2 10*3/uL (ref 1.7–7.7)
Neutrophils Relative %: 53 %
Platelets: 367 10*3/uL (ref 150–400)
RBC: 4.7 MIL/uL (ref 3.87–5.11)
RDW: 12.9 % (ref 11.5–15.5)
WBC: 7.9 10*3/uL (ref 4.0–10.5)
nRBC: 0 % (ref 0.0–0.2)

## 2019-09-22 LAB — RESPIRATORY PANEL BY RT PCR (FLU A&B, COVID)
Influenza A by PCR: NEGATIVE
Influenza B by PCR: NEGATIVE
SARS Coronavirus 2 by RT PCR: NEGATIVE

## 2019-09-22 LAB — ETHANOL: Alcohol, Ethyl (B): <10 mg/dL (ref <10)

## 2019-09-22 LAB — ACETAMINOPHEN LEVEL: Acetaminophen (Tylenol), Serum: <10 ug/mL — ABNORMAL LOW (ref 10–30)

## 2019-09-22 MED ORDER — ALUM & MAG HYDROXIDE-SIMETH 200-200-20 MG/5ML PO SUSP: 30.0000 mL | ORAL | Status: DC | PRN

## 2019-09-22 MED ORDER — PREGABALIN 100 MG PO CAPS
100.0000 mg | ORAL_CAPSULE | Freq: Two times a day (BID) | ORAL | Status: DC
  Administered 2019-09-22 – 2019-09-24 (×4): 100 mg via ORAL
  Filled 2019-09-22 (×4): qty 1

## 2019-09-22 MED ORDER — SODIUM CHLORIDE 0.9 % IV BOLUS
1000.0000 mL | Freq: Once | INTRAVENOUS | Status: AC
  Administered 2019-09-22: 04:00:00 1000 mL via INTRAVENOUS

## 2019-09-22 MED ORDER — VITAMIN D (ERGOCALCIFEROL) 1.25 MG (50000 UNIT) PO CAPS
50000.0000 [IU] | ORAL_CAPSULE | ORAL | Status: DC
  Filled 2019-09-22: qty 1

## 2019-09-22 MED ORDER — MAGNESIUM HYDROXIDE 400 MG/5ML PO SUSP: 30.0000 mL | Freq: Every day | ORAL | Status: DC | PRN

## 2019-09-22 MED ORDER — LAMOTRIGINE 200 MG PO TABS
200.0000 mg | ORAL_TABLET | Freq: Every day | ORAL | Status: DC
  Administered 2019-09-22 – 2019-09-24 (×3): 200 mg via ORAL
  Filled 2019-09-22 (×2): qty 1
  Filled 2019-09-22: qty 2
  Filled 2019-09-22 (×2): qty 1
  Filled 2019-09-22: qty 2
  Filled 2019-09-22: qty 1

## 2019-09-22 MED ORDER — PROMETHAZINE HCL 25 MG PO TABS
12.5000 mg | ORAL_TABLET | Freq: Four times a day (QID) | ORAL | Status: DC | PRN
  Administered 2019-09-22: 12.5 mg via ORAL
  Filled 2019-09-22: qty 1

## 2019-09-22 MED ORDER — ELETRIPTAN HYDROBROMIDE 20 MG PO TABS: 40.0000 mg | ORAL_TABLET | ORAL | Status: DC | PRN

## 2019-09-22 MED ORDER — HYOSCYAMINE SULFATE 0.125 MG SL SUBL
0.2500 mg | SUBLINGUAL_TABLET | Freq: Four times a day (QID) | SUBLINGUAL | Status: DC | PRN
  Filled 2019-09-22: qty 2

## 2019-09-22 MED ORDER — ALPRAZOLAM 0.25 MG PO TABS: 0.2500 mg | ORAL_TABLET | Freq: Two times a day (BID) | ORAL | Status: DC | PRN

## 2019-09-22 MED ORDER — ACETAMINOPHEN 325 MG PO TABS: 650.0000 mg | ORAL_TABLET | Freq: Four times a day (QID) | ORAL | Status: DC | PRN

## 2019-09-22 MED ORDER — MOMETASONE FUROATE 0.1 % EX CREA
TOPICAL_CREAM | Freq: Every day | CUTANEOUS | Status: DC
  Filled 2019-09-22 (×2): qty 15

## 2019-09-22 MED ORDER — OLANZAPINE 5 MG PO TBDP
5.0000 mg | ORAL_TABLET | Freq: Every day | ORAL | Status: DC
  Administered 2019-09-22 – 2019-09-23 (×2): 5 mg via ORAL
  Filled 2019-09-22 (×5): qty 1

## 2019-09-22 MED ORDER — ALBUTEROL SULFATE HFA 108 (90 BASE) MCG/ACT IN AERS: 1.0000 | INHALATION_SPRAY | Freq: Four times a day (QID) | RESPIRATORY_TRACT | Status: DC | PRN

## 2019-09-22 MED ORDER — LUBIPROSTONE 24 MCG PO CAPS
24.0000 ug | ORAL_CAPSULE | Freq: Two times a day (BID) | ORAL | Status: DC
  Filled 2019-09-22: qty 1

## 2019-09-22 MED ORDER — POLYETHYLENE GLYCOL 3350 17 G PO PACK: 17.0000 g | PACK | Freq: Every day | ORAL | Status: DC | PRN

## 2019-09-22 MED ORDER — SODIUM CHLORIDE 0.9 % IV BOLUS
1000.0000 mL | Freq: Once | INTRAVENOUS | Status: AC
  Administered 2019-09-22: 1000 mL via INTRAVENOUS

## 2019-09-22 NOTE — ED Notes (Signed)
Attempted again to call report, was told this RN would receive a call back from receiving RN.

## 2019-09-22 NOTE — ED Notes (Signed)
This RN was able to get in contact with [facility] after hours. Staff member of [facility] stated he will call back shortly with arrangements for patient to transport to [facility].

## 2019-09-22 NOTE — ED Notes (Signed)
DC d off unit to [facility] per provider. Pt alert, calm, cooperative, no s/s of distress. DC information given to [employer] for [facility].  Belongings given to [employer] for [facility]. Pt ambulatory off the unit. Pt transported by [employer].

## 2019-09-22 NOTE — ED Notes (Signed)
Report given to Will at [facility]. 

## 2019-09-22 NOTE — ED Provider Notes (Addendum)
[facility]   CSN: [id number] Arrival date & time: 09/22/19  0050     History Chief Complaint  Patient presents with  . Drug Overdose    with intent to harm    Charlene Ferguson is a 33 y.o. female.  Patient to ED after overdose of Ambien around 10:30 pm tonight when she took 5 pills, reporting her intention was self harm. No vomiting. She reports the Ambien was 10 mg tablets, however, she is prescribed 5 mg tablets, so total is unreliable. She denies taking any other medications. She reports a history of self harm. She called her boyfriend and told him what she had done, who in turn called 911.   The history is provided by the patient. No language interpreter was used.  Drug Overdose       Past Medical History:  Diagnosis Date  . Anxiety   . Depression   . Fibromyalgia   . Heart murmur   . IBS (irritable bowel syndrome)   . Kidney stones   . Migraine   . Motion sickness    car back seat  . Neuropathy   . Ovarian cyst 2010   bilateral side. R cyst has been removed 2016  . SBO (small bowel obstruction) (HCC)    2010  . 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 Paresthesia 07/08/2018  . Chronic migraine 07/08/2018    Past Surgical History:  Procedure Laterality Date  . ABDOMINAL SURGERY    . NASAL SEPTOPLASTY W/ TURBINOPLASTY Bilateral 11/21/2017   Procedure: NASAL SEPTOPLASTY WITH TURBINATE REDUCTION;  Surgeon: Vernie Murders, MD;  Location: [facility];  Service: ENT;  Laterality: Bilateral;  . OVARIAN CYST SURGERY    . SMALL INTESTINE SURGERY     at birth to remove volvulus     OB History   No obstetric history on file.     Family History  Problem Relation Age of Onset  . Prostate cancer Neg Hx   . Kidney disease Neg Hx   . Kidney cancer Neg Hx     Social History   Tobacco Use  . Smoking  status: Never Smoker  . Smokeless tobacco: Never Used  Substance Use Topics  . Alcohol use: No  . Drug use: No    Home Medications Prior to Admission medications   Medication Sig Start Date End Date Taking? Authorizing Provider  albuterol (PROVENTIL HFA;VENTOLIN HFA) 108 (90 Base) MCG/ACT inhaler Inhale 2 puffs into the lungs every 6 (six) hours as needed for wheezing or shortness of breath.    [provider]  amphetamine-dextroamphetamine (ADDERALL XR) 10 MG 24 hr capsule Take 10 mg by mouth daily.    [provider]  Cholecalciferol (D3 MAXIMUM STRENGTH) 5000 units capsule Take 5,000 Units by mouth once a week.    [provider]  diclofenac (CATAFLAM) 50 MG tablet Take 1 tablet (50 mg total) by mouth 2 (two) times daily as needed. 08/04/19   Glean Salvo, NP  DULoxetine (CYMBALTA) 60 MG capsule Take 60 mg by mouth daily.    [provider]  eletriptan (RELPAX) 40 MG tablet Take 1 tablet (40 mg total) by mouth as needed for migraine or headache. May repeat in 2 hours if headache persists or recurs. 08/05/19   Glean Salvo, NP  Fremanezumab-vfrm (AJOVY)  225 MG/1.5ML SOAJ Inject 225 mg into the skin every 30 (thirty) days. Inject 225 mg every month 09/01/19   Suzzanne Cloud, NP  lamoTRIgine (LAMICTAL) 200 MG tablet Take 200 mg by mouth daily.     [provider]  lubiprostone (AMITIZA) 24 MCG capsule Take 24 mcg by mouth 2 (two) times daily with a meal.    [provider]  montelukast (SINGULAIR) 10 MG tablet Take 10 mg by mouth at bedtime.    [provider]  norethindrone-ethinyl estradiol (CYCLAFEM,ALYACEN) 0.5/0.75/1-35 MG-MCG tablet Take 1 tablet by mouth daily.    [provider]  ondansetron (ZOFRAN-ODT) 8 MG disintegrating tablet Take 8 mg by mouth every 8 (eight) hours as needed for nausea or vomiting.    [provider]  pregabalin (LYRICA) 100 MG capsule Take 100 mg by mouth 2 (two) times daily.     [provider]  propranolol (INDERAL) 20 MG tablet Take 2 tablets (40 mg total) by mouth 2 (two) times daily. 08/04/19   Suzzanne Cloud, NP  SUMAtriptan (IMITREX) 6 MG/0.5ML SOLN injection Inject 0.5 mLs (6 mg total) into the skin every 2 (two) hours as needed for migraine or headache. May repeat in 2 hours if headache persists or recurs. Patient not taking: Reported on 08/17/2019 08/11/19   Marcial Pacas, MD  tiZANidine (ZANAFLEX) 4 MG tablet Take 1 tablet (4 mg total) by mouth every 6 (six) hours as needed for muscle spasms. 08/05/19   Suzzanne Cloud, NP  triamcinolone (NASACORT ALLERGY 24HR) 55 MCG/ACT AERO nasal inhaler Place 2 sprays into the nose daily.    [provider]  zolpidem (AMBIEN) 5 MG tablet Take 5 mg by mouth at bedtime.    [provider]    Allergies    Ceftriaxone and Morphine and related  Review of Systems   Review of Systems  Constitutional: Negative for chills and fever.  HENT: Negative.   Respiratory: Negative.   Cardiovascular: Negative.   Gastrointestinal: Negative.   Musculoskeletal: Negative.   Skin: Negative.   Neurological: Negative.   Psychiatric/Behavioral: Positive for self-injury and suicidal ideas.    Physical Exam Updated Vital Signs BP 113/85 (BP Location: Right Arm)   Pulse 89   Temp 97.8 F (36.6 C) (Oral)   Resp 15   SpO2 100%   Physical Exam Vitals and nursing note reviewed.  Constitutional:      General: She is not in acute distress.    Appearance: She is well-developed.  HENT:     Head: Normocephalic.  Cardiovascular:     Rate and Rhythm: Normal rate and regular rhythm.     Heart sounds: No murmur.  Pulmonary:     Effort: Pulmonary effort is normal.     Breath sounds: Normal breath sounds. No wheezing, rhonchi or rales.  Abdominal:     General: Bowel sounds are normal.     Palpations: Abdomen is soft.     Tenderness: There is no abdominal tenderness. There is no guarding or rebound.   Musculoskeletal:        General: Normal range of motion.     Cervical back: Normal range of motion and neck supple.  Skin:    General: Skin is warm and dry.  Neurological:     Mental Status: She is alert and oriented to person, place, and time.  Psychiatric:        Thought Content: Thought content includes suicidal ideation.     Comments: Patient reporting overdose  with intention of self harm.     ED Results / Procedures / Treatments   Labs (all labs ordered are listed, but only abnormal results are displayed) Labs Reviewed - No data to display  EKG None  Radiology No results found.  Procedures Procedures (including critical care time)  Medications Ordered in ED Medications - No data to display  ED Course  I have reviewed the triage vital signs and the nursing notes.  Pertinent labs & imaging results that were available during my care of the patient were reviewed by me and considered in my medical decision making (see chart for details).    MDM Rules/Calculators/A&P                      Patient to ED after overdose on Ambien about 3 hours ago. No vomiting. She acknowledges intention of self harm..  Per poison control, watch for CNS depression although at the stated dose, observation for 4-6 hours is felt appropriate. VSS. Labs pending.   Labs reassuring. 4-hour Tylenol is negative.   Blood pressure has decreased during observation period, minimal improvement after 1 liter IVF's. Will order 2nd but anticipate medical clearance at that point.  7:15 - patient awake, drinking fluids, alert, oriented. BP 98 systolic. "It's always low". Patient felt to be at baseline. TTS consultation ordered, patient placed in psych hold and is considered medically cleared.   Final Clinical Impression(s) / ED Diagnoses Final diagnoses:  None   1. Intentional overdose  Rx / DC Orders ED Discharge Orders    None       Elpidio AnisUpstill, Sonika Levins, PA-C 09/22/19 0636    Elpidio AnisUpstill, Menno Vanbergen,  PA-C 09/22/19 [id number]    Derwood KaplanNanavati, Ankit, MD 09/26/19 2221

## 2019-09-22 NOTE — ED Notes (Signed)
Spoke with Lennette Bihari from [employer] and advised to monitor mentation until return to baseline. Also EKG requested.

## 2019-09-22 NOTE — Progress Notes (Signed)
Pt is a 33 year old female, who was admitted voluntarily to [facility].  Pt stated that she has had a long history of depression and anxiety. Pt also has fibromyalgia, migranes, ADD, and IBS.  Pt denied that she tried to kill herself by overdosing on Ambien medication.  Pt said she had stopped taking her medications because of issues with her pharmacy and when she stopped taking her medications, pt's depression increased and it "got super bad." Pt reported that she has strong support from her partner Jeneen Rinks.  Pt said she also has support from other family members and friends.  Pt is tearful during assessment and had difficulty formulating her thoughts. Pt denies SI/HI/AVH.    Pt signed consent forms and RN answered pt's questions.  Pt verbalized understanding of her admission plan of care.  RN oriented pt to the unit, staff, and room.  RN initiated q 15 min safety checks.

## 2019-09-22 NOTE — ED Notes (Signed)
Attempted to call report, nurse will call this RN back

## 2019-09-22 NOTE — Discharge Summary (Signed)
Patient is being moved from [facility] to [facility] bed 302-1.

## 2019-09-22 NOTE — ED Notes (Addendum)
[facility] contacted to get patient [facility]. No answer at this time. Message left on voicemail. Beth RN notified.

## 2019-09-22 NOTE — BH Assessment (Signed)
[place] Assessment Progress Note  Per Hampton Abbot, MD, this pt requires psychiatric hospitalization at this time.  Nonah Mattes, RN has assigned pt to [facility] Rm 302-1; [facility] will be ready to receive pt at 13:00.  Pt has signed Voluntary Admission and Consent for Treatment, as well as Consent to Release Information to pt's psychiatrist, Dr. Laverta Baltimore, and to pt's significant other.  A notification call has been placed to the provider, and signed forms have been faxed to [facility].  Pt's nurse, Melody, has been notified, and agrees to send original paperwork along with pt via [employer], and to call report to [phone].  Jalene Mullet, [place] Coordinator [phone]

## 2019-09-22 NOTE — Tx Team (Signed)
Initial Treatment Plan 09/22/2019 6:56 PM [name] TXM:[id number]    PATIENT STRESSORS: Health problems Loss of job   PATIENT STRENGTHS: Ability for insight Average or above average intelligence Communication skills Supportive family/friends   PATIENT IDENTIFIED PROBLEMS: Depression  Anxiety  ADD                 DISCHARGE CRITERIA:  Ability to meet basic life and health needs Adequate post-discharge living arrangements Motivation to continue treatment in a less acute level of care  PRELIMINARY DISCHARGE PLAN: Outpatient therapy Return to previous living arrangement  PATIENT/FAMILY INVOLVEMENT: This treatment plan has been presented to and reviewed with the patient, [name].  The patient has been given the opportunity to ask questions and make suggestions.  Luna Glasgow, RN 09/22/2019, 6:56 PM
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
## 2019-09-22 NOTE — ED Notes (Signed)
Patient reports she feels like she is  burden on her family because she has so many health problems. Patient states she has 32 year old daughter that she feels is disappointed in her as a mother.

## 2019-09-22 NOTE — ED Notes (Signed)
Psych telehealth at bedside.

## 2019-09-22 NOTE — ED Triage Notes (Signed)
Arrived by [facility] from home. Patient reports taking five 10 mg Ambien with intent to harm/kill self. Patient A&O X4 on arrival. Poison control notified

## 2019-09-22 NOTE — ED Notes (Signed)
This RN attempted to Air traffic controller after hours & weekends. No answer at this time. Charge RN aware.

## 2019-09-22 NOTE — ED Notes (Signed)
Contacted poison control: 4-6 hours observation. Poison control suggests Salicylate level and BMP now and Tylenol level ay 0230.  Possible complications: sedation, hypotension, respiratory compromise and agitation  Fluid resuscitation for hypotension

## 2019-09-22 NOTE — ED Provider Notes (Signed)
Charlene Ferguson is a 33 y.o. female, presenting to the ED with intentional overdose on Ambien.   HPI from Elpidio AnisShari Upstill, PA-C: "Patient to ED after overdose of Ambien around 10:30 pm tonight when she took 5 pills, reporting her intention was self harm. No vomiting. She reports the Ambien was 10 mg tablets, however, she is prescribed 5 mg tablets, so total is unreliable. She denies taking any other medications. She reports a history of self harm. She called her boyfriend and told him what she had done, who in turn called 911."  Past Medical History:  Diagnosis Date  . Anxiety   . Depression   . Fibromyalgia   . Heart murmur   . IBS (irritable bowel syndrome)   . Kidney stones   . Migraine   . Motion sickness    car back seat  . Neuropathy   . Ovarian cyst 2010   bilateral side. R cyst has been removed 2016  . SBO (small bowel obstruction) (HCC)    2010  . 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  Constitutional:      General: She is not in acute distress.    Appearance: She is well-developed. She is not diaphoretic.  HENT:     Head: Normocephalic and atraumatic.     Mouth/Throat:     Mouth: Mucous membranes are moist.     Pharynx: Oropharynx is clear.  Eyes:     Conjunctiva/sclera: Conjunctivae normal.  Cardiovascular:     Rate and Rhythm: Normal rate and regular rhythm.     Pulses: Normal pulses.          Radial pulses are 2+ on the right side and 2+ on the left side.  Pulmonary:     Effort: Pulmonary effort is normal. No respiratory distress.  Abdominal:     Tenderness: There is no guarding.  Musculoskeletal:     Cervical back: Neck supple.     Right lower leg: No edema.     Left lower leg: No edema.  Lymphadenopathy:     Cervical: No cervical adenopathy.  Skin:    General:  Skin is warm and dry.  Neurological:     Mental Status: She is alert and oriented to person, place, and time.  Psychiatric:        Mood and Affect: Mood and affect normal.        Speech: Speech normal.        Behavior: Behavior normal.     ED Course/Procedures     Procedures   Abnormal Labs Reviewed  SALICYLATE LEVEL - Abnormal; Notable for the following components:      Result Value   Salicylate Lvl <7.0 (*)    All other components within normal limits  RAPID URINE DRUG SCREEN, HOSP PERFORMED - Abnormal; Notable for the following components:   Amphetamines POSITIVE (*)    Barbiturates POSITIVE (*)    All other components within normal limits  ACETAMINOPHEN LEVEL - Abnormal; Notable for the following components:   Acetaminophen (Tylenol), Serum <10 (*)    All other components within normal limits    MR BRAIN WO CONTRAST  Result Date: 08/30/2019  [facility] [address], [address] [place], [place] [address] [phone] NEUROIMAGING REPORT STUDY DATE: 08/30/2019 PATIENT NAME: Charlene Ferguson DOB: 27-Feb-1986 MRN: [id number] EXAM: MRI Brain without contrast ORDERING CLINICIAN: Margie Ege, MD CLINICAL HISTORY: 33 year old woman with intractable headaches COMPARISON FILMS: MRI 07/27/2018 TECHNIQUE: MRI of the brain without contrast was obtained utilizing 5 mm axial slices with T1, T2, T2 flair, SWI and diffusion weighted views.  T1 sagittal and T2 coronal views were obtained. CONTRAST: none IMAGING SITE: [facility], [address] [place], [place] FINDINGS: On sagittal images, the spinal cord is imaged caudally to C4 and is normal in caliber.   The contents of the posterior fossa are of normal size and position.   The pituitary gland and optic chiasm appear normal.    Brain volume appears normal.   The ventricles are normal in size and without distortion.  There are no abnormal extra-axial collections of fluid.  The cerebellum and brainstem appears normal.   The deep gray  matter appears normal.  In the hemispheres, there are a few punctate T2/FLAIR hyperintense foci in the subcortical or deep white matter.  None of these appear to be acute.  Compared to the MRI dated 07/27/2018, there are no new foci.  Diffusion weighted images are normal.  Susceptibility weighted images are normal.  The orbits appear normal.   The VIIth/VIIIth nerve complex appears normal.  The mastoid air cells appear normal.  The paranasal sinuses appear normal.  Flow voids are identified within the major intracerebral arteries.     This MRI of the brain without contrast shows the following: 1.   Few punctate T2/FLAIR hyperintense foci in the subcortical or deep white matter.  This is a nonspecific finding and most likely represents either the sequela of migraine headaches or minimal chronic microvascular ischemic change.  None of the foci appear to be acute and there are no new foci compared to the 07/27/2018 MRI. 2.    There are no acute findings. INTERPRETING PHYSICIAN: Richard A. Epimenio Foot, MD, PhD, FAAN Certified in  Neuroimaging by [employer]     EKG Interpretation  Date/Time:  Tuesday September 22 2019 05:32:41 EST Ventricular Rate:  84 PR Interval:    QRS Duration: 62 QT Interval:  363 QTC Calculation: 430 R Axis:   76 Text Interpretation: Sinus rhythm Low voltage, precordial leads Borderline repolarization abnormality No old tracing to compare Confirmed by Melene Plan [phone]) on 09/22/2019 7:30:33 AM       MDM   Patient care handoff report received from [facility], PA-C. Plan: Administer second IV fluid bolus.  Reassess patient.  Total observation 4 to 6 hours.  TTS consult, dispo per their recommendation once medically cleared.   Patient presents following intentional overdose on Ambien.  During my time with the patient, and she was alert and oriented.  Even when sleeping, she was able to be easily woken.  She experienced some episodes of blood pressure into the  90s systolic without changes in mental status.  This responded well to IV fluids. She was evaluated by psych team and recommended for inpatient management.  She is medically cleared.   Vitals:   09/22/19 0430 09/22/19 0500 09/22/19 0530 09/22/19 0632  BP: 98/77 101/75 109/82 98/62  Pulse: 84 89 86 85  Resp: 19 17 (!) 24 15  Temp:      TempSrc:      SpO2: 100% 100% 100% 100%   Vitals:   09/22/19 0700 09/22/19 0730 09/22/19 0820 09/22/19 0821  BP: 96/63  111/79 106/79   Pulse: 80 89  94  Resp: 16 16    Temp:      TempSrc:      SpO2: 100% 100%  100%      Lorayne Bender, PA-C 09/22/19 [place], Star, DO 09/23/19 [phone]

## 2019-09-22 NOTE — ED Notes (Signed)
This RN spoke with behavioral health staff. Patient will be transferred to inpatient [facility].

## 2019-09-23 ENCOUNTER — Ambulatory Visit: Payer: BC Managed Care – PPO | Admitting: Physical Therapy

## 2019-09-23 DIAGNOSIS — F329 Major depressive disorder, single episode, unspecified: Principal | ICD-10-CM

## 2019-09-23 LAB — TSH: TSH: 0.566 u[IU]/mL (ref 0.350–4.500)

## 2019-09-23 MED ORDER — BUPROPION HCL ER (XL) 150 MG PO TB24: 150.0000 mg | ORAL_TABLET | Freq: Every day | ORAL | Status: DC

## 2019-09-23 MED ORDER — MONTELUKAST SODIUM 10 MG PO TABS
10.0000 mg | ORAL_TABLET | Freq: Every day | ORAL | Status: DC
  Administered 2019-09-23: 10 mg via ORAL
  Filled 2019-09-23 (×3): qty 1

## 2019-09-23 MED ORDER — LUBIPROSTONE 24 MCG PO CAPS
24.0000 ug | ORAL_CAPSULE | Freq: Two times a day (BID) | ORAL | Status: DC
  Administered 2019-09-23 – 2019-09-24 (×2): 24 ug via ORAL
  Filled 2019-09-23 (×6): qty 1

## 2019-09-23 MED ORDER — DULOXETINE HCL 60 MG PO CPEP
60.0000 mg | ORAL_CAPSULE | Freq: Every day | ORAL | Status: DC
  Administered 2019-09-23: 60 mg via ORAL
  Filled 2019-09-23 (×3): qty 1

## 2019-09-23 MED ORDER — POTASSIUM CHLORIDE CRYS ER 20 MEQ PO TBCR
20.0000 meq | EXTENDED_RELEASE_TABLET | Freq: Every day | ORAL | Status: DC
  Administered 2019-09-23: 20 meq via ORAL
  Filled 2019-09-23 (×4): qty 1

## 2019-09-23 MED ORDER — BUPROPION HCL ER (XL) 300 MG PO TB24
300.0000 mg | ORAL_TABLET | Freq: Every day | ORAL | Status: DC
  Administered 2019-09-24: 300 mg via ORAL
  Filled 2019-09-23 (×3): qty 1

## 2019-09-23 MED ORDER — TRAZODONE HCL 50 MG PO TABS: 50.0000 mg | ORAL_TABLET | Freq: Every evening | ORAL | Status: DC | PRN

## 2019-09-23 MED ORDER — PROPRANOLOL HCL 40 MG PO TABS
40.0000 mg | ORAL_TABLET | Freq: Every day | ORAL | Status: DC
  Administered 2019-09-23 – 2019-09-24 (×2): 40 mg via ORAL
  Filled 2019-09-23: qty 1
  Filled 2019-09-23: qty 4
  Filled 2019-09-23 (×3): qty 1

## 2019-09-23 NOTE — Tx Team (Signed)
Interdisciplinary Treatment and Diagnostic Plan Update  09/23/2019 Time of Session:  Charlene Ferguson MRN: [id number]  Principal Diagnosis: <principal problem not specified>  Secondary Diagnoses: Active Problems:   MDD (major depressive disorder)   Current Medications:  Current Facility-Administered Medications  Medication Dose Route Frequency Provider Last Rate Last Admin  . acetaminophen (TYLENOL) tablet 650 mg  650 mg Oral Q6H PRN Sharma Covert, MD      . albuterol (VENTOLIN HFA) 108 (90 Base) MCG/ACT inhaler 1-2 puff  1-2 puff Inhalation Q6H PRN Sharma Covert, MD      . ALPRAZolam Duanne Moron) tablet 0.25 mg  0.25 mg Oral BID PRN Sharma Covert, MD      . alum & mag hydroxide-simeth (MAALOX/MYLANTA) 200-200-20 MG/5ML suspension 30 mL  30 mL Oral Q4H PRN Sharma Covert, MD      . eletriptan (RELPAX) tablet 40 mg  40 mg Oral Q2H PRN Sharma Covert, MD      . hyoscyamine (LEVSIN SL) SL tablet 0.25 mg  0.25 mg Sublingual Q6H PRN Sharma Covert, MD      . lamoTRIgine (LAMICTAL) tablet 200 mg  200 mg Oral Daily Sharma Covert, MD   200 mg at 09/22/19 1818  . magnesium hydroxide (MILK OF MAGNESIA) suspension 30 mL  30 mL Oral Daily PRN Sharma Covert, MD      . mometasone (ELOCON) 0.1 % cream   Topical Daily Sharma Covert, MD      . OLANZapine zydis [facility]) disintegrating tablet 5 mg  5 mg Oral QHS Sharma Covert, MD   5 mg at 09/22/19 2200  . polyethylene glycol (MIRALAX / GLYCOLAX) packet 17 g  17 g Oral Daily PRN Sharma Covert, MD      . pregabalin (LYRICA) capsule 100 mg  100 mg Oral BID Sharma Covert, MD   100 mg at 09/22/19 1818  . promethazine (PHENERGAN) tablet 12.5 mg  12.5 mg Oral Q6H PRN Sharma Covert, MD   12.5 mg at 09/22/19 1819  . Vitamin D (Ergocalciferol) (DRISDOL) capsule 50,000 Units  50,000 Units Oral Q7 days Cobos, Myer Peer, MD       PTA Medications: Medications Prior to Admission  Medication Sig Dispense Refill  Last Dose  . albuterol (PROVENTIL HFA;VENTOLIN HFA) 108 (90 Base) MCG/ACT inhaler Inhale 2 puffs into the lungs every 6 (six) hours as needed for wheezing or shortness of breath.     . amphetamine-dextroamphetamine (ADDERALL XR) 10 MG 24 hr capsule Take 10 mg by mouth daily as needed (adhd).      Marland Kitchen buPROPion (WELLBUTRIN XL) 300 MG 24 hr tablet Take 300 mg by mouth daily.     . Cholecalciferol (D3 MAXIMUM STRENGTH) 5000 units capsule Take 5,000 Units by mouth once a week.     . diclofenac (CATAFLAM) 50 MG tablet Take 1 tablet (50 mg total) by mouth 2 (two) times daily as needed. (Patient taking differently: Take 50 mg by mouth 2 (two) times daily as needed (pain). ) 30 tablet 1   . DULoxetine (CYMBALTA) 60 MG capsule Take 60 mg by mouth daily.     Marland Kitchen eletriptan (RELPAX) 40 MG tablet Take 1 tablet (40 mg total) by mouth as needed for migraine or headache. May repeat in 2 hours if headache persists or recurs. 10 tablet 3   . Fremanezumab-vfrm (AJOVY) 225 MG/1.5ML SOAJ Inject 225 mg into the skin every 30 (thirty) days. Inject 225 mg every month  3 pen 4   . lamoTRIgine (LAMICTAL) 200 MG tablet Take 200 mg by mouth daily.      Marland Kitchen lubiprostone (AMITIZA) 24 MCG capsule Take 24 mcg by mouth 2 (two) times daily with a meal.     . montelukast (SINGULAIR) 10 MG tablet Take 10 mg by mouth at bedtime.     . norethindrone-ethinyl estradiol (CYCLAFEM,ALYACEN) 0.5/0.75/1-35 MG-MCG tablet Take 1 tablet by mouth daily.     . ondansetron (ZOFRAN-ODT) 8 MG disintegrating tablet Take 8 mg by mouth every 8 (eight) hours as needed for nausea or vomiting.     . pregabalin (LYRICA) 100 MG capsule Take 100 mg by mouth 2 (two) times daily.     . propranolol (INDERAL) 20 MG tablet Take 2 tablets (40 mg total) by mouth 2 (two) times daily. 120 tablet 6   . SUMAtriptan (IMITREX) 6 MG/0.5ML SOLN injection Inject 0.5 mLs (6 mg total) into the skin every 2 (two) hours as needed for migraine or headache. May repeat in 2 hours if  headache persists or recurs. (Patient not taking: Reported on 08/17/2019) 5 mL 11   . tiZANidine (ZANAFLEX) 4 MG tablet Take 1 tablet (4 mg total) by mouth every 6 (six) hours as needed for muscle spasms. 30 tablet 0   . triamcinolone (NASACORT ALLERGY 24HR) 55 MCG/ACT AERO nasal inhaler Place 2 sprays into the nose daily.     Marland Kitchen zolpidem (AMBIEN) 5 MG tablet Take 5 mg by mouth at bedtime.       Patient Stressors: Health problems Loss of job  Patient Strengths: Ability for insight Average or above average intelligence Communication skills Supportive family/friends  Treatment Modalities: Medication Management, Group therapy, Case management,  1 to 1 session with clinician, Psychoeducation, Recreational therapy.   Physician Treatment Plan for Primary Diagnosis: <principal problem not specified> Long Term Goal(s):     Short Term Goals:    Medication Management: Evaluate patient's response, side effects, and tolerance of medication regimen.  Therapeutic Interventions: 1 to 1 sessions, Unit Group sessions and Medication administration.  Evaluation of Outcomes: Not Met  Physician Treatment Plan for Secondary Diagnosis: Active Problems:   MDD (major depressive disorder)  Long Term Goal(s):     Short Term Goals:       Medication Management: Evaluate patient's response, side effects, and tolerance of medication regimen.  Therapeutic Interventions: 1 to 1 sessions, Unit Group sessions and Medication administration.  Evaluation of Outcomes: Not Met   RN Treatment Plan for Primary Diagnosis: <principal problem not specified> Long Term Goal(s): Knowledge of disease and therapeutic regimen to maintain health will improve  Short Term Goals: Ability to participate in decision making will improve, Ability to verbalize feelings will improve, Ability to disclose and discuss suicidal ideas, Ability to identify and develop effective coping behaviors will improve and Compliance with prescribed  medications will improve  Medication Management: RN will administer medications as ordered by provider, will assess and evaluate patient's response and provide education to patient for prescribed medication. RN will report any adverse and/or side effects to prescribing provider.  Therapeutic Interventions: 1 on 1 counseling sessions, Psychoeducation, Medication administration, Evaluate responses to treatment, Monitor vital signs and CBGs as ordered, Perform/monitor CIWA, COWS, AIMS and Fall Risk screenings as ordered, Perform wound care treatments as ordered.  Evaluation of Outcomes: Not Met   LCSW Treatment Plan for Primary Diagnosis: <principal problem not specified> Long Term Goal(s): Safe transition to appropriate next level of care at discharge, Engage patient in therapeutic  group addressing interpersonal concerns.  Short Term Goals: Engage patient in aftercare planning with referrals and resources  Therapeutic Interventions: Assess for all discharge needs, 1 to 1 time with Social worker, Explore available resources and support systems, Assess for adequacy in community support network, Educate family and significant other(s) on suicide prevention, Complete Psychosocial Assessment, Interpersonal group therapy.  Evaluation of Outcomes: Not Met   Progress in Treatment: Attending groups: No. Participating in groups: No. Taking medication as prescribed: Yes. Toleration medication: Yes. Family/Significant other contact made: No, will contact:  if patient consents to collateral contacts Patient understands diagnosis: Yes. Discussing patient identified problems/goals with staff: Yes. Medical problems stabilized or resolved: Yes. Denies suicidal/homicidal ideation: Yes. Issues/concerns per patient self-inventory: No. Other:   New problem(s) identified: None   New Short Term/Long Term Goal(s): Detox, medication stabilization, elimination of SI thoughts, development of comprehensive  mental wellness plan.    Patient Goals:    Discharge Plan or Barriers: Patient recently admitted. CSW will continue to follow and assess for appropriate referrals and possible discharge planning.    Reason for Continuation of Hospitalization: Anxiety Depression Medication stabilization Suicidal ideation  Estimated Length of Stay: 3-5 days   Attendees: Patient: 09/23/2019 8:59 AM  Physician: Dr. Myles Lipps, MD 09/23/2019 8:59 AM  Nursing: Sharl Ma.Viona Gilmore, RN 09/23/2019 8:59 AM  RN Care Manager: 09/23/2019 8:59 AM  Social Worker: Radonna Ricker, LCSW 09/23/2019 8:59 AM  Recreational Therapist:  09/23/2019 8:59 AM  Other:  09/23/2019 8:59 AM  Other:  09/23/2019 8:59 AM  Other: 09/23/2019 8:59 AM    Scribe for Treatment Team: Marylee Floras, [place] 09/23/2019 8:59 AM

## 2019-09-23 NOTE — [facility] Suicide Risk Assessment (Signed)
[facility] Admission Suicide Risk Assessment   Nursing information obtained from:    Demographic factors:  Caucasian Current Mental Status:  Suicidal ideation indicated by patient(currently no SI noted.) Loss Factors:  NA Historical Factors:  Impulsivity Risk Reduction Factors:  Responsible for children under 33 years of age, Sense of responsibility to family, Living with another person, especially a relative, Positive social support, Positive therapeutic relationship, Positive coping skills or problem solving skills  Total Time spent with patient: 30 minutes Principal Problem: <principal problem not specified> Diagnosis:  Active Problems:   MDD (major depressive disorder)  Subjective Data: Patient is seen and examined.  Patient is a 33 year old female with a reported past psychiatric history significant for depression and anxiety.  The patient originally presented to the [facility] emergency department on 12/29 after intentionally ingesting 6 Ambien tablets.  The patient stated at that time that she has a longstanding history of bowel obstructions and multiple surgeries as well as migraine headaches.  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  She also lives with her 7-year-old child and boyfriend.  She stated she is on multiple medications, and that her boyfriend generally administers her medication.  On the night prior to admission he was working, so she picked up the Ambien prescription.  She stated this a.m. that she had not overdosed, and that she was not trying to harm her self.  She stated she sees a psychiatrist (Dr. Jacqulyn Bath) and that he recently changed her medications.  She stated she was taking Wellbutrin, and felt as though that it was helping her moods.  She stated that her depression is always looking in the background.  The majority of this information was collected from  the electronic medical record.  She was having difficulty putting together her story this morning except for the fact that she stated that she had not overdosed, and this was not a suicide attempt.  She was transferred from the [facility] emergency department to our facility for continued evaluation and stabilization.  She is not very clear on her medications most probably secondary to the fact that her boyfriend handles most of this.  She did state that she is on the Zyprexa for her chronic nausea.  Her medical and psychiatric medicines from admission were continued on admission including Xanax, Relpax, Levsin, Lamictal, Elocon, Zyprexa, Lyrica, Phenergan and vitamin D.  Continued Clinical Symptoms:  Alcohol Use Disorder Identification Test Final Score (AUDIT): 0 The "Alcohol Use Disorders Identification Test", Guidelines for Use in Primary Care, Second Edition.  World Science writer [facility]). Score between 0-7:  no or low risk or alcohol related problems. Score between 8-15:  moderate risk of alcohol related problems. Score between 16-19:  high risk of alcohol related problems. Score 20 or above:  warrants further diagnostic evaluation for alcohol dependence and treatment.   CLINICAL FACTORS:   Severe Anxiety and/or Agitation Depression:   Anhedonia Impulsivity Insomnia More than one psychiatric diagnosis Previous Psychiatric Diagnoses and Treatments Medical Diagnoses and Treatments/Surgeries   Musculoskeletal: Strength & Muscle Tone: within normal limits Gait & Station: normal Patient leans: N/A  Psychiatric Specialty Exam: Physical Exam  Nursing note and vitals reviewed. Constitutional: She is oriented to person, place, and time. She appears well-developed and well-nourished.  HENT:  Head: Normocephalic and atraumatic.  Respiratory: Effort normal.  Neurological: She is alert and oriented to person, place, and  time.    Review of Systems  Blood pressure 104/73, pulse 88,  temperature 98.6 F (37 C), temperature source Oral, resp. rate 20, height 4\' 11"  (1.499 m), weight 52.2 kg, SpO2 100 %.Body mass index is 23.23 kg/m.  General Appearance: Casual  Eye Contact:  Fair  Speech:  Normal Rate  Volume:  Decreased  Mood:  Anxious  Affect:  Congruent  Thought Process:  Goal Directed and Descriptions of Associations: Circumstantial  Orientation:  Full (Time, Place, and Person)  Thought Content:  Logical  Suicidal Thoughts:  No  Homicidal Thoughts:  No  Memory:  Immediate;   Fair Recent;   Fair Remote;   Fair  Judgement:  Intact  Insight:  Fair  Psychomotor Activity:  Normal  Concentration:  Concentration: Fair and Attention Span: Fair  Recall:  [employer] of Knowledge:  Fair  Language:  Good  Akathisia:  Negative  Handed:  Right  AIMS (if indicated):     Assets:  Desire for Improvement Resilience  ADL's:  Intact  Cognition:  WNL  Sleep:         COGNITIVE FEATURES THAT CONTRIBUTE TO RISK:  None    SUICIDE RISK:   Minimal: No identifiable suicidal ideation.  Patients presenting with no risk factors but with morbid ruminations; may be classified as minimal risk based on the severity of the depressive symptoms  PLAN OF CARE: Patient is seen and examined.  Patient is a 33 year old female with the above-stated past medical and psychiatric history was admitted after what was thought to be an intentional overdose of Ambien.  She will be admitted to the hospital.  She will be integrated into the milieu.  She will be encouraged to attend groups.  We will continue her psychiatric medications as previously written until we get additional collateral information.  We will also attempt to contact Dr. Laverta Baltimore to make sure about what medication she is taking and why she is taking those medications.  I wills start her Wellbutrin back, and will start at 150 mg p.o. daily of the Wellbutrin long-acting.  Review of her laboratories showed normal electrolytes, normal CBC,  negative pregnancy test, negative blood alcohol, salicylate and acetaminophen.  Her drug screen was positive for amphetamines and barbiturates.  She is prescribed amphetamines, and the barbiturates may be related to some migraine headache medication.  She recently had an MRI which showed nonspecific punctate hyperintense foci in the subcortical or deep white matter.  It was a nonspecific finding representing the sequela of migraine headaches are minimal chronic microvascular ischemia.  No other findings were found.  Review of her lab results showed no previous assessment of her thyroid function.  I will order that for this afternoon for completeness sake.  I certify that inpatient services furnished can reasonably be expected to improve the patient's condition.   Sharma Covert, MD 09/23/2019, 11:18 AM

## 2019-09-23 NOTE — Progress Notes (Signed)
Recreation Therapy Notes  Date:  12.30.20 Time: 0930 Location: [address] Dayroom  Group Topic: Stress Management  Goal Area(s) Addresses:  Patient will identify positive stress management techniques. Patient will identify benefits of using stress management post d/c.  Intervention: Stress Management  Activity :  Guided Imagery.  LRT read a script that guided patients on a journey to their peaceful place.  Patients were to envision all of the things their peaceful place has to offer.  Patients were to listen and follow along as script was read.  Education:  Stress Management, Discharge Planning.   Education Outcome: Acknowledges Education  Clinical Observations/Feedback: Pt did not attend group activity.    Victorino Sparrow, LRT/CTRS   Victorino Sparrow A 09/23/2019 10:58 AM
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
## 2019-09-23 NOTE — Progress Notes (Signed)
   09/23/19 0146  Psych Admission Type (Psych Patients Only)  Admission Status Voluntary  Psychosocial Assessment  Patient Complaints Anxiety  Eye Contact Brief  Facial Expression Sad  Affect Anxious  Speech Logical/coherent  Interaction Needy  Motor Activity Slow  Appearance/Hygiene Unremarkable  Behavior Characteristics Anxious  Mood Anxious  Thought Process  Coherency Concrete thinking  Content Blaming self  Delusions WDL  Perception WDL  Hallucination None reported or observed  Judgment Impaired  Confusion None  Danger to Self  Current suicidal ideation? Denies  Danger to Others  Danger to Others None reported or observed  D: sleeping most of the evening. When asked about her medication she takes at home. Pt insist her boyfriend takes care of her medication because she can't remember due to the many meds taken.  A: Medications administered as prescribed. Support and encouragement provided as needed.  R: Patient remains safe on the unit. Will continue to monitor for safety and stability.

## 2019-09-23 NOTE — Progress Notes (Signed)
D: Pt visible in dayroom with peers. Minimal but appropriate interactions observed with peers and staff. Presents with flat /sad affect and depressed mood. Denies SI/ HI/ AVH and pain when assessed. Preoccupied about current admission status and discharge "I just want to know if I'm voluntary or not because I was not trying to kill myself, I was just trying to sleep but my boyfriend said I told him that I took 5 of my Ambien. I just want to talk to the doctor and go home". Pt is pleasant and cooperative with care thus far.  A: Emotional support and availability offered to pt throughout this shift. Scheduled medications administered with verbal education and effects monitored. Pt encouraged to attend unit groups and other scheduled activities. Q 15 minutes safety checks maintained without self harm gestures or outburst.  R: Pt receptive to care. Denies concerns at this time. Compliant with medications when offered. Tolerated all PO intake well. Remains safe on and off unit.

## 2019-09-24 ENCOUNTER — Ambulatory Visit: Payer: BC Managed Care – PPO | Admitting: Physical Therapy

## 2019-09-24 DIAGNOSIS — F332 Major depressive disorder, recurrent severe without psychotic features: Secondary | ICD-10-CM

## 2019-09-24 MED ORDER — PROPRANOLOL HCL 40 MG PO TABS: 40.0000 mg | ORAL_TABLET | Freq: Every day | ORAL | 1 refills | Status: AC

## 2019-09-24 MED ORDER — TRAZODONE HCL 50 MG PO TABS: 50.0000 mg | ORAL_TABLET | Freq: Every evening | ORAL | 1 refills | Status: DC | PRN

## 2019-09-24 MED ORDER — OLANZAPINE 5 MG PO TBDP: 5.0000 mg | ORAL_TABLET | Freq: Every day | ORAL | 1 refills | Status: DC

## 2019-09-24 NOTE — Discharge Summary (Addendum)
Physician Discharge Summary Note  Patient:  Charlene Ferguson is an 33 y.o., female MRN:  [id number] DOB:  07/11/1986 Patient phone:  [phone] (home)  Patient address:   [address] [place] [id number],  Total Time spent with patient: 30 minutes  Date of Admission:  09/22/2019 Date of Discharge: 09/24/19  Reason for Admission:  33 year old woman with history of IBS, bowel obstructions, migraines, fibromyalgia, and depression, presenting after reported overdose on unknown number of Ambien.   Principal Problem: <principal problem not specified> Discharge Diagnoses: Active Problems:   MDD (major depressive disorder)   Past Psychiatric History: History of depression. Current patient of Dr. Jacqulyn BathLong. Denies history of suicide attempts, hospitalizations, mania or psychosis.  Past Medical History:  Past Medical History:  Diagnosis Date  . Anxiety   . Depression   . Fibromyalgia   . Heart murmur   . IBS (irritable bowel syndrome)   . Kidney stones   . Migraine   . Motion sickness    car back seat  . Neuropathy   . Ovarian cyst 2010   bilateral side. R cyst has been removed 2016  . SBO (small bowel obstruction) (HCC)    2010  . 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 ABDOMINAL SURGERY    . NASAL SEPTOPLASTY W/ TURBINOPLASTY Bilateral 11/21/2017   Procedure: NASAL SEPTOPLASTY WITH TURBINATE REDUCTION;  Surgeon: Vernie MurdersJuengel, Paul, MD;  [facility];  Service: ENT;  Laterality: Bilateral;  . OVARIAN CYST SURGERY    . SMALL INTESTINE SURGERY     at birth to remove volvulus   Family History:  Family History  Problem Relation Age of Onset  . Prostate cancer Neg Hx   . Kidney disease Neg Hx   . Kidney cancer Neg Hx    Family Psychiatric  History: Mother with unspecified mental health issues. Social History:  Social History   Substance and Sexual Activity   Alcohol Use No     Social History   Substance and Sexual Activity  Drug Use No    Social History   Socioeconomic History  . Marital status: Single    Spouse name: Not on file  . Number of children: 1  . Years of education: BA  . Highest education level: Not on file  Occupational History  . Occupation: [employer]   Tobacco Use  . Smoking status: Never Smoker  . Smokeless tobacco: Never Used  Substance and Sexual Activity  . Alcohol use: No  . Drug use: No  . Sexual activity: Yes    Birth control/protection: Pill  Other Topics Concern  . Not on file  Social History Narrative   Lives with child   Caffeine use: daily   Social Determinants of Health   Financial Resource Strain:   . Difficulty of Paying Living Expenses: Not on file  Food Insecurity:   . Worried About Programme researcher, broadcasting/film/videounning Out of Food in the Last Year: Not on file  . Ran Out of Food in the Last Year: Not on file  Transportation Needs:   . Lack of Transportation (Medical): Not on file  . Lack of Transportation (Non-Medical): Not on file  Physical Activity:   . Days of Exercise per Week: Not on file  . Minutes of Exercise per Session: Not on file  Stress:   . Feeling of Stress : Not on  file  Social Connections:   . Frequency of Communication with Friends and Family: Not on file  . Frequency of Social Gatherings with Friends and Family: Not on file  . Attends Religious Services: Not on file  . Active Member of Clubs or Organizations: Not on file  . Attends Banker Meetings: Not on file  . Marital Status: Not on file    Hospital Course:  Patient remained on the [facility] unit for 1 days. The patient stabilized on medication and therapy.  Patient significant other was contacted for collateral information.  Patient's significant other, Fayrene Fearing, reports that the patient did only take 3 Ambien.  He states that when she told him where the other 2 pills were he did find them and they were in her purse.   He states that he feels safe with her discharging home with him today and that he does not feel that she has been a threat to herself.  He stated that he was hoping that the Ambien would be discontinued as the patient shows some bizarre behavior as well as confusion after taking the medication in the past and feels that it would be best that the patient was not on the Ambien anymore.  He reports that he has contacted her psychiatrist to discuss discontinuation of the Ambien as well.  Patient was discharged on Xanax 0.25 mg p.o. twice daily as needed for anxiety, Wellbutrin XL 300 mg daily, Cymbalta 60 mg p.o. daily, Relpax 40 mg p.o every 2 hours as needed for migraines, Lamictal 200 mg p.o. daily, Amitiza 24 mcg twice daily with meals, Singulair 10 mg p.o. nightly, Zyprexa 5 mg p.o. nightly, Lyrica 100 mg p.o. twice daily, trazodone 50 mg p.o. nightly as needed, propranolol 40 mg p.o. daily, Singulair 10 mg p.o. nightly, Klor-Con 20 mEq p.o. daily. Patient has shown improvement with improved mood, affect, sleep, appetite, and interaction. Patient has been seen interacting with peers and staff appropriately. Patient has been seen in the day room interacting with peers and staff appropriately. Patient denies any SI/HI/AVH and contracts for safety. Patient agrees to follow up Dr. Jacqulyn Bath for her psychiatry needs. Patient is provided with prescriptions for their medications upon discharge.  Physical Findings: AIMS: Facial and Oral Movements Muscles of Facial Expression: None, normal Lips and Perioral Area: None, normal Jaw: None, normal Tongue: None, normal,Extremity Movements Upper (arms, wrists, hands, fingers): None, normal Lower (legs, knees, ankles, toes): None, normal, Trunk Movements Neck, shoulders, hips: None, normal, Overall Severity Severity of abnormal movements (highest score from questions above): None, normal Incapacitation due to abnormal movements: None, normal Patient's awareness of abnormal  movements (rate only patient's report): No Awareness, Dental Status Current problems with teeth and/or dentures?: No Does patient usually wear dentures?: No  CIWA:    COWS:     Musculoskeletal: Strength & Muscle Tone: within normal limits Gait & Station: normal Patient leans: N/A  Psychiatric Specialty Exam: Physical Exam  Nursing note and vitals reviewed. Constitutional: She is oriented to person, place, and time. She appears well-developed and well-nourished.  Cardiovascular: Normal rate.  Respiratory: Effort normal.  Musculoskeletal:        General: Normal range of motion.  Neurological: She is alert and oriented to person, place, and time.  Skin: Skin is warm.    Review of Systems  Constitutional: Negative.   HENT: Negative.   Eyes: Negative.   Respiratory: Negative.   Cardiovascular: Negative.   Gastrointestinal: Negative.   Genitourinary: Negative.   Musculoskeletal:  Negative.   Skin: Negative.   Neurological: Negative.   Psychiatric/Behavioral: Negative.     Blood pressure 103/74, pulse 92, temperature 98.6 F (37 C), temperature source Oral, resp. rate 20, height 4\' 11"  (1.499 m), weight 52.2 kg, SpO2 100 %.Body mass index is 23.23 kg/m.   General Appearance: Casual  Eye Contact::  Fair  Speech:  Normal Rate409  Volume:  Normal  Mood:  Euthymic  Affect:  Congruent  Thought Process:  Coherent and Descriptions of Associations: Intact  Orientation:  Full (Time, Place, and Person)  Thought Content:  Logical  Suicidal Thoughts:  No  Homicidal Thoughts:  No  Memory:  Immediate;   Fair Recent;   Fair Remote;   Fair  Judgement:  Intact  Insight:  Fair  Psychomotor Activity:  Normal  Concentration:  Fair  Recall:  Good  Fund of Knowledge:Good  Language: Good  Akathisia:  Negative  Handed:  Right  AIMS (if indicated):     Assets:  Desire for Improvement Financial Resources/Insurance Housing Resilience Social Support Transportation  Sleep:  Number of  Hours: 6.75  Cognition: WNL  ADL's:  Intact      Has this patient used any form of tobacco in the last 30 days? (Cigarettes, Smokeless Tobacco, Cigars, and/or Pipes)  No  Blood Alcohol level:  Lab Results  Component Value Date   ETH <10 23/76/2831    Metabolic Disorder Labs:  No results found for: HGBA1C, MPG No results found for: PROLACTIN No results found for: CHOL, TRIG, HDL, CHOLHDL, VLDL, LDLCALC  See Psychiatric Specialty Exam and Suicide Risk Assessment completed by Attending Physician prior to discharge.  Discharge destination:  Home  Is patient on multiple antipsychotic therapies at discharge:  No   Has Patient had three or more failed trials of antipsychotic monotherapy by history:  No  Recommended Plan for Multiple Antipsychotic Therapies: NA   Allergies as of 09/24/2019      Reactions   Ceftriaxone Hives   Morphine And Related Nausea And Vomiting, Rash      Medication List    STOP taking these medications   amphetamine-dextroamphetamine 10 MG 24 hr capsule Commonly known as: ADDERALL XR   D3 Maximum Strength 125 MCG (5000 UT) capsule Generic drug: Cholecalciferol   Nasacort Allergy 24HR 55 MCG/ACT Aero nasal inhaler Generic drug: triamcinolone   ondansetron 8 MG disintegrating tablet Commonly known as: ZOFRAN-ODT   SUMAtriptan 6 MG/0.5ML Soln injection Commonly known as: Imitrex   tiZANidine 4 MG tablet Commonly known as: Zanaflex   zolpidem 5 MG tablet Commonly known as: AMBIEN     TAKE these medications     Indication  Ajovy 225 MG/1.5ML Soaj Generic drug: Fremanezumab-vfrm Inject 225 mg into the skin every 30 (thirty) days. Inject 225 mg every month  Indication: Migraine Headache   albuterol 108 (90 Base) MCG/ACT inhaler Commonly known as: VENTOLIN HFA Inhale 2 puffs into the lungs every 6 (six) hours as needed for wheezing or shortness of breath.  Indication: Asthma   buPROPion 300 MG 24 hr tablet Commonly known as: WELLBUTRIN  XL Take 300 mg by mouth daily.  Indication: Attention Deficit Hyperactivity Disorder   diclofenac 50 MG tablet Commonly known as: CATAFLAM Take 1 tablet (50 mg total) by mouth 2 (two) times daily as needed. What changed: reasons to take this  Indication: Pain   DULoxetine 60 MG capsule Commonly known as: CYMBALTA Take 60 mg by mouth daily.  Indication: Major Depressive Disorder   eletriptan 40 MG  tablet Commonly known as: Relpax Take 1 tablet (40 mg total) by mouth as needed for migraine or headache. May repeat in 2 hours if headache persists or recurs.  Indication: Migraine Headache   lamoTRIgine 200 MG tablet Commonly known as: LAMICTAL Take 200 mg by mouth daily.  Indication: Manic-Depression   lubiprostone 24 MCG capsule Commonly known as: AMITIZA Take 24 mcg by mouth 2 (two) times daily with a meal.  Indication: Chronic Constipation of Unknown Cause   montelukast 10 MG tablet Commonly known as: SINGULAIR Take 10 mg by mouth at bedtime.  Indication: Asthma   norethindrone-ethinyl estradiol 0.5/0.75/1-35 MG-MCG tablet Commonly known as: CYCLAFEM Take 1 tablet by mouth daily.  Indication: Birth Control Treatment   OLANZapine zydis 5 MG disintegrating tablet Commonly known as: ZYPREXA Take 1 tablet (5 mg total) by mouth at bedtime.  Indication: Major Depressive Disorder   pregabalin 100 MG capsule Commonly known as: LYRICA Take 100 mg by mouth 2 (two) times daily.  Indication: Neuropathic Pain   propranolol 40 MG tablet Commonly known as: INDERAL Take 1 tablet (40 mg total) by mouth daily. Start taking on: September 25, 2019 What changed:   medication strength  when to take this  Indication: Migraine Headache, Per PCP   traZODone 50 MG tablet Commonly known as: DESYREL Take 1 tablet (50 mg total) by mouth at bedtime as needed and may repeat dose one time if needed for sleep.  Indication: Trouble Sleeping        Follow-up recommendations:  Continue  activity as tolerated. Continue diet as recommended by your PCP. Ensure to keep all appointments with outpatient providers.  Comments:  Patient is instructed prior to discharge to: Take all medications as prescribed by his/her mental healthcare provider. Report any adverse effects and or reactions from the medicines to his/her outpatient provider promptly. Patient has been instructed & cautioned: To not engage in alcohol and or illegal drug use while on prescription medicines. In the event of worsening symptoms, patient is instructed to call the crisis hotline, 911 and or go to the nearest ED for appropriate evaluation and treatment of symptoms. To follow-up with his/her primary care provider for your other medical issues, concerns and or health care needs.    Signed: Gerlene Burdock Money, FNP 09/24/2019, 11:15 AM
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
## 2019-09-24 NOTE — Progress Notes (Signed)
  [facility] Adult Case Management Discharge Plan :  Will you be returning to the same living situation after discharge:  Yes,  patient reports she is returning home with her boyfriend At discharge, do you have transportation home?: Yes,  patient's boyfriend is picking her up Do you have the ability to pay for your medications: Yes,  BCBS  Release of information consent forms completed and in the chart;  Patient's signature needed at discharge.  Patient to Follow up at: Follow-up Information    Long, Sherrye Payor, MD. Go on 09/30/2019.   Specialty: Psychiatry Why: Your next appointment with your psychiatrist is Wednesday, 09/30/2019 at 10:30 am with Dr, Acquanetta Chain, MD. Please be sure to bring any discharge paperwork from this hospitalization, including your list of medications.  Contact information: [address] [place] [address] [phone]           Next level of care provider has access to [address] and Suicide Prevention discussed: Yes,  with the patient's boyfriend     Has patient been referred to the Quitline?: N/A patient is not a smoker  Patient has been referred for addiction treatment: N/A  Marylee Floras, [place] 09/24/2019, 12:29 PM

## 2019-09-24 NOTE — BHH Suicide Risk Assessment (Signed)
[place] INPATIENT:  Family/Significant Other Suicide Prevention Education  Suicide Prevention Education:  Education Completed; with Jiles Crocker [phone]) has been identified by the patient as the family member/significant other with whom the patient will be residing, and identified as the person(s) who will aid the patient in the event of a mental health crisis (suicidal ideations/suicide attempt).  With written consent from the patient, the family member/significant other has been provided the following suicide prevention education, prior to the and/or following the discharge of the patient.  The suicide prevention education provided includes the following:  Suicide risk factors  Suicide prevention and interventions  National Suicide Hotline telephone number  [facility] assessment telephone number  [facility] Emergency Assistance [place] and/or Residential Mobile Crisis Unit telephone number  Request made of family/significant other to:  Remove weapons (e.g., guns, rifles, knives), all items previously/currently identified as safety concern.    Remove drugs/medications (over-the-counter, prescriptions, illicit drugs), all items previously/currently identified as a safety concern.  The family member/significant other verbalizes understanding of the suicide prevention education information provided.  The family member/significant other agrees to remove the items of safety concern listed above.  Jeneen Rinks reports he does not have any questions or concerns regarding the patient's discharge. Jeneen Rinks reports he is picking the patient up.    Marylee Floras 09/24/2019, 12:00 PM

## 2019-09-24 NOTE — [facility] Suicide Risk Assessment (Signed)
[facility] Discharge Suicide Risk Assessment   Principal Problem: <principal problem not specified> Discharge Diagnoses: Active Problems:   MDD (major depressive disorder)   Total Time spent with patient: 15 minutes  Musculoskeletal: Strength & Muscle Tone: within normal limits Gait & Station: normal Patient leans: N/A  Psychiatric Specialty Exam: Review of Systems  All other systems reviewed and are negative.   Blood pressure 103/74, pulse 92, temperature 98.6 F (37 C), temperature source Oral, resp. rate 20, height 4\' 11"  (1.499 m), weight 52.2 kg, SpO2 100 %.Body mass index is 23.23 kg/m.  General Appearance: Casual  Eye Contact::  Fair  Speech:  Normal Rate409  Volume:  Normal  Mood:  Euthymic  Affect:  Congruent  Thought Process:  Coherent and Descriptions of Associations: Intact  Orientation:  Full (Time, Place, and Person)  Thought Content:  Logical  Suicidal Thoughts:  No  Homicidal Thoughts:  No  Memory:  Immediate;   Fair Recent;   Fair Remote;   Fair  Judgement:  Intact  Insight:  Fair  Psychomotor Activity:  Normal  Concentration:  Fair  Recall:  Good  Fund of Knowledge:Good  Language: Good  Akathisia:  Negative  Handed:  Right  AIMS (if indicated):     Assets:  Desire for Improvement Financial Resources/Insurance Housing Resilience Social Support Transportation  Sleep:  Number of Hours: 6.75  Cognition: WNL  ADL's:  Intact   Mental Status Per Nursing Assessment::   On Admission:  Suicidal ideation indicated by patient(currently no SI noted.)  Demographic Factors:  Caucasian  Loss Factors: NA  Historical Factors: Impulsivity  Risk Reduction Factors:   Living with another person, especially a relative, Positive social support, Positive therapeutic relationship and Positive coping skills or problem solving skills  Continued Clinical Symptoms:  Bipolar Disorder:   Mixed State  Cognitive Features That Contribute To Risk:  None    Suicide  Risk:  Minimal: No identifiable suicidal ideation.  Patients presenting with no risk factors but with morbid ruminations; may be classified as minimal risk based on the severity of the depressive symptoms    Plan Of Care/Follow-up recommendations:  Activity:  ad lib  Sharma Covert, MD 09/24/2019, 9:39 AM

## 2019-09-24 NOTE — Plan of Care (Signed)
Discharge note  Patient verbalizes readiness for discharge. Follow up plan explained, AVS, Transition record and SRA given. Prescriptions and teaching provided. Belongings returned and signed for. Suicide safety plan completed and signed. Patient verbalizes understanding. Patient denies SI/HI and assures this Probation officer they will seek assistance should that change. Patient discharged to lobby where spouse was waiting.  Problem: Education: Goal: Utilization of techniques to improve thought processes will improve Outcome: Adequate for Discharge Goal: Knowledge of the prescribed therapeutic regimen will improve Outcome: Adequate for Discharge   Problem: Activity: Goal: Interest or engagement in leisure activities will improve Outcome: Adequate for Discharge Goal: Imbalance in normal sleep/wake cycle will improve Outcome: Adequate for Discharge   Problem: Coping: Goal: Coping ability will improve Outcome: Adequate for Discharge Goal: Will verbalize feelings Outcome: Adequate for Discharge   Problem: Health Behavior/Discharge Planning: Goal: Ability to make decisions will improve Outcome: Adequate for Discharge Goal: Compliance with therapeutic regimen will improve Outcome: Adequate for Discharge   Problem: Role Relationship: Goal: Will demonstrate positive changes in social behaviors and relationships Outcome: Adequate for Discharge   Problem: Safety: Goal: Ability to disclose and discuss suicidal ideas will improve Outcome: Adequate for Discharge Goal: Ability to identify and utilize support systems that promote safety will improve Outcome: Adequate for Discharge   Problem: Self-Concept: Goal: Will verbalize positive feelings about self Outcome: Adequate for Discharge Goal: Level of anxiety will decrease Outcome: Adequate for Discharge   Problem: Education: Goal: Knowledge of  General Education information/materials will improve Outcome: Adequate for Discharge Goal:  Emotional status will improve Outcome: Adequate for Discharge Goal: Mental status will improve Outcome: Adequate for Discharge Goal: Verbalization of understanding the information provided will improve Outcome: Adequate for Discharge   Problem: Activity: Goal: Interest or engagement in activities will improve Outcome: Adequate for Discharge Goal: Sleeping patterns will improve Outcome: Adequate for Discharge   Problem: Coping: Goal: Ability to verbalize frustrations and anger appropriately will improve Outcome: Adequate for Discharge Goal: Ability to demonstrate self-control will improve Outcome: Adequate for Discharge   Problem: Health Behavior/Discharge Planning: Goal: Identification of resources available to assist in meeting health care needs will improve Outcome: Adequate for Discharge Goal: Compliance with treatment plan for underlying cause of condition will improve Outcome: Adequate for Discharge   Problem: Physical Regulation: Goal: Ability to maintain clinical measurements within normal limits will improve Outcome: Adequate for Discharge   Problem: Safety: Goal: Periods of time without injury will increase Outcome: Adequate for Discharge   Problem: Education: Goal: Ability to state activities that reduce stress will improve Outcome: Adequate for Discharge   Problem: Coping: Goal: Ability to identify and develop effective coping behavior will improve Outcome: Adequate for Discharge   Problem: Self-Concept: Goal: Ability to identify factors that promote anxiety will improve Outcome: Adequate for Discharge Goal: Level of anxiety will decrease Outcome: Adequate for Discharge Goal: Ability to modify response to factors that promote anxiety will improve Outcome: Adequate for Discharge

## 2019-09-24 NOTE — Progress Notes (Signed)
   09/24/19 0002  Psych Admission Type (Psych Patients Only)  Admission Status Voluntary  Psychosocial Assessment  Patient Complaints Anxiety  Eye Contact Fair  Facial Expression Flat;Sad  Affect Appropriate to circumstance  Speech Logical/coherent;Soft  Interaction Needy  Motor Activity Slow  Appearance/Hygiene Unremarkable  Behavior Characteristics Anxious  Mood Depressed;Anxious  Thought Process  Coherency Circumstantial  Content Blaming self;Blaming others  Delusions None reported or observed  Perception WDL  Hallucination None reported or observed  Judgment Limited  Confusion None  Danger to Self  Current suicidal ideation? Denies  Danger to Others  Danger to Others None reported or observed  D: Patient in dayroom reports she had a good day. Pt goal is to write down her needs and to talk to the provider tomorrow. A: Medications administered as prescribed. Support and encouragement provided as needed.  R: Patient remains safe on the unit. Will continue to monitor for safety and stability.

## 2019-09-29 DIAGNOSIS — R300 Dysuria: Secondary | ICD-10-CM | POA: Diagnosis not present

## 2019-10-08 ENCOUNTER — Telehealth: Payer: Self-pay | Admitting: Neurology

## 2019-10-08 NOTE — Telephone Encounter (Signed)
[place] [facility], migrain CGRP form completed, signed and fax confirmation received [phone].

## 2019-10-08 NOTE — Telephone Encounter (Signed)
Pt has called to report that she now has Medicaid, pt has called asking that Medicaid be called about her need of Fremanezumab-vfrm (AJOVY) 225 MG/1.5ML SOAJ and the approval needed.

## 2019-10-08 NOTE — Telephone Encounter (Signed)
I called pt and she did not have the medicaid card available.  I call her pharmacy Recipient ID [id number]. Will initiate [place] tracks for ajovy.  She is due now for her injection.

## 2019-10-13 ENCOUNTER — Other Ambulatory Visit: Payer: Self-pay | Admitting: Neurology

## 2019-10-14 NOTE — Telephone Encounter (Signed)
Received approval for Charlene Ferguson # [id number] F thru 10-02-2020. [place] [facility].

## 2019-10-19 DIAGNOSIS — Z20822 Contact with and (suspected) exposure to covid-19: Secondary | ICD-10-CM | POA: Diagnosis not present

## 2019-10-19 DIAGNOSIS — Z01812 Encounter for preprocedural laboratory examination: Secondary | ICD-10-CM | POA: Diagnosis not present

## 2019-10-22 DIAGNOSIS — G8929 Other chronic pain: Secondary | ICD-10-CM | POA: Diagnosis not present

## 2019-10-22 DIAGNOSIS — R1013 Epigastric pain: Secondary | ICD-10-CM | POA: Diagnosis not present

## 2019-10-27 DIAGNOSIS — R300 Dysuria: Secondary | ICD-10-CM | POA: Diagnosis not present

## 2019-10-27 DIAGNOSIS — R1032 Left lower quadrant pain: Secondary | ICD-10-CM | POA: Diagnosis not present

## 2019-11-01 ENCOUNTER — Encounter: Payer: Self-pay | Admitting: Neurology

## 2019-11-02 ENCOUNTER — Other Ambulatory Visit: Payer: Self-pay | Admitting: Neurology

## 2019-11-10 DIAGNOSIS — H527 Unspecified disorder of refraction: Secondary | ICD-10-CM | POA: Diagnosis not present

## 2019-11-10 DIAGNOSIS — Z0100 Encounter for examination of eyes and vision without abnormal findings: Secondary | ICD-10-CM | POA: Diagnosis not present

## 2019-11-17 DIAGNOSIS — H543 Unqualified visual loss, both eyes: Secondary | ICD-10-CM | POA: Diagnosis not present

## 2019-11-25 NOTE — Progress Notes (Signed)
I have reviewed and agreed above plan. 

## 2019-11-26 DIAGNOSIS — R1032 Left lower quadrant pain: Secondary | ICD-10-CM | POA: Diagnosis not present

## 2019-11-26 DIAGNOSIS — M6289 Other specified disorders of muscle: Secondary | ICD-10-CM | POA: Diagnosis not present

## 2019-11-26 DIAGNOSIS — K5900 Constipation, unspecified: Secondary | ICD-10-CM | POA: Diagnosis not present

## 2019-11-26 DIAGNOSIS — Z6822 Body mass index (BMI) 22.0-22.9, adult: Secondary | ICD-10-CM | POA: Diagnosis not present

## 2019-12-11 ENCOUNTER — Encounter: Payer: Self-pay | Admitting: Neurology

## 2019-12-31 ENCOUNTER — Encounter: Payer: Self-pay | Admitting: Neurology

## 2019-12-31 ENCOUNTER — Other Ambulatory Visit: Payer: Self-pay

## 2019-12-31 ENCOUNTER — Ambulatory Visit: Payer: Medicaid Other | Admitting: Neurology

## 2019-12-31 VITALS — BP 102/69 | HR 78 | Temp 97.4°F | Ht 59.0 in | Wt 108.5 lb

## 2019-12-31 DIAGNOSIS — IMO0002 Reserved for concepts with insufficient information to code with codable children: Secondary | ICD-10-CM

## 2019-12-31 DIAGNOSIS — G43709 Chronic migraine without aura, not intractable, without status migrainosus: Secondary | ICD-10-CM

## 2019-12-31 DIAGNOSIS — Z79899 Other long term (current) drug therapy: Secondary | ICD-10-CM

## 2019-12-31 NOTE — Progress Notes (Signed)
PATIENT: Charlene Ferguson DOB: 09-02-1986  Chief Complaint  Patient presents with  . Migraine    She is doing well on Ajovy. She estimates having one migraine per week. She uses Relpax and lays down to rest in a dark room with her severe headaches.      HISTORICAL Charlene Ferguson a 34 years old female, seen in request byher primary care physician [name], [name],for evaluation of chronic migraine, initial evaluation was on July 08, 2018.  I have reviewed and summarized the referring note from the referring physician.She had a past medical history of kidney stone, cardiac murmur.  She reported a history of chronic migraine in her 68s, gradually getting worse, now she is having migraine headache every day, bilateral retro-orbital area severe pounding headache light noise sensitivity, blurry vision, she also complains of bilateral fingertips, feet paresthesia, whole body achy pain, was given the diagnosis of fibromyalgia, she works as a Emergency planning/management officer, it is hard for her to work through her job, complains of memory loss, even difficulty writing her own name, I personally reviewed CT head without contrast in July 2017 that was normal Right laboratory evaluations in July 2019 showed normal CBC hemoglobin of 12.8, normal CMP, creatinine of 0.66, INR of 1.02,  UPDATE December 31 2019:  I reviewed hospital admission on December 29-31 2020, she was over dosed on unknown number of Ambien,  I personally reviewed MRI of the brain without contrast in December 2020, and also November 2019, there was no significant abnormality  Laboratory evaluation in December 2020, normal TSH, UDS was positive for amphetamine, and barbiturate, normal CMP, CBC, salicylate level was less than 7,  Patient has been followed up closely by her psychiatrist, was diagnosed with schizoaffective disorder, is currently on polypharmacy treatment, this include Wellbutrin, Zyprexa, Cymbalta, lamotrigine, Adderall, Xanax  as needed, she continue complains of frequent panic attack, auditory and visual hallucinations.  Her migraine headaches has much improved, especially since she was started on Ajovy 225 mcg every month as preventive medication in December 2020, she rarely have to take Relpax, taking Fioricet occasionally  REVIEW OF SYSTEMS: Full 14 system review of systems performed and notable only for as above All other review of systems were negative.  ALLERGIES: Allergies  Allergen Reactions  . Ceftriaxone Hives  . Morphine And Related Nausea And Vomiting and Rash    HOME MEDICATIONS: Current Outpatient Medications  Medication Sig Dispense Refill  . albuterol (PROVENTIL HFA;VENTOLIN HFA) 108 (90 Base) MCG/ACT inhaler Inhale 2 puffs into the lungs every 6 (six) hours as needed for wheezing or shortness of breath.    . ALPRAZolam (XANAX) 0.25 MG tablet Take 0.25 mg by mouth 2 (two) times daily as needed for anxiety.    Marland Kitchen amphetamine-dextroamphetamine (ADDERALL) 30 MG tablet Take 30 mg by mouth 2 (two) times daily.    Marland Kitchen buPROPion (WELLBUTRIN XL) 300 MG 24 hr tablet Take 300 mg by mouth daily.    . butalbital-acetaminophen-caffeine (FIORICET) 50-325-40 MG tablet Take 1 tablet by mouth daily as needed for headache.    . diclofenac (CATAFLAM) 50 MG tablet Take 1 tablet (50 mg total) by mouth 2 (two) times daily as needed. (Patient taking differently: Take 50 mg by mouth 2 (two) times daily as needed (pain). ) 30 tablet 1  . DULoxetine (CYMBALTA) 60 MG capsule Take 60 mg by mouth daily.    Marland Kitchen eletriptan (RELPAX) 40 MG tablet Take 1 tablet (40 mg total) by mouth as needed for migraine  or headache. May repeat in 2 hours if headache persists or recurs. 10 tablet 3  . Fremanezumab-vfrm (AJOVY) 225 MG/1.5ML SOAJ Inject 225 mg into the skin every 30 (thirty) days. Inject 225 mg every month 3 pen 4  . HYOSCYAMINE SULFATE PO Take 0.125 mg by mouth daily as needed.    Marland Kitchen ketorolac (TORADOL) 10 MG tablet Take 10 mg by  mouth daily as needed for moderate pain (back pain).    Marland Kitchen lamoTRIgine (LAMICTAL) 200 MG tablet Take 200 mg by mouth daily.     Marland Kitchen lubiprostone (AMITIZA) 24 MCG capsule Take 24 mcg by mouth 2 (two) times daily with a meal.    . montelukast (SINGULAIR) 10 MG tablet Take 10 mg by mouth at bedtime.    . norethindrone-ethinyl estradiol (CYCLAFEM,ALYACEN) 0.5/0.75/1-35 MG-MCG tablet Take 1 tablet by mouth daily.    Marland Kitchen OLANZapine zydis (ZYPREXA) 5 MG disintegrating tablet Take 1 tablet (5 mg total) by mouth at bedtime. 30 tablet 1  . polyethylene glycol (MIRALAX / GLYCOLAX) 17 g packet Take 17 g by mouth daily.    Marland Kitchen POTASSIUM PO Take 10 mEq by mouth daily as needed.    . pregabalin (LYRICA) 100 MG capsule Take 100 mg by mouth 2 (two) times daily.    Marland Kitchen PROMETHAZINE HCL PO Take 1 tablet by mouth daily as needed.    . propranolol (INDERAL) 40 MG tablet Take 1 tablet (40 mg total) by mouth daily. 30 tablet 1  . traZODone (DESYREL) 50 MG tablet Take 1 tablet (50 mg total) by mouth at bedtime as needed and may repeat dose one time if needed for sleep. 30 tablet 1   No current facility-administered medications for this visit.    PAST MEDICAL HISTORY: Past Medical History:  Diagnosis Date  . Anxiety   . Depression   . Fibromyalgia   . Heart murmur   . IBS (irritable bowel syndrome)   . Kidney stones   . Migraine   . Motion sickness    car back seat  . Neuropathy   . Ovarian cyst 2010   bilateral side. R cyst has been removed 2016  . SBO (small bowel obstruction) (HCC)    2010  . 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 ABDOMINAL SURGERY    . NASAL SEPTOPLASTY W/ TURBINOPLASTY Bilateral 11/21/2017   Procedure: NASAL SEPTOPLASTY WITH TURBINATE REDUCTION;  Surgeon: Vernie Murders, MD;  Location: [facility];  Service: ENT;  Laterality: Bilateral;  . OVARIAN CYST  SURGERY    . SMALL INTESTINE SURGERY     at birth to remove volvulus    FAMILY HISTORY: Family History  Problem Relation Age of Onset  . Prostate cancer Neg Hx   . Kidney disease Neg Hx   . Kidney cancer Neg Hx     SOCIAL HISTORY: Social History   Socioeconomic History  . Marital status: Single    Spouse name: Not on file  . Number of children: 1  . Years of education: BA  . Highest education level: Not on file  Occupational History  . Occupation: [employer]   Tobacco Use  . Smoking status: Never Smoker  . Smokeless tobacco: Never Used  Substance and Sexual Activity  . Alcohol use: No  . Drug use: No  . Sexual activity: Yes    Birth control/protection:  Pill  Other Topics Concern  . Not on file  Social History Narrative   Lives with child   Caffeine use: daily   Social Determinants of Health   Financial Resource Strain:   . Difficulty of Paying Living Expenses:   Food Insecurity:   . Worried About Charity fundraiser in the Last Year:   . Arboriculturist in the Last Year:   Transportation Needs:   . Film/video editor (Medical):   Marland Kitchen Lack of Transportation (Non-Medical):   Physical Activity:   . Days of Exercise per Week:   . Minutes of Exercise per Session:   Stress:   . Feeling of Stress :   Social Connections:   . Frequency of Communication with Friends and Family:   . Frequency of Social Gatherings with Friends and Family:   . Attends Religious Services:   . Active Member of Clubs or Organizations:   . Attends Archivist Meetings:   Marland Kitchen Marital Status:   Intimate Partner Violence:   . Fear of Current or Ex-Partner:   . Emotionally Abused:   Marland Kitchen Physically Abused:   . Sexually Abused:      PHYSICAL EXAM   Vitals:   12/31/19 1006  BP: 102/69  Pulse: 78  Temp: (!) 97.4 F (36.3 C)  Weight: 108 lb 8 oz (49.2 kg)  Height: 4\' 11"  (1.499 m)    Not recorded      Body mass index is 21.91 kg/m.  PHYSICAL  EXAMNIATION:  Gen: NAD, conversant, well nourised, well groomed                     Cardiovascular: Regular rate rhythm, no peripheral edema, warm, nontender. Eyes: Conjunctivae clear without exudates or hemorrhage Neck: Supple, no carotid bruits. Pulmonary: Clear to auscultation bilaterally   NEUROLOGICAL EXAM:  MENTAL STATUS: Speech:    Speech is normal; fluent and spontaneous with normal comprehension.  Cognition:     Orientation to time, place and person     Normal recent and remote memory     Normal Attention span and concentration     Normal Language, naming, repeating,spontaneous speech     Fund of knowledge   CRANIAL NERVES: CN II: Visual fields are full to confrontation. Pupils are round equal and briskly reactive to light. CN III, IV, VI: extraocular movement are normal. No ptosis. CN V: Facial sensation is intact to light touch CN VII: Face is symmetric with normal eye closure  CN VIII: Hearing is normal to causal conversation. CN IX, X: Phonation is normal. CN XI: Head turning and shoulder shrug are intact  MOTOR: There is no pronator drift of out-stretched arms. Muscle bulk and tone are normal. Muscle strength is normal.  REFLEXES: Reflexes are 2+ and symmetric at the biceps, triceps, knees, and ankles. Plantar responses are flexor.  SENSORY: Intact to light touch, pinprick and vibratory sensation are intact in fingers and toes.  COORDINATION: There is no trunk or limb dysmetria noted.  GAIT/STANCE: Posture is normal. Gait is steady with normal steps, base, arm swing, and turning. Heel and toe walking are normal. Tandem gait is normal.  Romberg is absent.   DIAGNOSTIC DATA (LABS, IMAGING, TESTING) - I reviewed patient records, labs, notes, testing and imaging myself where available.   ASSESSMENT AND PLAN  Charlene Ferguson is a 34 y.o. female   Chronic migraine headaches Mood disorder, with polypharmacy  Continue Ajovy, propanolol 40 mg twice a day  as  migraine prevention,  Relpax as needed  Return to clinic as needed, continue follow-up with her psychiatrist and primary care physician  Levert Feinstein, M.D. Ph.D.  [facility] [address] [place], [place] [address] Ph: [phone] Fax: [phone]  CC: Referring Provider

## 2020-01-07 ENCOUNTER — Encounter: Payer: Self-pay | Admitting: Physical Therapy

## 2020-01-07 ENCOUNTER — Other Ambulatory Visit: Payer: Self-pay

## 2020-01-07 ENCOUNTER — Ambulatory Visit: Payer: Medicaid Other | Attending: Internal Medicine | Admitting: Physical Therapy

## 2020-01-07 DIAGNOSIS — M6281 Muscle weakness (generalized): Secondary | ICD-10-CM | POA: Diagnosis not present

## 2020-01-07 DIAGNOSIS — N3000 Acute cystitis without hematuria: Secondary | ICD-10-CM | POA: Diagnosis not present

## 2020-01-07 DIAGNOSIS — Z6821 Body mass index (BMI) 21.0-21.9, adult: Secondary | ICD-10-CM | POA: Diagnosis not present

## 2020-01-07 DIAGNOSIS — R1084 Generalized abdominal pain: Secondary | ICD-10-CM

## 2020-01-07 DIAGNOSIS — R293 Abnormal posture: Secondary | ICD-10-CM

## 2020-01-07 DIAGNOSIS — R278 Other lack of coordination: Secondary | ICD-10-CM

## 2020-01-07 DIAGNOSIS — M545 Low back pain, unspecified: Secondary | ICD-10-CM

## 2020-01-07 DIAGNOSIS — R252 Cramp and spasm: Secondary | ICD-10-CM

## 2020-01-07 NOTE — Patient Instructions (Signed)
Guided Meditation for Pelvic Floor Relaxation  [employer]    [facility] [address], [address] [place], [place] [address] Phone # [phone] Fax [phone]  

## 2020-01-07 NOTE — Therapy (Signed)
[facility] [address] [place], [place], [address] Phone: [phone]   Fax:  [phone]  Physical Therapy Evaluation  Patient Details  Name: Charlene Ferguson MRN: [id number] Date of Birth: 1986/01/01 Referring Provider (PT): Dr. Jolayne Panther. Cotton; Dr. Dimple Nanas   Encounter Date: 01/07/2020  PT End of Session - 01/07/20 0947    Visit Number  1    Date for PT Re-Evaluation  03/31/20    Authorization Type  Medicaid    PT Start Time  0800    PT Stop Time  0840    PT Time Calculation (min)  40 min    Activity Tolerance  Patient tolerated treatment well;Patient limited by pain    Behavior During Therapy  [facility] for tasks assessed/performed       Past Medical History:  Diagnosis Date  . Anxiety   . Depression   . Fibromyalgia   . Heart murmur   . IBS (irritable bowel syndrome)   . Kidney stones   . Migraine   . Motion sickness    car back seat  . Neuropathy   . Ovarian cyst 2010   bilateral side. R cyst has been removed 2016  . SBO (small bowel obstruction) ([place])    2010  . 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 ABDOMINAL SURGERY    . NASAL SEPTOPLASTY W/ TURBINOPLASTY Bilateral 11/21/2017   Procedure: NASAL SEPTOPLASTY WITH TURBINATE REDUCTION;  Surgeon: Margaretha Sheffield, MD;  Location: [place];  Service: ENT;  Laterality: Bilateral;  . OVARIAN CYST SURGERY    . SMALL INTESTINE SURGERY     at birth to remove volvulus    There were no vitals filed for this visit.   Subjective Assessment - 01/07/20 0806    Subjective  My medication will change today due to seeing my GI doctor. Having pelvic floor pain. Patient can feel the pain.    Patient Stated Goals  learn pelvic floor exercise, reduce the constipation, be more aware of the pelvic and abdominal area    Currently in Pain?  Yes    Pain  Score  3     Pain Location  Pelvis    Pain Orientation  Mid    Pain Descriptors / Indicators  Tightness;Pounding    Pain Type  Chronic pain    Pain Onset  More than a month ago    Pain Frequency  Constant    Aggravating Factors   constipation; diarrhea; sitting    Pain Relieving Factors  TENS unit    Multiple Pain Sites  Yes    Pain Score  6    Pain Location  Abdomen    Pain Orientation  Anterior    Pain Descriptors / Indicators  Heaviness;Sharp   nerve pain   Pain Type  Chronic pain    Pain Onset  More than a month ago    Pain Frequency  Constant    Aggravating Factors   constipation; walking, sitting, breathing    Pain Relieving Factors  abdominal massage    Pain Score  4    Pain Location  Back    Pain Orientation  Right;Left    Pain Descriptors / Indicators  Cramping    Pain Type  Chronic pain    Pain Onset  More than a month ago  Pain Frequency  Constant    Aggravating Factors   increased pelvic floor pain, standing, lifting    Pain Relieving Factors  TENS unit         [facility] - 01/07[facility]20 0001      Assessment   Medical Diagnosis  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 Georga Hackingary C. Cotton; Dr. Misty Stanleyavid F. Ransohoff    Onset Date/Surgical Date  12/23/19    Prior Therapy  prior      Precautions   Precautions  None      Restrictions   Weight Bearing Restrictions  No      Balance Screen   Has the patient fallen in the past 6 months  No    Has the patient had a decrease in activity level because of a fear of falling?   No    Is the patient reluctant to leave their home because of a fear of falling?   No      Home Public house managernvironment   Living Environment  Private residence      Prior Function   Level of Independence  Independent      Cognition   Overall Cognitive Status  Within Functional Limits for tasks assessed      Posture/Postural Control   Posture/Postural  Control  Postural limitations    Postural Limitations  Rounded Shoulders;Forward head;Increased thoracic kyphosis      ROM / Strength   AROM / PROM / Strength  AROM;PROM;Strength      AROM   Lumbar Flexion  decreased by 25%    Lumbar Extension  full    Lumbar - Right Side Bend  pain    Lumbar - Left Side Bend  decreased by 50% with pain    Lumbar - Right Rotation  decreased by 50% with pain    Lumbar - Left Rotation  decreased by 75% with pain      PROM   Right Hip Flexion  120    Left Hip Flexion  120      Strength   Right Hip Flexion  3+/5    Right Hip Extension  3/5    Right Hip External Rotation   4/5    Right Hip Internal Rotation  4/5    Right Hip ABduction  3/5    Right Hip ADduction  3+/5    Left Hip Flexion  3+/5    Left Hip Extension  3/5    Left Hip External Rotation  4/5    Left Hip Internal Rotation  4/5    Left Hip ABduction  3/5    Left Hip ADduction  3/5      Palpation   SI assessment   right ilium anteriorly rotated;     Palpation comment  tenderness throughout the abdomen with most pain around the umbilicus and upper midline; Tenderness located in bil. buttocks and lumbar paraspinals      Special Tests    Special Tests  Sacrolliac Tests    Sacroiliac Tests   Pelvic Distraction      Pelvic Dictraction   Findings  Positive    Side   Right    Comment  increased pain                Objective measurements completed on examination: See above findings.    Pelvic Floor Special Questions - 01/07/20 0001    Currently Sexually Active  Yes    Is this Painful  No    Urinary Leakage  Yes    Activities that cause leaking  Coughing;Sneezing;Laughing    Urinary urgency  Yes    Urinary frequency  every 2 hours    Fecal incontinence  Yes   2 times per week with diarrhea, does feel it   Skin Integrity  Intact    External Palpation  bil. ishiocavernosus and left levator ani    Pelvic Floor Internal Exam  Patient confirms identification and approves  PT to assess pelvic floor and treatment    Exam Type  Vaginal    Palpation  tenderness located in bil. obturator internist and levator ani    Strength  Flicker               PT Education - 01/07/20 0839    Education Details  information on pelvic floor meditation; information on pelvic floor massage wand    Person(s) Educated  Patient    Methods  Explanation;Handout    Comprehension  Verbalized understanding       PT Short Term Goals - 01/07/20 0958      PT SHORT TERM GOAL #1   Title  Pt will show IND with HEP    Baseline  not educated yet    Time  4    Period  Weeks    Status  New    Target Date  02/04/20      PT SHORT TERM GOAL #2   Title  understand ways to manage pain with meditation    Baseline  not educated yet    Time  4    Period  Weeks    Status  New    Target Date  02/04/20      PT SHORT TERM GOAL #3   Title  Pt will demo increased diaphragmatic excursion and pelvic floor lengthening in order to promote improved toileting mechanics     Baseline  constipated and tight diaphragm    Time  4    Period  Weeks    Status  New    Target Date  02/04/20        PT Long Term Goals - 01/07/20 0959      PT LONG TERM GOAL #1   Title  independent wtih HEP and understand how to progress herself    Baseline  not edcuated yet    Time  12    Period  Weeks    Status  New    Target Date  03/31/20      PT LONG TERM GOAL #2   Title  able to bend forward fully to pick up items due to abdominal pain decreased >/= 50%    Baseline  abdominal pain 3/10    Time  12    Period  Weeks    Status  New    Target Date  03/31/20      PT LONG TERM GOAL #3   Title  able to walk for 15 minutes with fluid motion and no increase in pain so she is able to walk on her vacation    Baseline  3 minute walking due to increased pain    Time  12    Period  Weeks    Status  New    Target Date  03/31/20      PT LONG TERM GOAL #4   Title  able to relax her pelvic floor to have a  bowel movement  and reduce her constipation by 50%    Baseline  can be constipated for 3 weeks    Time  12    Period  Weeks    Status  New    Target Date  03/31/20      PT LONG TERM GOAL #5   Title  bilateral hip strength >/= 4/5 with minimal to no abdominal pain so patient is able to perform home tasks with greater ease    Baseline  strength is average of 3/5    Time  12    Period  Weeks    Status  New    Target Date  03/31/20             Plan - 01/07/20 0947    Clinical Impression Statement  Patient is a 34 year old female with chronic pelvic, abdominal, and back pain but last flare-up was in March. Patient back pain is 4/10 with standing and movement. Pelvic pain is 3/10 with constipation, sitting, standing and movement. Abdominal pain is 6/10 and is constant. Lumbar ROM is decreased with pain. Patient has decreased strength of bilateral hips averaging 3-4/10. Pelvic floor strength is 1/5. Palpable tenderness located in the abdomen with concentration in the umbilicus and upper midline. Tenderness in bilateral buttocks and lumbar paraspinals. Tenderness in the levator ani that refers into the buttocks, obturator internist, and ischiocavernosus. Patient reports urinary leakage with sneezing, coughing, and laughin. Patient right ilium is rotated anteriorly and with postive SI test for distraction on the right. Patient has had pelvic floor physical therapy in the past but had to stop due to medical reasons. Patient has a medical history including fibromyalgia, IBS, Bipolar 2 disorder, small bowel obstruction with resection and LOA. Patient will benefit from skilled therapy to improve overall strength, reduce her chronic pain in the back, abdomen, and pelvic floor and improve pelvic floor coordination.    Personal Factors and Comorbidities  Comorbidity 3+;Fitness    Comorbidities  fibromyalgia; small bowel obstruction s/p resection plus LOA; depression; bipolar 2 disorder, IBS     Examination-Activity Limitations  Continence;Toileting;Stand;Stairs;Lift;Squat;Sit;Bend;Bed Mobility;Locomotion Level    Examination-Participation Restrictions  Cleaning;Community Activity;Interpersonal Relationship;Laundry;Shop;Driving    Stability/Clinical Decision Making  Evolving/Moderate complexity    Clinical Decision Making  Moderate    Rehab Potential  Good    PT Frequency  1x / week   for first 3 weeks then possible 2 times per week for 9 weeks   PT Duration  12 weeks    PT Treatment/Interventions  Biofeedback;Cryotherapy;Electrical Stimulation;Moist Heat;Ultrasound;Neuromuscular re-education;Therapeutic exercise;Therapeutic activities;Patient/family education;Manual techniques;Dry needling;Spinal Manipulations    PT Next Visit Plan  vaginal massage, see if she  got the wand, work on stretching program, gentle cardio, soft tissue work vaginally    Financial planner with Plan of Care  Patient       Patient will benefit from skilled therapeutic intervention in order to improve the following deficits and impairments:  Decreased coordination, Decreased range of motion, Increased fascial restricitons, Pain, Decreased endurance, Increased muscle spasms, Decreased activity tolerance, Decreased strength, Decreased mobility  Visit Diagnosis: Muscle weakness (generalized) - Plan: PT plan of care cert/re-cert  Cramp and spasm - Plan: PT plan of care cert/re-cert  Abnormal posture - Plan: PT plan of care cert/re-cert  Acute midline low back pain without sciatica - Plan: PT plan of care cert/re-cert  Generalized abdominal pain - Plan: PT plan of care cert/re-cert  Other lack of coordination - Plan: PT plan of care cert/re-cert  Problem List Patient Active Problem List   Diagnosis Date Noted  . Polypharmacy 12/31/2019  . MDD (major depressive disorder) 09/22/2019  . Paresthesia 07/08/2018  . Chronic migraine 07/08/2018    Eulis Foster, PT 01/07/20 10:03 AM   [facility] [address], [address] [place], [place], [address] Phone: [phone]   Fax:  [phone]  Name: LASHAWNDA HANCOX MRN: [id number] Date of Birth: 01/03/1986

## 2020-01-13 ENCOUNTER — Encounter: Payer: Self-pay | Admitting: Physical Therapy

## 2020-01-13 ENCOUNTER — Other Ambulatory Visit: Payer: Self-pay

## 2020-01-13 ENCOUNTER — Ambulatory Visit: Payer: Medicaid Other | Admitting: Physical Therapy

## 2020-01-13 DIAGNOSIS — R278 Other lack of coordination: Secondary | ICD-10-CM

## 2020-01-13 DIAGNOSIS — R1084 Generalized abdominal pain: Secondary | ICD-10-CM

## 2020-01-13 DIAGNOSIS — M6281 Muscle weakness (generalized): Secondary | ICD-10-CM | POA: Diagnosis not present

## 2020-01-13 DIAGNOSIS — R252 Cramp and spasm: Secondary | ICD-10-CM | POA: Diagnosis not present

## 2020-01-13 DIAGNOSIS — M545 Low back pain, unspecified: Secondary | ICD-10-CM

## 2020-01-13 DIAGNOSIS — R293 Abnormal posture: Secondary | ICD-10-CM

## 2020-01-13 NOTE — Patient Instructions (Signed)
Access Code: [email or web address] URL: [email or web address] Date: 01/13/2020 Prepared by: Eulis Foster  Exercises Supine Single Knee to Chest Stretch - 1 x daily - 7 x weekly - 1 sets - 2 reps - 30 sec hold Supine Hamstring Stretch - 1 x daily - 7 x weekly - 1 sets - 2 reps - 30 sec hold Supine Lower Trunk Rotation - 1 x daily - 7 x weekly - 1 sets - 2 reps - 30 sec hold Supine Pelvic Floor Stretch - 1 x daily - 7 x weekly - 1 sets - 2 reps - 30 sec hold [facility] [address], [address] [place], [place] [address] Phone # [phone] Fax [phone]

## 2020-01-13 NOTE — Therapy (Signed)
[facility] [address], [address] [place], [place], [address] Phone: [phone]   Fax:  [phone]  Physical Therapy Treatment  Patient Details  Name: Charlene Ferguson MRN: [id number] Date of Birth: 1986/04/06 Referring Provider (PT): Dr. Theresia Lo. Cotton; Dr. Misty Stanley   Encounter Date: 01/13/2020  PT End of Session - 01/13/20 1012    Visit Number  2    Date for PT Re-Evaluation  03/31/20    Authorization Type  Medicaid    Authorization Time Period  4/21-5/11    PT Start Time  1011    PT Stop Time  1055    PT Time Calculation (min)  44 min    Activity Tolerance  Patient tolerated treatment well;Patient limited by pain    Behavior During Therapy  [facility] for tasks assessed/performed       Past Medical History:  Diagnosis Date  . Anxiety   . Depression   . Fibromyalgia   . Heart murmur   . IBS (irritable bowel syndrome)   . Kidney stones   . Migraine   . Motion sickness    car back seat  . Neuropathy   . Ovarian cyst 2010   bilateral side. R cyst has been removed 2016  . SBO (small bowel obstruction) ([facility])    2010  . 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 ABDOMINAL SURGERY    . NASAL SEPTOPLASTY W/ TURBINOPLASTY Bilateral 11/21/2017   Procedure: NASAL SEPTOPLASTY WITH TURBINATE REDUCTION;  Surgeon: Vernie Murders, MD;  Location: [facility];  Service: ENT;  Laterality: Bilateral;  . OVARIAN CYST SURGERY    . SMALL INTESTINE SURGERY     at birth to remove volvulus    There were no vitals filed for this visit.  Subjective Assessment - 01/13/20 1014    Subjective  I feel hot today.    Patient Stated Goals  learn pelvic floor exercise, reduce the constipation, be more aware of the pelvic and abdominal area    Currently in Pain?  Yes    Pain Score  3     Pain Location  Pelvis    Pain Orientation   Mid    Pain Descriptors / Indicators  Tightness;Pounding    Pain Type  Chronic pain    Pain Onset  More than a month ago    Pain Frequency  Constant    Aggravating Factors   constipation, diarrhea; sitting    Pain Relieving Factors  TENS unit    Multiple Pain Sites  Yes    Pain Score  4    Pain Location  Abdomen    Pain Orientation  Anterior    Pain Descriptors / Indicators  Heaviness;Sharp    Pain Type  Chronic pain    Pain Onset  More than a month ago    Pain Frequency  Constant    Aggravating Factors   constipation, walking, sitting, breathing    Pain Relieving Factors  abdominal massage    Pain Score  1    Pain Location  Back    Pain Orientation  Right;Left    Pain Descriptors / Indicators  Cramping    Pain Type  Chronic pain    Pain Onset  More than a month ago    Pain Frequency  Constant    Aggravating Factors  increased pelvic floor pain, standing, lifting    Pain Relieving Factors  TENS unit                       [facility] Adult PT Treatment/Exercise - 01/13/20 0001      Lumbar Exercises: Stretches   Active Hamstring Stretch  Right;Left;1 rep;30 seconds    Active Hamstring Stretch Limitations  supine    Single Knee to Chest Stretch  Right;Left;1 rep;30 seconds    Lower Trunk Rotation  2 reps;30 seconds    Lower Trunk Rotation Limitations  right left in supine    Quadruped Mid Back Stretch  1 rep;30 seconds    Quadruped Mid Back Stretch Limitations  tried childs pose with pillows but too much pressure in the left leg      Lumbar Exercises: Aerobic   Stationary Bike  level 0 5 min while assessing patient   tightness on the left     Manual Therapy   Manual Therapy  Myofascial release    Myofascial Release  fascial release to the upper thoracic diaphragm, respirtory diaphgram and urogential diaphgram going through the layers of fascia             PT Education - 01/13/20 1056    Education Details  Access Code: [email or web address]    Person(s) Educated   Patient    Methods  Explanation;Demonstration;Verbal cues;Handout    Comprehension  Verbalized understanding;Returned demonstration       PT Short Term Goals - 01/07/20 0958      PT SHORT TERM GOAL #1   Title  Pt will show IND with HEP    Baseline  not educated yet    Time  4    Period  Weeks    Status  New    Target Date  02/04/20      PT SHORT TERM GOAL #2   Title  understand ways to manage pain with meditation    Baseline  not educated yet    Time  4    Period  Weeks    Status  New    Target Date  02/04/20      PT SHORT TERM GOAL #3   Title  Pt will demo increased diaphragmatic excursion and pelvic floor lengthening in order to promote improved toileting mechanics     Baseline  constipated and tight diaphragm    Time  4    Period  Weeks    Status  New    Target Date  02/04/20        PT Long Term Goals - 01/07/20 0959      PT LONG TERM GOAL #1   Title  independent wtih HEP and understand how to progress herself    Baseline  not edcuated yet    Time  12    Period  Weeks    Status  New    Target Date  03/31/20      PT LONG TERM GOAL #2   Title  able to bend forward fully to pick up items due to abdominal pain decreased >/= 50%    Baseline  abdominal pain 3/10    Time  12    Period  Weeks    Status  New    Target Date  03/31/20      PT LONG TERM GOAL #3   Title  able to walk for 15 minutes with fluid motion and no increase in pain so she is able  to walk on her vacation    Baseline  3 minute walking due to increased pain    Time  12    Period  Weeks    Status  New    Target Date  03/31/20      PT LONG TERM GOAL #4   Title  able to relax her pelvic floor to have a bowel movement and reduce her constipation by 50%    Baseline  can be constipated for 3 weeks    Time  12    Period  Weeks    Status  New    Target Date  03/31/20      PT LONG TERM GOAL #5   Title  bilateral hip strength >/= 4/5 with minimal to no abdominal pain so patient is able to  perform home tasks with greater ease    Baseline  strength is average of 3/5    Time  12    Period  Weeks    Status  New    Target Date  03/31/20            Plan - 01/13/20 1019    Clinical Impression Statement  After manual work, patient right side of abdomen has less pain compared to the left side. Patient had more tightness and pain in left leg with recumbent bike and had to go slowly. Patient left leg is not as flexible as the right. Patient has fascial tightness in the trunk. Patient was not able to do childs pose due to pressure in the left leg. Patient is decondtioned and has difficulty with endurance. Patient will benefit from skilled therapy to improve overall strength, reduce her chronic pian in the back, abdomen, and pelvic floor and improve pelvic floor coordination.    Personal Factors and Comorbidities  Comorbidity 3+;Fitness    Comorbidities  fibromyalgia; small bowel obstruction s/p resection plus LOA; depression; bipolar 2 disorder, IBS    Examination-Activity Limitations  Continence;Toileting;Stand;Stairs;Lift;Squat;Sit;Bend;Bed Mobility;Locomotion Level    Examination-Participation Restrictions  Cleaning;Community Activity;Interpersonal Relationship;Laundry;Shop;Driving    Stability/Clinical Decision Making  Evolving/Moderate complexity    Rehab Potential  Good    PT Frequency  1x / week   first 3 weeks then 2 times per week for 9 weeks   PT Duration  12 weeks    PT Treatment/Interventions  Biofeedback;Cryotherapy;Electrical Stimulation;Moist Heat;Ultrasound;Neuromuscular re-education;Therapeutic exercise;Therapeutic activities;Patient/family education;Manual techniques;Dry needling;Spinal Manipulations    PT Next Visit Plan  vaginal massage, see if she  got the wand,  gentle cardio, soft tissue work vaginally, abdominal work    PT Home Exercise Plan  Access Code: [email or web address]    Consulted and Agree with Plan of Care  Patient       Patient will benefit from skilled  therapeutic intervention in order to improve the following deficits and impairments:  Decreased coordination, Decreased range of motion, Increased fascial restricitons, Pain, Decreased endurance, Increased muscle spasms, Decreased activity tolerance, Decreased strength, Decreased mobility  Visit Diagnosis: Muscle weakness (generalized)  Cramp and spasm  Abnormal posture  Acute midline low back pain without sciatica  Generalized abdominal pain  Other lack of coordination     Problem List Patient Active Problem List   Diagnosis Date Noted  . Polypharmacy 12/31/2019  . MDD (major depressive disorder) 09/22/2019  . Paresthesia 07/08/2018  . Chronic migraine 07/08/2018    Eulis Foster, PT 01/13/20 11:01 AM   [facility] [address] [place], [place], [address] Phone: [phone]   Fax:  [phone]  Name: LADAJAH SOLTYS MRN: [id number] Date of Birth: 07-08-1986

## 2020-01-18 DIAGNOSIS — E860 Dehydration: Secondary | ICD-10-CM | POA: Diagnosis not present

## 2020-01-26 ENCOUNTER — Other Ambulatory Visit: Payer: Self-pay

## 2020-01-26 ENCOUNTER — Encounter: Payer: Self-pay | Admitting: Physical Therapy

## 2020-01-26 ENCOUNTER — Ambulatory Visit: Payer: Medicaid Other | Attending: Internal Medicine | Admitting: Physical Therapy

## 2020-01-26 DIAGNOSIS — R278 Other lack of coordination: Secondary | ICD-10-CM | POA: Diagnosis present

## 2020-01-26 DIAGNOSIS — M6281 Muscle weakness (generalized): Secondary | ICD-10-CM | POA: Insufficient documentation

## 2020-01-26 DIAGNOSIS — R1084 Generalized abdominal pain: Secondary | ICD-10-CM | POA: Insufficient documentation

## 2020-01-26 DIAGNOSIS — M545 Low back pain, unspecified: Secondary | ICD-10-CM

## 2020-01-26 DIAGNOSIS — R293 Abnormal posture: Secondary | ICD-10-CM

## 2020-01-26 DIAGNOSIS — R252 Cramp and spasm: Secondary | ICD-10-CM | POA: Insufficient documentation

## 2020-01-26 NOTE — Patient Instructions (Signed)
Access Code: [email or web address] URL: [email or web address] Date: 01/13/2020 Prepared by: Eulis Foster  Exercises Supine Single Knee to Chest Stretch - 1 x daily - 7 x weekly - 1 sets - 2 reps - 30 sec hold Supine Hamstring Stretch - 1 x daily - 7 x weekly - 1 sets - 2 reps - 30 sec hold Supine Lower Trunk Rotation - 1 x daily - 7 x weekly - 1 sets - 2 reps - 30 sec hold Supine Pelvic Floor Stretch - 1 x daily - 7 x weekly - 1 sets - 2 reps - 30 sec hold Hooklying Rib Cage Breathing - 2 x daily - 7 x weekly - 1 sets - 10 reps [facility] [address], [address] [place], [place] [address] Phone # [phone] Fax [phone]

## 2020-01-26 NOTE — Therapy (Signed)
[facility] [address] [place], [place], [address] Phone: [phone]   Fax:  [phone]  Physical Therapy Treatment  Patient Details  Name: Charlene Ferguson MRN: [id number] Date of Birth: [birth year] Referring Provider (PT): Dr. Theresia Lo. Cotton; Dr. Misty Stanley   Encounter Date: 01/26/2020  PT End of Session - 01/26/20 1141    Visit Number  3    Date for PT Re-Evaluation  03/31/20    Authorization Type  Medicaid    Authorization Time Period  4/21-5/11    Authorization - Visit Number  2    Authorization - Number of Visits  3    PT Start Time  1100    PT Stop Time  1141    PT Time Calculation (min)  41 min    Activity Tolerance  Patient tolerated treatment well;Patient limited by pain    Behavior During Therapy  [facility] for tasks assessed/performed       Past Medical History:  Diagnosis Date  . Anxiety   . Depression   . Fibromyalgia   . Heart murmur   . IBS (irritable bowel syndrome)   . Kidney stones   . Migraine   . Motion sickness    car back seat  . Neuropathy   . Ovarian cyst 2010   bilateral side. R cyst has been removed 2016  . SBO (small bowel obstruction) (HCC)    2010  . 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 ABDOMINAL SURGERY    . NASAL SEPTOPLASTY W/ TURBINOPLASTY Bilateral 11/21/2017   Procedure: NASAL SEPTOPLASTY WITH TURBINATE REDUCTION;  Surgeon: Vernie Murders, MD;  Location: [facility];  Service: ENT;  Laterality: Bilateral;  . OVARIAN CYST SURGERY    . SMALL INTESTINE SURGERY     at birth to remove volvulus    There were no vitals filed for this visit.  Subjective Assessment - 01/26/20 1104    Subjective  I have been very sick and no bowel movements. I have to get sick daily. I am talking to my GI doctor. I am having dizzyness and trouble walkig straight.    Patient  Stated Goals  learn pelvic floor exercise, reduce the constipation, be more aware of the pelvic and abdominal area    Currently in Pain?  Yes    Pain Score  6     Pain Location  Pelvis    Pain Orientation  Mid    Pain Descriptors / Indicators  Tightness;Pounding    Pain Type  Chronic pain    Pain Onset  More than a month ago    Pain Frequency  Constant    Aggravating Factors   constipation, diarrhea, sitting    Pain Relieving Factors  TENS uit    Pain Score  6    Pain Location  Abdomen    Pain Orientation  Anterior    Pain Descriptors / Indicators  Heaviness;Sharp    Pain Onset  More than a month ago    Pain Frequency  Constant    Aggravating Factors   constipation, walking, sitting, breathing    Pain Relieving Factors  abdominal massage    Pain Score  4    Pain Location  Back    Pain Orientation  Right;Left    Pain Descriptors / Indicators  Cramping    Pain  Type  Chronic pain    Pain Onset  More than a month ago    Pain Frequency  Constant    Aggravating Factors   increased pelvic floor pain, standing, lifting    Pain Relieving Factors  TENS unit         [facility] PT Assessment - 01/26/20 0001      Assessment   Medical Diagnosis  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 Georga Hacking C. Cotton; Dr. Misty Stanley    Onset Date/Surgical Date  12/23/19    Prior Therapy  prior      Precautions   Precautions  None      Restrictions   Weight Bearing Restrictions  No      Home Environment   Living Environment  Private residence      Prior Function   Level of Independence  Independent      Cognition   Overall Cognitive Status  Within Functional Limits for tasks assessed      Posture/Postural Control   Posture/Postural Control  Postural limitations    Postural Limitations  Rounded Shoulders;Forward head;Increased thoracic kyphosis      AROM   Lumbar Flexion  decreased by 25%     Lumbar Extension  full    Lumbar - Right Side Bend  pain    Lumbar - Left Side Bend  decreased by 50% with pain    Lumbar - Right Rotation  decreased by 50% with pain    Lumbar - Left Rotation  decreased by 75% with pain      PROM   Right Hip Flexion  120    Left Hip Flexion  120      Strength   Right Hip Flexion  3+/5    Right Hip Extension  3/5    Right Hip External Rotation   4/5    Right Hip Internal Rotation  4/5    Right Hip ABduction  3/5    Right Hip ADduction  3+/5    Left Hip Flexion  3+/5    Left Hip Extension  3/5    Left Hip External Rotation  4/5    Left Hip Internal Rotation  4/5    Left Hip ABduction  3/5    Left Hip ADduction  3/5      Palpation   Palpation comment  tenderness throughout the abdomen with most pain around the umbilicus and upper midline; Tenderness located in bil. buttocks and lumbar paraspinals      Special Tests    Special Tests  Sacrolliac Tests    Sacroiliac Tests   Pelvic Distraction      Pelvic Dictraction   Findings  Positive    Side   Right    Comment  increased pain                   [facility] Adult PT Treatment/Exercise - 01/26/20 0001      Neuro Re-ed    Neuro Re-ed Details   breathing to open the rib cage with hands on the lateral rib cage and expand the back      Lumbar Exercises: Stretches   Quadruped Mid Back Stretch  1 rep;30 seconds    Quadruped Mid Back Stretch Limitations  childs pose    Piriformis Stretch  Right;Left;1 rep;30 seconds    Piriformis Stretch Limitations  pigeon pose    Other Lumbar Stretch Exercise  sitting trunk rotation bil. 30 sec      Lumbar Exercises: Aerobic   Stationary Bike  level 0 6 min while assessing patient      Manual Therapy   Manual Therapy  Myofascial release    Myofascial Release  fascial release of the respiratory diaphragm and urogenital ciaphragm, mesenteric root, lifting the large ntestines off the bladder, release of the sac of douglas             PT  Education - 01/26/20 1141    Education Details  Access Code: [email or web address]    Person(s) Educated  Patient    Methods  Explanation;Demonstration;Verbal cues;Handout    Comprehension  Verbalized understanding;Returned demonstration       PT Short Term Goals - 01/26/20 1114      PT SHORT TERM GOAL #1   Title  Pt will show IND with HEP    Baseline  still learning    Time  4    Period  Weeks    Status  On-going      PT SHORT TERM GOAL #2   Title  understand ways to manage pain with meditation    Time  4    Period  Weeks    Status  Achieved      PT SHORT TERM GOAL #3   Title  Pt will demo increased diaphragmatic excursion and pelvic floor lengthening in order to promote improved toileting mechanics     Baseline  constipated and tight diaphragm    Time  4    Period  Weeks    Status  On-going        PT Long Term Goals - 01/26/20 1115      PT LONG TERM GOAL #1   Title  independent wtih HEP and understand how to progress herself    Baseline  still learning new exercises as she progresses    Time  12    Period  Weeks    Status  On-going      PT LONG TERM GOAL #2   Title  able to bend forward fully to pick up items due to abdominal pain decreased >/= 50%    Baseline  abdominal pain 3/10    Time  12    Period  Weeks    Status  On-going      PT LONG TERM GOAL #3   Title  able to walk for 15 minutes with fluid motion and no increase in pain so she is able to walk on her vacation    Baseline  3 minute walking due to increased pain    Time  12    Period  Weeks    Status  On-going      PT LONG TERM GOAL #4   Title  able to relax her pelvic floor to have a bowel movement and reduce her constipation by 50%    Baseline  can be constipated for 3 weeks    Time  12    Period  Weeks    Status  On-going      PT LONG TERM GOAL #5   Title  bilateral hip strength >/= 4/5 with minimal to no abdominal pain so patient is able to perform home tasks with greater ease    Baseline  strength  is average of 3/5    Time  12    Period  Weeks    Status  On-going  Plan - 01/26/20 1118    Clinical Impression Statement  Patient was able to do 6 minutes on the bike compared to 5 minutes. Patient is doing her stretches and meditation. Patient is having trouble with constipation and is in increased pain level at 6/10. Patient had increased bowel sounds with the manual work. Patient has not had a bowel movement in 2 days and is using her medication to assist her. Patient reports she is having increased in dizzyness and trouble walking sinc her pain has increased. Patient has weakness of the hips and abdomen. Patient has trouble with expansion of the lower rib cage for diaphragmatic breathing. Patient will benefit from skilled therapy to imporve overall strength, reduce her chronic pain in the back, and pelvic floor relaxation and coordination.    Personal Factors and Comorbidities  Comorbidity 3+;Fitness    Comorbidities  fibromyalgia; small bowel obstruction s/p resection plus LOA; depression; bipolar 2 disorder, IBS    Examination-Activity Limitations  Continence;Toileting;Stand;Stairs;Lift;Squat;Sit;Bend;Bed Mobility;Locomotion Level    Examination-Participation Restrictions  Cleaning;Community Activity;Interpersonal Relationship;Laundry;Shop;Driving    Stability/Clinical Decision Making  Evolving/Moderate complexity    Rehab Potential  Good    PT Frequency  1x / week   first 3 weeks then 2 times per week for 9 weeks   PT Treatment/Interventions  Biofeedback;Cryotherapy;Electrical Stimulation;Moist Heat;Ultrasound;Neuromuscular re-education;Therapeutic exercise;Therapeutic activities;Patient/family education;Manual techniques;Dry needling;Spinal Manipulations    PT Next Visit Plan  vaginal massage, see if she  got the wand,  gentle cardio, soft tissue work vaginally, abdominal work    PT Home Exercise Plan  Access Code: [email or web address]    Consulted and Agree with Plan of Care  Patient        Patient will benefit from skilled therapeutic intervention in order to improve the following deficits and impairments:  Decreased coordination, Decreased range of motion, Increased fascial restricitons, Pain, Decreased endurance, Increased muscle spasms, Decreased activity tolerance, Decreased strength, Decreased mobility  Visit Diagnosis: Muscle weakness (generalized)  Cramp and spasm  Abnormal posture  Acute midline low back pain without sciatica  Generalized abdominal pain  Other lack of coordination     Problem List Patient Active Problem List   Diagnosis Date Noted  . Polypharmacy 12/31/2019  . MDD (major depressive disorder) 09/22/2019  . Paresthesia 07/08/2018  . Chronic migraine 07/08/2018    Earlie Counts, PT 01/26/20 11:47 AM    [facility] [address], [place], [place], [address] Phone: [phone]   Fax:  [phone]  Name: Charlene Ferguson MRN: [id number] Date of Birth: 02/22/1986

## 2020-02-04 ENCOUNTER — Ambulatory Visit: Payer: Medicaid Other | Admitting: Physical Therapy

## 2020-02-04 DIAGNOSIS — M6289 Other specified disorders of muscle: Secondary | ICD-10-CM | POA: Diagnosis not present

## 2020-02-04 DIAGNOSIS — Z6822 Body mass index (BMI) 22.0-22.9, adult: Secondary | ICD-10-CM | POA: Diagnosis not present

## 2020-02-05 DIAGNOSIS — R319 Hematuria, unspecified: Secondary | ICD-10-CM | POA: Diagnosis not present

## 2020-02-05 DIAGNOSIS — K5900 Constipation, unspecified: Secondary | ICD-10-CM | POA: Diagnosis not present

## 2020-02-05 DIAGNOSIS — N390 Urinary tract infection, site not specified: Secondary | ICD-10-CM | POA: Diagnosis not present

## 2020-02-05 DIAGNOSIS — J45909 Unspecified asthma, uncomplicated: Secondary | ICD-10-CM | POA: Diagnosis not present

## 2020-02-05 DIAGNOSIS — F419 Anxiety disorder, unspecified: Secondary | ICD-10-CM | POA: Diagnosis not present

## 2020-02-05 DIAGNOSIS — R1030 Lower abdominal pain, unspecified: Secondary | ICD-10-CM | POA: Diagnosis not present

## 2020-02-10 DIAGNOSIS — F411 Generalized anxiety disorder: Secondary | ICD-10-CM | POA: Diagnosis not present

## 2020-02-10 DIAGNOSIS — F5082 Avoidant/restrictive food intake disorder: Secondary | ICD-10-CM | POA: Diagnosis not present

## 2020-02-10 DIAGNOSIS — F4312 Post-traumatic stress disorder, chronic: Secondary | ICD-10-CM | POA: Diagnosis not present

## 2020-02-10 DIAGNOSIS — F410 Panic disorder [episodic paroxysmal anxiety] without agoraphobia: Secondary | ICD-10-CM | POA: Diagnosis not present

## 2020-02-11 ENCOUNTER — Other Ambulatory Visit: Payer: Self-pay

## 2020-02-11 ENCOUNTER — Encounter: Payer: Self-pay | Admitting: Physical Therapy

## 2020-02-11 ENCOUNTER — Ambulatory Visit: Payer: Medicaid Other | Admitting: Physical Therapy

## 2020-02-11 DIAGNOSIS — R278 Other lack of coordination: Secondary | ICD-10-CM

## 2020-02-11 DIAGNOSIS — R293 Abnormal posture: Secondary | ICD-10-CM

## 2020-02-11 DIAGNOSIS — M545 Low back pain, unspecified: Secondary | ICD-10-CM

## 2020-02-11 DIAGNOSIS — R1084 Generalized abdominal pain: Secondary | ICD-10-CM

## 2020-02-11 DIAGNOSIS — M6281 Muscle weakness (generalized): Secondary | ICD-10-CM | POA: Diagnosis not present

## 2020-02-11 DIAGNOSIS — R252 Cramp and spasm: Secondary | ICD-10-CM

## 2020-02-11 NOTE — Therapy (Addendum)
[facility] [address] [place], [place], [address] Phone: [phone]   Fax:  [phone]  Physical Therapy Treatment  Patient Details  Name: Charlene Ferguson MRN: [id number] Date of Birth: 08/07/1986 Referring Provider (PT): Dr. Jolayne Panther. Cotton; Dr. Dimple Nanas   Encounter Date: 02/11/2020  PT End of Session - 02/11/20 1611    Visit Number  4    Date for PT Re-Evaluation  03/31/20    Authorization Type  Medicaid    Authorization Time Period  02/04/2020-03/16/2020    Authorization - Visit Number  1    Authorization - Number of Visits  12    PT Start Time  4827    PT Stop Time  1610    PT Time Calculation (min)  40 min    Activity Tolerance  Patient tolerated treatment well    Behavior During Therapy  [facility] for tasks assessed/performed       Past Medical History:  Diagnosis Date  . Anxiety   . Depression   . Fibromyalgia   . Heart murmur   . IBS (irritable bowel syndrome)   . Kidney stones   . Migraine   . Motion sickness    car back seat  . Neuropathy   . Ovarian cyst 2010   bilateral side. R cyst has been removed 2016  . SBO (small bowel obstruction) ([place])    2010  . 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 ABDOMINAL SURGERY    . NASAL SEPTOPLASTY W/ TURBINOPLASTY Bilateral 11/21/2017   Procedure: NASAL SEPTOPLASTY WITH TURBINATE REDUCTION;  Surgeon: Margaretha Sheffield, MD;  Location: [facility];  Service: ENT;  Laterality: Bilateral;  . OVARIAN CYST SURGERY    . SMALL INTESTINE SURGERY     at birth to remove volvulus    There were no vitals filed for this visit.  Subjective Assessment - 02/11/20 1533    Subjective  I was sick so I was not able to come to therapy. I had to go to the hospital. I was blocked up.    Patient Stated Goals  learn pelvic floor exercise, reduce the constipation,  be more aware of the pelvic and abdominal area    Currently in Pain?  Yes    Pain Score  4     Pain Location  Pelvis    Pain Orientation  Mid    Pain Descriptors / Indicators  Pounding;Tightness    Pain Type  Chronic pain    Pain Onset  More than a month ago    Pain Frequency  Constant    Aggravating Factors   constipation, diarrhea, sitting    Pain Relieving Factors  TENS unit    Multiple Pain Sites  No                        [facility] Adult PT Treatment/Exercise - 02/11/20 0001      Lumbar Exercises: Stretches   Double Knee to Chest Stretch  2 reps;30 seconds    Double Knee to Chest Stretch Limitations  with     Lower Trunk Rotation  5 reps;10 seconds    Other Lumbar Stretch Exercise  supine hip IT then to neutral 10x each side      Lumbar Exercises: Aerobic   Nustep  5 minutes while assessing  patient      Manual Therapy   Manual Therapy  Myofascial release    Myofascial Release  release of the lower urogenital diaphragm going through the 3 planes of fascia, releease fot eh left upper quadrant around the stomach, release fo the sac of Douglas, release of the midline of the abdomen                PT Short Term Goals - 01/26/20 1114      PT SHORT TERM GOAL #1   Title  Pt will show IND with HEP    Baseline  still learning    Time  4    Period  Weeks    Status  On-going      PT SHORT TERM GOAL #2   Title  understand ways to manage pain with meditation    Time  4    Period  Weeks    Status  Achieved      PT SHORT TERM GOAL #3   Title  Pt will demo increased diaphragmatic excursion and pelvic floor lengthening in order to promote improved toileting mechanics     Baseline  constipated and tight diaphragm    Time  4    Period  Weeks    Status  On-going        PT Long Term Goals - 01/26/20 1115      PT LONG TERM GOAL #1   Title  independent wtih HEP and understand how to progress herself    Baseline  still learning new exercises as she  progresses    Time  12    Period  Weeks    Status  On-going      PT LONG TERM GOAL #2   Title  able to bend forward fully to pick up items due to abdominal pain decreased >/= 50%    Baseline  abdominal pain 3/10    Time  12    Period  Weeks    Status  On-going      PT LONG TERM GOAL #3   Title  able to walk for 15 minutes with fluid motion and no increase in pain so she is able to walk on her vacation    Baseline  3 minute walking due to increased pain    Time  12    Period  Weeks    Status  On-going      PT LONG TERM GOAL #4   Title  able to relax her pelvic floor to have a bowel movement and reduce her constipation by 50%    Baseline  can be constipated for 3 weeks    Time  12    Period  Weeks    Status  On-going      PT LONG TERM GOAL #5   Title  bilateral hip strength >/= 4/5 with minimal to no abdominal pain so patient is able to perform home tasks with greater ease    Baseline  strength is average of 3/5    Time  12    Period  Weeks    Status  On-going            Plan - 02/11/20 1611    Clinical Impression Statement  Patient was not able to come for 2 weeks due to being sick from being so constipated. Patient was not able to do her HEP due to the pain and her vomiting. Patient had to go to the emergency room due to  pain. Patient had increased bowel sounds after manual work. Patient was doing stretches to improve elongation of the anus and movement of the intestines. Patient will benefit from skilled therapy to improve overall strength, reduce her chronic pain in the back, and pelvic floor relaxtion and coordination.    Personal Factors and Comorbidities  Comorbidity 3+;Fitness    Comorbidities  fibromyalgia; small bowel obstruction s/p resection plus LOA; depression; bipolar 2 disorder, IBS    Examination-Activity Limitations  Continence;Toileting;Stand;Stairs;Lift;Squat;Sit;Bend;Bed Mobility;Locomotion Level    Examination-Participation Restrictions   Cleaning;Community Activity;Interpersonal Relationship;Laundry;Shop;Driving    Stability/Clinical Decision Making  Evolving/Moderate complexity    Rehab Potential  Good    PT Frequency  1x / week   1 time per week for 3 weeks then 3 times per week for 9 weeks   PT Duration  12 weeks    PT Treatment/Interventions  Biofeedback;Cryotherapy;Electrical Stimulation;Moist Heat;Ultrasound;Neuromuscular re-education;Therapeutic exercise;Therapeutic activities;Patient/family education;Manual techniques;Dry needling;Spinal Manipulations    PT Next Visit Plan  vaginal massage, see if she  got the wand,  gentle cardio, soft tissue work vaginally, abdominal work    PT Home Exercise Plan  Access Code: [email or web address]    Recommended Other Services  Md signed intial eval    Consulted and Agree with Plan of Care  Patient       Patient will benefit from skilled therapeutic intervention in order to improve the following deficits and impairments:  Decreased coordination, Decreased range of motion, Increased fascial restricitons, Pain, Decreased endurance, Increased muscle spasms, Decreased activity tolerance, Decreased strength, Decreased mobility  Visit Diagnosis: Muscle weakness (generalized)  Cramp and spasm  Abnormal posture  Acute midline low back pain without sciatica  Generalized abdominal pain  Other lack of coordination     Problem List Patient Active Problem List   Diagnosis Date Noted  . Polypharmacy 12/31/2019  . MDD (major depressive disorder) 09/22/2019  . Paresthesia 07/08/2018  . Chronic migraine 07/08/2018    Earlie Counts, PT 02/11/20 4:17 PM   [facility] [address] [place], [place], [address] Phone: [phone]   Fax:  [phone]  Name: Charlene Ferguson MRN: [id number] Date of Birth: 1985/10/13  PHYSICAL THERAPY DISCHARGE SUMMARY  Visits from Start of Care: 4  Current functional level related to goals /  functional outcomes: See above. Patient has not been to therapy since 02/11/2020 to be reassessed. She has no-showed for her last 2 visits. She is being discharged due to the attendance policy.    Remaining deficits: See above.    Education / Equipment: HEP Plan:                                                    Patient goals were not met. Patient is being discharged due to not returning since the last visit. Thank you for the referral. Earlie Counts, PT 04/04/20 12:50 PM    ?????

## 2020-02-17 DIAGNOSIS — F411 Generalized anxiety disorder: Secondary | ICD-10-CM | POA: Diagnosis not present

## 2020-02-17 DIAGNOSIS — F5082 Avoidant/restrictive food intake disorder: Secondary | ICD-10-CM | POA: Diagnosis not present

## 2020-02-17 DIAGNOSIS — F410 Panic disorder [episodic paroxysmal anxiety] without agoraphobia: Secondary | ICD-10-CM | POA: Diagnosis not present

## 2020-02-17 DIAGNOSIS — F4312 Post-traumatic stress disorder, chronic: Secondary | ICD-10-CM | POA: Diagnosis not present

## 2020-02-19 ENCOUNTER — Ambulatory Visit: Payer: Medicaid Other | Admitting: Physical Therapy

## 2020-02-23 DIAGNOSIS — F4312 Post-traumatic stress disorder, chronic: Secondary | ICD-10-CM | POA: Diagnosis not present

## 2020-02-23 DIAGNOSIS — F410 Panic disorder [episodic paroxysmal anxiety] without agoraphobia: Secondary | ICD-10-CM | POA: Diagnosis not present

## 2020-02-23 DIAGNOSIS — F411 Generalized anxiety disorder: Secondary | ICD-10-CM | POA: Diagnosis not present

## 2020-02-23 DIAGNOSIS — F5082 Avoidant/restrictive food intake disorder: Secondary | ICD-10-CM | POA: Diagnosis not present

## 2020-02-29 DIAGNOSIS — F5082 Avoidant/restrictive food intake disorder: Secondary | ICD-10-CM | POA: Diagnosis not present

## 2020-02-29 DIAGNOSIS — F4312 Post-traumatic stress disorder, chronic: Secondary | ICD-10-CM | POA: Diagnosis not present

## 2020-02-29 DIAGNOSIS — F410 Panic disorder [episodic paroxysmal anxiety] without agoraphobia: Secondary | ICD-10-CM | POA: Diagnosis not present

## 2020-02-29 DIAGNOSIS — F411 Generalized anxiety disorder: Secondary | ICD-10-CM | POA: Diagnosis not present

## 2020-03-02 ENCOUNTER — Ambulatory Visit: Payer: Medicaid Other | Admitting: Physical Therapy

## 2020-03-09 ENCOUNTER — Encounter: Payer: Medicaid Other | Admitting: Physical Therapy

## 2020-03-09 DIAGNOSIS — F5082 Avoidant/restrictive food intake disorder: Secondary | ICD-10-CM | POA: Diagnosis not present

## 2020-03-09 DIAGNOSIS — F410 Panic disorder [episodic paroxysmal anxiety] without agoraphobia: Secondary | ICD-10-CM | POA: Diagnosis not present

## 2020-03-09 DIAGNOSIS — F411 Generalized anxiety disorder: Secondary | ICD-10-CM | POA: Diagnosis not present

## 2020-03-09 DIAGNOSIS — F4312 Post-traumatic stress disorder, chronic: Secondary | ICD-10-CM | POA: Diagnosis not present

## 2020-03-14 ENCOUNTER — Ambulatory Visit: Payer: Medicaid Other | Admitting: Physical Therapy

## 2020-03-22 ENCOUNTER — Encounter: Payer: Self-pay | Admitting: Neurology

## 2020-03-24 ENCOUNTER — Ambulatory Visit: Payer: Medicaid Other | Attending: Internal Medicine | Admitting: Physical Therapy

## 2020-03-24 ENCOUNTER — Telehealth: Payer: Self-pay | Admitting: Physical Therapy

## 2020-03-24 DIAGNOSIS — N644 Mastodynia: Secondary | ICD-10-CM | POA: Diagnosis not present

## 2020-03-24 DIAGNOSIS — F419 Anxiety disorder, unspecified: Secondary | ICD-10-CM | POA: Diagnosis not present

## 2020-03-24 DIAGNOSIS — R079 Chest pain, unspecified: Secondary | ICD-10-CM | POA: Diagnosis not present

## 2020-03-24 DIAGNOSIS — Z885 Allergy status to narcotic agent status: Secondary | ICD-10-CM | POA: Diagnosis not present

## 2020-03-24 DIAGNOSIS — G8929 Other chronic pain: Secondary | ICD-10-CM | POA: Diagnosis not present

## 2020-03-24 DIAGNOSIS — R102 Pelvic and perineal pain: Secondary | ICD-10-CM | POA: Diagnosis not present

## 2020-03-24 DIAGNOSIS — Z79899 Other long term (current) drug therapy: Secondary | ICD-10-CM | POA: Diagnosis not present

## 2020-03-24 DIAGNOSIS — F3181 Bipolar II disorder: Secondary | ICD-10-CM | POA: Diagnosis not present

## 2020-03-24 DIAGNOSIS — Z888 Allergy status to other drugs, medicaments and biological substances status: Secondary | ICD-10-CM | POA: Diagnosis not present

## 2020-03-24 DIAGNOSIS — M797 Fibromyalgia: Secondary | ICD-10-CM | POA: Diagnosis not present

## 2020-03-24 NOTE — Telephone Encounter (Signed)
Called patient about her appointment she missed at 12:30. Therapist explained to patient the no-show policy and if she no shows next visit with need a new script.  Eulis Foster, PT @7 /09/2019@ 12:51 PM

## 2020-03-30 DIAGNOSIS — F411 Generalized anxiety disorder: Secondary | ICD-10-CM | POA: Diagnosis not present

## 2020-03-30 DIAGNOSIS — F410 Panic disorder [episodic paroxysmal anxiety] without agoraphobia: Secondary | ICD-10-CM | POA: Diagnosis not present

## 2020-03-30 DIAGNOSIS — F4312 Post-traumatic stress disorder, chronic: Secondary | ICD-10-CM | POA: Diagnosis not present

## 2020-03-30 DIAGNOSIS — F5082 Avoidant/restrictive food intake disorder: Secondary | ICD-10-CM | POA: Diagnosis not present

## 2020-04-01 DIAGNOSIS — N644 Mastodynia: Secondary | ICD-10-CM | POA: Diagnosis not present

## 2020-04-01 DIAGNOSIS — N63 Unspecified lump in unspecified breast: Secondary | ICD-10-CM | POA: Diagnosis not present

## 2020-04-04 ENCOUNTER — Telehealth: Payer: Self-pay | Admitting: Physical Therapy

## 2020-04-04 ENCOUNTER — Ambulatory Visit: Payer: Medicaid Other | Admitting: Physical Therapy

## 2020-04-04 NOTE — Telephone Encounter (Signed)
Called patient about her appointment she missed today at 12:30.  Eulis Foster, PT @7 /08/2020@ 12:47 PM

## 2020-04-06 DIAGNOSIS — F411 Generalized anxiety disorder: Secondary | ICD-10-CM | POA: Diagnosis not present

## 2020-04-06 DIAGNOSIS — F410 Panic disorder [episodic paroxysmal anxiety] without agoraphobia: Secondary | ICD-10-CM | POA: Diagnosis not present

## 2020-04-06 DIAGNOSIS — F4312 Post-traumatic stress disorder, chronic: Secondary | ICD-10-CM | POA: Diagnosis not present

## 2020-04-06 DIAGNOSIS — F5082 Avoidant/restrictive food intake disorder: Secondary | ICD-10-CM | POA: Diagnosis not present

## 2020-04-11 ENCOUNTER — Encounter: Payer: Medicaid Other | Admitting: Physical Therapy

## 2020-04-13 DIAGNOSIS — F4312 Post-traumatic stress disorder, chronic: Secondary | ICD-10-CM | POA: Diagnosis not present

## 2020-04-13 DIAGNOSIS — F411 Generalized anxiety disorder: Secondary | ICD-10-CM | POA: Diagnosis not present

## 2020-04-13 DIAGNOSIS — F410 Panic disorder [episodic paroxysmal anxiety] without agoraphobia: Secondary | ICD-10-CM | POA: Diagnosis not present

## 2020-04-13 DIAGNOSIS — F5082 Avoidant/restrictive food intake disorder: Secondary | ICD-10-CM | POA: Diagnosis not present

## 2020-04-18 ENCOUNTER — Encounter: Payer: Medicaid Other | Admitting: Physical Therapy

## 2020-04-20 DIAGNOSIS — F4312 Post-traumatic stress disorder, chronic: Secondary | ICD-10-CM | POA: Diagnosis not present

## 2020-04-20 DIAGNOSIS — F5082 Avoidant/restrictive food intake disorder: Secondary | ICD-10-CM | POA: Diagnosis not present

## 2020-04-20 DIAGNOSIS — F411 Generalized anxiety disorder: Secondary | ICD-10-CM | POA: Diagnosis not present

## 2020-04-20 DIAGNOSIS — F410 Panic disorder [episodic paroxysmal anxiety] without agoraphobia: Secondary | ICD-10-CM | POA: Diagnosis not present

## 2020-04-27 DIAGNOSIS — F411 Generalized anxiety disorder: Secondary | ICD-10-CM | POA: Diagnosis not present

## 2020-04-27 DIAGNOSIS — F410 Panic disorder [episodic paroxysmal anxiety] without agoraphobia: Secondary | ICD-10-CM | POA: Diagnosis not present

## 2020-04-27 DIAGNOSIS — F5082 Avoidant/restrictive food intake disorder: Secondary | ICD-10-CM | POA: Diagnosis not present

## 2020-04-27 DIAGNOSIS — F4312 Post-traumatic stress disorder, chronic: Secondary | ICD-10-CM | POA: Diagnosis not present

## 2020-05-02 DIAGNOSIS — Z0271 Encounter for disability determination: Secondary | ICD-10-CM

## 2020-05-11 ENCOUNTER — Other Ambulatory Visit: Payer: Self-pay | Admitting: Neurology

## 2020-05-11 DIAGNOSIS — F5082 Avoidant/restrictive food intake disorder: Secondary | ICD-10-CM | POA: Diagnosis not present

## 2020-05-11 DIAGNOSIS — F410 Panic disorder [episodic paroxysmal anxiety] without agoraphobia: Secondary | ICD-10-CM | POA: Diagnosis not present

## 2020-05-11 DIAGNOSIS — F4312 Post-traumatic stress disorder, chronic: Secondary | ICD-10-CM | POA: Diagnosis not present

## 2020-05-11 DIAGNOSIS — F411 Generalized anxiety disorder: Secondary | ICD-10-CM | POA: Diagnosis not present

## 2020-05-17 DIAGNOSIS — H543 Unqualified visual loss, both eyes: Secondary | ICD-10-CM | POA: Diagnosis not present

## 2020-05-17 DIAGNOSIS — H04129 Dry eye syndrome of unspecified lacrimal gland: Secondary | ICD-10-CM | POA: Diagnosis not present

## 2020-05-18 DIAGNOSIS — F5082 Avoidant/restrictive food intake disorder: Secondary | ICD-10-CM | POA: Diagnosis not present

## 2020-05-18 DIAGNOSIS — F411 Generalized anxiety disorder: Secondary | ICD-10-CM | POA: Diagnosis not present

## 2020-05-18 DIAGNOSIS — F4312 Post-traumatic stress disorder, chronic: Secondary | ICD-10-CM | POA: Diagnosis not present

## 2020-05-18 DIAGNOSIS — F410 Panic disorder [episodic paroxysmal anxiety] without agoraphobia: Secondary | ICD-10-CM | POA: Diagnosis not present

## 2020-05-22 DIAGNOSIS — R1111 Vomiting without nausea: Secondary | ICD-10-CM | POA: Diagnosis not present

## 2020-05-22 DIAGNOSIS — F29 Unspecified psychosis not due to a substance or known physiological condition: Secondary | ICD-10-CM | POA: Diagnosis not present

## 2020-05-22 DIAGNOSIS — R0689 Other abnormalities of breathing: Secondary | ICD-10-CM | POA: Diagnosis not present

## 2020-05-22 DIAGNOSIS — R457 State of emotional shock and stress, unspecified: Secondary | ICD-10-CM | POA: Diagnosis not present

## 2020-05-22 DIAGNOSIS — R4182 Altered mental status, unspecified: Secondary | ICD-10-CM | POA: Diagnosis not present

## 2020-05-22 DIAGNOSIS — R0789 Other chest pain: Secondary | ICD-10-CM | POA: Diagnosis not present

## 2020-05-22 DIAGNOSIS — R52 Pain, unspecified: Secondary | ICD-10-CM | POA: Diagnosis not present

## 2020-05-22 DIAGNOSIS — R079 Chest pain, unspecified: Secondary | ICD-10-CM | POA: Diagnosis not present

## 2020-05-24 DIAGNOSIS — M545 Low back pain: Secondary | ICD-10-CM | POA: Diagnosis not present

## 2020-05-24 DIAGNOSIS — R42 Dizziness and giddiness: Secondary | ICD-10-CM | POA: Diagnosis not present

## 2020-05-24 DIAGNOSIS — F909 Attention-deficit hyperactivity disorder, unspecified type: Secondary | ICD-10-CM | POA: Diagnosis not present

## 2020-05-24 DIAGNOSIS — E7800 Pure hypercholesterolemia, unspecified: Secondary | ICD-10-CM | POA: Diagnosis not present

## 2020-05-24 DIAGNOSIS — G43909 Migraine, unspecified, not intractable, without status migrainosus: Secondary | ICD-10-CM | POA: Diagnosis not present

## 2020-05-26 ENCOUNTER — Telehealth: Payer: Self-pay | Admitting: Neurology

## 2020-05-26 NOTE — Telephone Encounter (Signed)
It is Ok to switch to Dr Ahern °

## 2020-05-26 NOTE — Telephone Encounter (Signed)
Patient is requesting a provider switch from Dr. Terrace Arabia to Dr. Lucia Gaskins. New referral was sent over for numbness. Please advise if the switch is acceptable. Thank you

## 2020-05-26 NOTE — Telephone Encounter (Signed)
That's fine with me thanks 

## 2020-05-27 DIAGNOSIS — F411 Generalized anxiety disorder: Secondary | ICD-10-CM | POA: Diagnosis not present

## 2020-05-27 DIAGNOSIS — F410 Panic disorder [episodic paroxysmal anxiety] without agoraphobia: Secondary | ICD-10-CM | POA: Diagnosis not present

## 2020-05-27 DIAGNOSIS — F4312 Post-traumatic stress disorder, chronic: Secondary | ICD-10-CM | POA: Diagnosis not present

## 2020-05-27 DIAGNOSIS — F5082 Avoidant/restrictive food intake disorder: Secondary | ICD-10-CM | POA: Diagnosis not present

## 2020-06-01 DIAGNOSIS — F410 Panic disorder [episodic paroxysmal anxiety] without agoraphobia: Secondary | ICD-10-CM | POA: Diagnosis not present

## 2020-06-01 DIAGNOSIS — F411 Generalized anxiety disorder: Secondary | ICD-10-CM | POA: Diagnosis not present

## 2020-06-01 DIAGNOSIS — F5082 Avoidant/restrictive food intake disorder: Secondary | ICD-10-CM | POA: Diagnosis not present

## 2020-06-01 DIAGNOSIS — F4312 Post-traumatic stress disorder, chronic: Secondary | ICD-10-CM | POA: Diagnosis not present

## 2020-06-02 DIAGNOSIS — Z6820 Body mass index (BMI) 20.0-20.9, adult: Secondary | ICD-10-CM | POA: Diagnosis not present

## 2020-06-02 DIAGNOSIS — M6289 Other specified disorders of muscle: Secondary | ICD-10-CM | POA: Diagnosis not present

## 2020-06-08 DIAGNOSIS — F5082 Avoidant/restrictive food intake disorder: Secondary | ICD-10-CM | POA: Diagnosis not present

## 2020-06-08 DIAGNOSIS — F4312 Post-traumatic stress disorder, chronic: Secondary | ICD-10-CM | POA: Diagnosis not present

## 2020-06-08 DIAGNOSIS — F410 Panic disorder [episodic paroxysmal anxiety] without agoraphobia: Secondary | ICD-10-CM | POA: Diagnosis not present

## 2020-06-08 DIAGNOSIS — F411 Generalized anxiety disorder: Secondary | ICD-10-CM | POA: Diagnosis not present

## 2020-06-15 DIAGNOSIS — F5082 Avoidant/restrictive food intake disorder: Secondary | ICD-10-CM | POA: Diagnosis not present

## 2020-06-15 DIAGNOSIS — F411 Generalized anxiety disorder: Secondary | ICD-10-CM | POA: Diagnosis not present

## 2020-06-15 DIAGNOSIS — F4312 Post-traumatic stress disorder, chronic: Secondary | ICD-10-CM | POA: Diagnosis not present

## 2020-06-15 DIAGNOSIS — F410 Panic disorder [episodic paroxysmal anxiety] without agoraphobia: Secondary | ICD-10-CM | POA: Diagnosis not present

## 2020-06-22 DIAGNOSIS — F411 Generalized anxiety disorder: Secondary | ICD-10-CM | POA: Diagnosis not present

## 2020-06-22 DIAGNOSIS — F5082 Avoidant/restrictive food intake disorder: Secondary | ICD-10-CM | POA: Diagnosis not present

## 2020-06-22 DIAGNOSIS — F4312 Post-traumatic stress disorder, chronic: Secondary | ICD-10-CM | POA: Diagnosis not present

## 2020-06-22 DIAGNOSIS — F410 Panic disorder [episodic paroxysmal anxiety] without agoraphobia: Secondary | ICD-10-CM | POA: Diagnosis not present

## 2020-06-29 DIAGNOSIS — F411 Generalized anxiety disorder: Secondary | ICD-10-CM | POA: Diagnosis not present

## 2020-06-29 DIAGNOSIS — F4312 Post-traumatic stress disorder, chronic: Secondary | ICD-10-CM | POA: Diagnosis not present

## 2020-06-29 DIAGNOSIS — F5082 Avoidant/restrictive food intake disorder: Secondary | ICD-10-CM | POA: Diagnosis not present

## 2020-06-29 DIAGNOSIS — F410 Panic disorder [episodic paroxysmal anxiety] without agoraphobia: Secondary | ICD-10-CM | POA: Diagnosis not present

## 2020-07-06 DIAGNOSIS — F5082 Avoidant/restrictive food intake disorder: Secondary | ICD-10-CM | POA: Diagnosis not present

## 2020-07-06 DIAGNOSIS — F4312 Post-traumatic stress disorder, chronic: Secondary | ICD-10-CM | POA: Diagnosis not present

## 2020-07-06 DIAGNOSIS — F410 Panic disorder [episodic paroxysmal anxiety] without agoraphobia: Secondary | ICD-10-CM | POA: Diagnosis not present

## 2020-07-06 DIAGNOSIS — F411 Generalized anxiety disorder: Secondary | ICD-10-CM | POA: Diagnosis not present

## 2020-07-13 DIAGNOSIS — F4312 Post-traumatic stress disorder, chronic: Secondary | ICD-10-CM | POA: Diagnosis not present

## 2020-07-13 DIAGNOSIS — F5082 Avoidant/restrictive food intake disorder: Secondary | ICD-10-CM | POA: Diagnosis not present

## 2020-07-13 DIAGNOSIS — F411 Generalized anxiety disorder: Secondary | ICD-10-CM | POA: Diagnosis not present

## 2020-07-13 DIAGNOSIS — F410 Panic disorder [episodic paroxysmal anxiety] without agoraphobia: Secondary | ICD-10-CM | POA: Diagnosis not present

## 2020-07-20 DIAGNOSIS — F5082 Avoidant/restrictive food intake disorder: Secondary | ICD-10-CM | POA: Diagnosis not present

## 2020-07-20 DIAGNOSIS — F410 Panic disorder [episodic paroxysmal anxiety] without agoraphobia: Secondary | ICD-10-CM | POA: Diagnosis not present

## 2020-07-20 DIAGNOSIS — F411 Generalized anxiety disorder: Secondary | ICD-10-CM | POA: Diagnosis not present

## 2020-07-20 DIAGNOSIS — F4312 Post-traumatic stress disorder, chronic: Secondary | ICD-10-CM | POA: Diagnosis not present

## 2020-07-27 ENCOUNTER — Telehealth: Payer: Self-pay | Admitting: Neurology

## 2020-07-27 NOTE — Telephone Encounter (Signed)
Called and left a message for patient. She appears to be doing quite well on Ajovy and has already established care with a physician in this group, Dr. Terrace Arabia. Calling to see why she is coming in to see me tomorrow, if she is still doing well we can postpone visit. I am happy to take over her care if needed but if doing well we can change to video or postpone.

## 2020-07-28 ENCOUNTER — Encounter: Payer: Self-pay | Admitting: *Deleted

## 2020-07-28 ENCOUNTER — Encounter: Payer: Self-pay | Admitting: Neurology

## 2020-07-28 ENCOUNTER — Other Ambulatory Visit: Payer: Self-pay

## 2020-07-28 ENCOUNTER — Telehealth: Payer: Self-pay | Admitting: Neurology

## 2020-07-28 ENCOUNTER — Ambulatory Visit: Payer: Medicaid Other | Admitting: Neurology

## 2020-07-28 VITALS — BP 112/64 | HR 72 | Ht 59.0 in | Wt 105.0 lb

## 2020-07-28 DIAGNOSIS — R413 Other amnesia: Secondary | ICD-10-CM | POA: Diagnosis not present

## 2020-07-28 DIAGNOSIS — R531 Weakness: Secondary | ICD-10-CM | POA: Diagnosis not present

## 2020-07-28 DIAGNOSIS — R202 Paresthesia of skin: Secondary | ICD-10-CM | POA: Diagnosis not present

## 2020-07-28 DIAGNOSIS — R4701 Aphasia: Secondary | ICD-10-CM | POA: Diagnosis not present

## 2020-07-28 DIAGNOSIS — R200 Anesthesia of skin: Secondary | ICD-10-CM | POA: Diagnosis not present

## 2020-07-28 DIAGNOSIS — M6240 Contracture of muscle, unspecified site: Secondary | ICD-10-CM | POA: Diagnosis not present

## 2020-07-28 NOTE — Patient Instructions (Addendum)
Blood work EMG/NCS MRI cervical spine   Vitamin B12 Deficiency Vitamin B12 deficiency occurs when the body does not have enough vitamin B12, which is an important vitamin. The body needs this vitamin:  To make red blood cells.  To make DNA. This is the genetic material inside cells.  To help the nerves work properly so they can carry messages from the brain to the body. Vitamin B12 deficiency can cause various health problems, such as a low red blood cell count (anemia) or nerve damage. What are the causes? This condition may be caused by:  Not eating enough foods that contain vitamin B12.  Not having enough stomach acid and digestive fluids to properly absorb vitamin B12 from the food that you eat.  Certain digestive system diseases that make it hard to absorb vitamin B12. These diseases include Crohn's disease, chronic pancreatitis, and cystic fibrosis.  A condition in which the body does not make enough of a protein (intrinsic factor), resulting in too few red blood cells (pernicious anemia).  Having a surgery in which part of the stomach or small intestine is removed.  Taking certain medicines that make it hard for the body to absorb vitamin B12. These medicines include: ? Heartburn medicines (antacids and proton pump inhibitors). ? Certain antibiotic medicines. ? Some medicines that are used to treat diabetes, tuberculosis, gout, or high cholesterol. What increases the risk? The following factors may make you more likely to develop a B12 deficiency:  Being older than age 2.  Eating a vegetarian or vegan diet, especially while you are pregnant.  Eating a poor diet while you are pregnant.  Taking certain medicines.  Having alcoholism. What are the signs or symptoms? In some cases, there are no symptoms of this condition. If the condition leads to anemia or nerve damage, various symptoms can occur, such as:  Weakness.  Fatigue.  Loss of appetite.  Weight  loss.  Numbness or tingling in your hands and feet.  Redness and burning of the tongue.  Confusion or memory problems.  Depression.  Sensory problems, such as color blindness, ringing in the ears, or loss of taste.  Diarrhea or constipation.  Trouble walking. If anemia is severe, symptoms can include:  Shortness of breath.  Dizziness.  Rapid heart rate (tachycardia). How is this diagnosed? This condition may be diagnosed with a blood test to measure the level of vitamin B12 in your blood. You may also have other tests, including:  A group of tests that measure certain characteristics of blood cells (complete blood count, CBC).  A blood test to measure intrinsic factor.  A procedure where a thin tube with a camera on the end is used to look into your stomach or intestines (endoscopy). Other tests may be needed to discover the cause of B12 deficiency. How is this treated? Treatment for this condition depends on the cause. This condition may be treated by:  Changing your eating and drinking habits, such as: ? Eating more foods that contain vitamin B12. ? Drinking less alcohol or no alcohol.  Getting vitamin B12 injections.  Taking vitamin B12 supplements. Your health care provider will tell you which dosage is best for you. Follow these instructions at home: Eating and drinking   Eat lots of healthy foods that contain vitamin B12, including: ? Meats and poultry. This includes beef, pork, chicken, [place], and organ meats, such as liver. ? Seafood. This includes clams, rainbow trout, salmon, tuna, and haddock. ? Eggs. ? Cereal and dairy products  that are fortified. This means that vitamin B12 has been added to the food. Check the label on the package to see if the food is fortified. The items listed above may not be a complete list of recommended foods and beverages. Contact a dietitian for more information. General instructions  Get any injections that are prescribed  by your health care provider.  Take supplements only as told by your health care provider. Follow the directions carefully.  Do not drink alcohol if your health care provider tells you not to. In some cases, you may only be asked to limit alcohol use.  Keep all follow-up visits as told by your health care provider. This is important. Contact a health care provider if:  Your symptoms come back. Get help right away if you:  Develop shortness of breath.  Have a rapid heart rate.  Have chest pain.  Become dizzy or lose consciousness. Summary  Vitamin B12 deficiency occurs when the body does not have enough vitamin B12.  The main causes of vitamin B12 deficiency include dietary deficiency, digestive diseases, pernicious anemia, and having a surgery in which part of the stomach or small intestine is removed.  In some cases, there are no symptoms of this condition. If the condition leads to anemia or nerve damage, various symptoms can occur, such as weakness, shortness of breath, and numbness.  Treatment may include getting vitamin B12 injections or taking vitamin B12 supplements. Eat lots of healthy foods that contain vitamin B12. This information is not intended to replace advice given to you by your health care provider. Make sure you discuss any questions you have with your health care provider. Document Revised: 02/27/2019 Document Reviewed: 05/20/2018 Elsevier Patient Education  2020 Else[employer] Inc[employer]    Electromyoneurogram Electromyoneurogram is a test to check how well your muscles and nerves are working. This procedure includes the combined use of electromyogram (EMG) and nerve conduction study (NCS). EMG is used to look for muscular disorders. NCS, which is also called electroneurogram, measures how well your nerves are controlling your muscles. The procedures are usually done together to check if your muscles and nerves are healthy. If the results of the tests are abnormal, this  may indicate disease or injury, such as a neuromuscular disease or peripheral nerve damage. Tell a health care provider about:  Any allergies you have.  All medicines you are taking, including vitamins, herbs, eye drops, creams, and over-the-counter medicines.  Any problems you or family members have had with anesthetic medicines.  Any blood disorders you have.  Any surgeries you have had.  Any medical conditions you have.  If you have a pacemaker.  Whether you are pregnant or may be pregnant. What are the risks? Generally, this is a safe procedure. However, problems may occur, including:  Infection where the electrodes were inserted.  Bleeding. What happens before the procedure? Medicines Ask your health care provider about:  Changing or stopping your regular medicines. This is especially important if you are taking diabetes medicines or blood thinners.  Taking medicines such as aspirin and ibuprofen. These medicines can thin your blood. Do not take these medicines unless your health care provider tells you to take them.  Taking over-the-counter medicines, vitamins, herbs, and supplements. General instructions  Your health care provider may ask you to avoid: ? Beverages that have caffeine, such as coffee and tea. ? Any products that contain nicotine or tobacco. These products include cigarettes, e-cigarettes, and chewing tobacco. If you need help quitting, ask  your health care provider.  Do not use lotions or creams on the same day that you will be having the procedure. What happens during the procedure? For EMG   Your health care provider will ask you to stay in a position so that he or she can access the muscle that will be studied. You may be standing, sitting, or lying down.  You may be given a medicine that numbs the area (local anesthetic).  A very thin needle that has an electrode will be inserted into your muscle.  Another small electrode will be placed on  your skin near the muscle.  Your health care provider will ask you to continue to remain still.  The electrodes will send a signal that tells about the electrical activity of your muscles. You may see this on a monitor or hear it in the room.  After your muscles have been studied at rest, your health care provider will ask you to contract or flex your muscles. The electrodes will send a signal that tells about the electrical activity of your muscles.  Your health care provider will remove the electrodes and the electrode needles when the procedure is finished. The procedure may vary among health care providers and hospitals. For NCS   An electrode that records your nerve activity (recording electrode) will be placed on your skin by the muscle that is being studied.  An electrode that is used as a reference (reference electrode) will be placed near the recording electrode.  A paste or gel will be applied to your skin between the recording electrode and the reference electrode.  Your nerve will be stimulated with a mild shock. Your health care provider will measure how much time it takes for your muscle to react.  Your health care provider will remove the electrodes and the gel when the procedure is finished. The procedure may vary among health care providers and hospitals. What happens after the procedure?  It is up to you to get the results of your procedure. Ask your health care provider, or the department that is doing the procedure, when your results will be ready.  Your health care provider may: ? Give you medicines for any pain. ? Monitor the insertion sites to make sure that bleeding stops. Summary  Electromyoneurogram is a test to check how well your muscles and nerves are working.  If the results of the tests are abnormal, this may indicate disease or injury.  This is a safe procedure. However, problems may occur, such as bleeding and infection.  Your health care  provider will do two tests to complete this procedure. One checks your muscles (EMG) and another checks your nerves (NCS).  It is up to you to get the results of your procedure. Ask your health care provider, or the department that is doing the procedure, when your results will be ready. This information is not intended to replace advice given to you by your health care provider. Make sure you discuss any questions you have with your health care provider. Document Revised: 05/27/2018 Document Reviewed: 05/09/2018 Elsevier Patient Education  2020 [employer].

## 2020-07-28 NOTE — Telephone Encounter (Signed)
[facility] [place] pending faxed notes. Its also pending the [facility] location for GI to see if they are in network.

## 2020-07-28 NOTE — Progress Notes (Signed)
[facility]    Provider:  Dr Lucia Gaskins Requesting Provider: Evelene Croon, MD Primary Care Provider:  Evelene Croon, MD  CC:  "the nervous system"  HPI:  Charlene Ferguson is a 34 y.o. female here as requested by Evelene Croon, MD for numbness. PMHx migraines, kidney stones, fibromyalgia, depression, anxiety. At last appointment with Dr. Terrace Arabia she was doing well on Ajovy only 1 migraine per week. Relpax worked   She is here with a list of things written down. Her fingers twitch a lot both of them at different times. When she is sitting her vision goes blurry both eyes. Her whole body goes numb. Her language and speech difficult to talk.  Started 2 years ago. Tailbone hurts. Weakness of muscles. Happen evry day on and off. Getting worse. "Everything is getting worse". She is forgetful even moreso than what she should be. She has always had issues with comprehension. Not painful except when she is having a bad day. Some days the pain will "shoot through my entire body". Symmetric both sides. Randomly, no triggers. No other focal neurologic deficits, associated symptoms, inciting events or modifiable factors.  Medications tried include: propranolol, lyrica, toradol, lamictal, ajovy, relpax, zyprexa, trazodone, cymbalta, tylenol, fioricet, flexeril, diclofenac, decadron inj, benadryl inj, aimovig, prozac, gabapentin, mobic, melatonin, nortriptyline, zofran po/inj, phenergan supp/po, maxalt, sumatriptan, tizanidine, topamax  Reviewed notes, labs and imaging from outside physicians, which showed:  CBC unremarkable alt 57, tsh nml.   Personally reviewed MRI brain from 07/2019 unremarkable, few t2 foci likely migraine related or microvascular.   Review of Systems: Patient complains of symptoms per HPI as well as the following symptoms numbness, weakness, vision changes. Pertinent negatives and positives per HPI. All others negative.   Social History   Socioeconomic  History  . Marital status: Single    Spouse name: Not on file  . Number of children: 1  . Years of education: BA  . Highest education level: Not on file  Occupational History  . Occupation: [employer]   Tobacco Use  . Smoking status: Never Smoker  . Smokeless tobacco: Never Used  Vaping Use  . Vaping Use: Never used  Substance and Sexual Activity  . Alcohol use: No  . Drug use: No  . Sexual activity: Yes    Birth control/protection: Pill  Other Topics Concern  . Not on file  Social History Narrative   Lives with child   Caffeine use: daily 2 cups   Right handed   Social Determinants of Health   Financial Resource Strain:   . Difficulty of Paying Living Expenses: Not on file  Food Insecurity:   . Worried About Programme researcher, broadcasting/film/video in the Last Year: Not on file  . Ran Out of Food in the Last Year: Not on file  Transportation Needs:   . Lack of Transportation (Medical): Not on file  . Lack of Transportation (Non-Medical): Not on file  Physical Activity:   . Days of Exercise per Week: Not on file  . Minutes of Exercise per Session: Not on file  Stress:   . Feeling of Stress : Not on file  Social Connections:   . Frequency of Communication with Friends and Family: Not on file  . Frequency of Social Gatherings with Friends and Family: Not on file  . Attends Religious Services: Not on file  . Active Member of Clubs or Organizations: Not on file  . Attends Banker Meetings: Not on file  . Marital  Status: Not on file  Intimate Partner Violence:   . Fear of Current or Ex-Partner: Not on file  . Emotionally Abused: Not on file  . Physically Abused: Not on file  . Sexually Abused: Not on file    Family History  Problem Relation Age of Onset  . Prostate cancer Neg Hx   . Kidney disease Neg Hx   . Kidney cancer Neg Hx     Past Medical History:  Diagnosis Date  . Anxiety   . Cholelithiasis   . Depression   . Fibromyalgia   . Heart murmur    . IBS (irritable bowel syndrome)   . Kidney stones   . Migraine   . Motion sickness    car back seat  . Neuropathy   . Ovarian cyst 2010   bilateral side. R cyst has been removed 2016  . SBO (small bowel obstruction) (HCC)    2010  . 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 Polypharmacy 12/31/2019  . MDD (major depressive disorder) 09/22/2019  . Paresthesia 07/08/2018  . Chronic migraine 07/08/2018    Past Surgical History:  Procedure Laterality Date  . ABDOMINAL SURGERY    . KIDNEY STONE SURGERY    . NASAL SEPTOPLASTY W/ TURBINOPLASTY Bilateral 11/21/2017   Procedure: NASAL SEPTOPLASTY WITH TURBINATE REDUCTION;  Surgeon: Vernie Murders, MD;  Location: [facility];  Service: ENT;  Laterality: Bilateral;  . OVARIAN CYST SURGERY    . SMALL INTESTINE SURGERY     at birth to remove volvulus    Current Outpatient Medications  Medication Sig Dispense Refill  . albuterol (PROVENTIL HFA;VENTOLIN HFA) 108 (90 Base) MCG/ACT inhaler Inhale 2 puffs into the lungs every 6 (six) hours as needed for wheezing or shortness of breath.    . ALPRAZolam (XANAX) 0.25 MG tablet Take 0.25 mg by mouth 2 (two) times daily as needed for anxiety.    Marland Kitchen amphetamine-dextroamphetamine (ADDERALL) 30 MG tablet Take 30 mg by mouth 2 (two) times daily.    Marland Kitchen buPROPion (WELLBUTRIN XL) 300 MG 24 hr tablet Take 300 mg by mouth daily.    . Fremanezumab-vfrm (AJOVY) 225 MG/1.5ML SOAJ Inject 225 mg into the skin every 30 (thirty) days. Inject 225 mg every month 3 pen 4  . HYOSCYAMINE SULFATE PO Take 0.125 mg by mouth daily as needed.    . lamoTRIgine (LAMICTAL) 200 MG tablet Take 200 mg by mouth daily.     Marland Kitchen loxapine (LOXITANE) 25 MG capsule Take 25 mg by mouth daily.    Marland Kitchen lubiprostone (AMITIZA) 24 MCG capsule Take 24 mcg by mouth 2 (two) times daily with a meal.    . montelukast (SINGULAIR) 10 MG tablet  Take 10 mg by mouth at bedtime.    . norethindrone-ethinyl estradiol (CYCLAFEM,ALYACEN) 0.5/0.75/1-35 MG-MCG tablet Take 1 tablet by mouth daily.    . pantoprazole (PROTONIX) 40 MG tablet Take 40 mg by mouth daily.    . polyethylene glycol (MIRALAX / GLYCOLAX) 17 g packet Take 17 g by mouth daily.    Marland Kitchen POTASSIUM PO Take 10 mEq by mouth daily as needed.    . pregabalin (LYRICA) 100 MG capsule Take 100 mg by mouth 2 (two) times daily.    Marland Kitchen PROMETHAZINE HCL PO Take 1 tablet by mouth daily as needed.    . propranolol (INDERAL) 20 MG tablet TAKE  2 TABLETS BY MOUTH TWICE A DAY 360 tablet 1  . propranolol (INDERAL) 40 MG tablet Take 1 tablet (40 mg total) by mouth daily. 30 tablet 1   No current facility-administered medications for this visit.    Allergies as of 07/28/2020 - Review Complete 07/28/2020  Allergen Reaction Noted  . Ceftriaxone Hives 08/27/2018  . Morphine and related Nausea And Vomiting and Rash 03/29/2016    Vitals: BP 112/64 (BP Location: Right Arm, Patient Position: Sitting)   Pulse 72   Ht 4\' 11"  (1.499 m)   Wt 105 lb (47.6 kg)   BMI 21.21 kg/m  Last Weight:  Wt Readings from Last 1 Encounters:  07/28/20 105 lb (47.6 kg)   Last Height:   Ht Readings from Last 1 Encounters:  07/28/20 4\' 11"  (1.499 m)     Physical exam: Exam: Gen: NAD, flat affect, well nourised, well groomed                     CV: RRR, no MRG. No Carotid Bruits. No peripheral edema, warm, nontender Eyes: Conjunctivae clear without exudates or hemorrhage  Neuro: Detailed Neurologic Exam  Speech:    Speech is normal; fluent and spontaneous with normal comprehension.  Cognition:    The patient is oriented to person, place, and time;     recent and remote memory intact;     language fluent;     normal attention, concentration,     fund of knowledge Cranial Nerves:    The pupils are equal, round, and reactive to light. The fundi are flat. Visual fields are full. Extraocular movements are  intact. Trigeminal sensation is intact. The face is symmetric. The palate elevates in the midline. Hearing intact to voice. Voice is normal. Shoulder shrug is normal. The tongue has normal motion without fasciculations.   Coordination:    No dysmetria or ataxia  Gait:    Heel-toe and tandem gait are normal.   Motor Observation:    No asymmetry, no atrophy, and no involuntary movements noted. Tone:    Normal muscle tone.    Posture:    Posture is normal. normal erect    Strength: Poor effort.  Strength is V/V in the upper and lower limbs.      Sensation: intact to LT     Reflex Exam:  DTR's:    Deep tendon reflexes in the upper and lower extremities are normal bilaterally.   Toes:    The toes are downgoing bilaterally.   Clonus:    Clonus is absent.    Assessment/Plan:  34 year old with multiple neurologic complaints: Her fingers twitch a lot both of them at different times. When she is sitting her vision goes blurry both eyes. Her whole body goes numb. Her language and speech difficult to talk. Generalized weakness of muscles.  She is forgetful. Not painful except when she is having a bad day and then the pain will "shoot through my entire body". Symmetric both sides. Randomly, no triggers.  Neuro exam is non focal. B12 was low-normal in the past will test that. Multiple MRIs of the brain in the past unremarkable will check MRI cervical spine w/wo contrast to ensure no demyelinating plaques/MS. Blood work and EMG/NCS. Had labs in the past such as ANA, CK, RPR, vitamin D, tsh but will recheck. Poor effort on exam and flat affect, suspect mood disorder overlay but needs a thorough evaluation for other causes as well. EMG/NCS one arm and one leg.  Orders Placed This Encounter  Procedures  . MR CERVICAL SPINE W WO CONTRAST  . B12 and Folate Panel  . Methylmalonic acid, serum  . Vitamin B1  . Vitamin B6  . ANA w/Reflex  . Sedimentation rate  . Heavy metals, blood  . Vitamin D,  25-hydroxy  . TSH  . CBC  . Comprehensive metabolic panel  . NCV with EMG(electromyography)   No orders of the defined types were placed in this encounter.   Cc: Evelene CroonNiemeyer, [name], MD,  Evelene CroonNiemeyer, [name], MD  Naomie DeanAntonia Dakarai Mcglocklin, MD  [facility] [address] [place], [place] [id number]  Phone [phone] Fax [phone]

## 2020-07-30 ENCOUNTER — Other Ambulatory Visit: Payer: Self-pay | Admitting: Neurology

## 2020-07-30 DIAGNOSIS — E538 Deficiency of other specified B group vitamins: Secondary | ICD-10-CM | POA: Insufficient documentation

## 2020-07-30 MED ORDER — FA-PYRIDOXINE-CYANOCOBALAMIN 2.5-25-2 MG PO TABS: 1.0000 | ORAL_TABLET | Freq: Every day | ORAL | 3 refills | Status: AC

## 2020-08-01 NOTE — Telephone Encounter (Signed)
Let the patient know. I just diagnosed her with B12 deficiency and this may be the cause of her symptoms anyway so we can hold off on the MRI for now please withdraw.

## 2020-08-01 NOTE — Telephone Encounter (Signed)
Medicaid [place] did not approve the MRI. Reason notes that say you did six weeks of neck stretches (Physical therapy, chiropractic treatments, or medically directed home exercise program) in the last six months.   They stated it does not have to be the MD to call it can be a RN it just has to be someone that holds the clinic title. She didn't get me a time line of the deadline just for it to be done as soon as possible. The phone number is [phone] there will be prompts. The tracking number is [id number].

## 2020-08-02 NOTE — Telephone Encounter (Signed)
Patient is aware of the denial.

## 2020-08-03 DIAGNOSIS — F5082 Avoidant/restrictive food intake disorder: Secondary | ICD-10-CM | POA: Diagnosis not present

## 2020-08-03 DIAGNOSIS — F410 Panic disorder [episodic paroxysmal anxiety] without agoraphobia: Secondary | ICD-10-CM | POA: Diagnosis not present

## 2020-08-03 DIAGNOSIS — F4312 Post-traumatic stress disorder, chronic: Secondary | ICD-10-CM | POA: Diagnosis not present

## 2020-08-03 DIAGNOSIS — F411 Generalized anxiety disorder: Secondary | ICD-10-CM | POA: Diagnosis not present

## 2020-08-03 LAB — COMPREHENSIVE METABOLIC PANEL
ALT: 11 IU/L (ref 0–32)
AST: 11 IU/L (ref 0–40)
Albumin/Globulin Ratio: 1.4 (ref 1.2–2.2)
Albumin: 3.9 g/dL (ref 3.8–4.8)
Alkaline Phosphatase: 51 IU/L (ref 44–121)
BUN/Creatinine Ratio: 8 — ABNORMAL LOW (ref 9–23)
BUN: 6 mg/dL (ref 6–20)
Bilirubin Total: 0.4 mg/dL (ref 0.0–1.2)
CO2: 25 mmol/L (ref 20–29)
Calcium: 9.2 mg/dL (ref 8.7–10.2)
Chloride: 101 mmol/L (ref 96–106)
Creatinine, Ser: 0.75 mg/dL (ref 0.57–1.00)
GFR calc Af Amer: 120 mL/min/{1.73_m2} (ref >59)
GFR calc non Af Amer: 104 mL/min/{1.73_m2} (ref >59)
Globulin, Total: 2.8 g/dL (ref 1.5–4.5)
Glucose: 88 mg/dL (ref 65–99)
Potassium: 3.7 mmol/L (ref 3.5–5.2)
Sodium: 139 mmol/L (ref 134–144)
Total Protein: 6.7 g/dL (ref 6.0–8.5)

## 2020-08-03 LAB — CBC
Hematocrit: 39.3 % (ref 34.0–46.6)
Hemoglobin: 12.8 g/dL (ref 11.1–15.9)
MCH: 29.9 pg (ref 26.6–33.0)
MCHC: 32.6 g/dL (ref 31.5–35.7)
MCV: 92 fL (ref 79–97)
Platelets: 355 10*3/uL (ref 150–450)
RBC: 4.28 x10E6/uL (ref 3.77–5.28)
RDW: 12.2 % (ref 11.7–15.4)
WBC: 8 10*3/uL (ref 3.4–10.8)

## 2020-08-03 LAB — METHYLMALONIC ACID, SERUM: Methylmalonic Acid: 1220 nmol/L — ABNORMAL HIGH (ref 0–378)

## 2020-08-03 LAB — B12 AND FOLATE PANEL
Folate: 12.5 ng/mL (ref >3.0)
Vitamin B-12: 183 pg/mL — ABNORMAL LOW (ref 232–1245)

## 2020-08-03 LAB — HEAVY METALS, BLOOD
Arsenic: <1 ug/L — ABNORMAL LOW (ref 2–23)
Lead, Blood: <1 ug/dL (ref 0–4)
Mercury: <1 ug/L (ref 0.0–14.9)

## 2020-08-03 LAB — VITAMIN B1: Thiamine: 136.2 nmol/L (ref 66.5–200.0)

## 2020-08-03 LAB — SEDIMENTATION RATE: Sed Rate: 3 mm/hr (ref 0–32)

## 2020-08-03 LAB — VITAMIN B6: Vitamin B6: 3.9 ug/L (ref 2.0–32.8)

## 2020-08-03 LAB — VITAMIN D 25 HYDROXY (VIT D DEFICIENCY, FRACTURES): Vit D, 25-Hydroxy: 30.5 ng/mL (ref 30.0–100.0)

## 2020-08-03 LAB — ANA W/REFLEX: Anti Nuclear Antibody (ANA): NEGATIVE

## 2020-08-03 LAB — TSH: TSH: 1.65 u[IU]/mL (ref 0.450–4.500)

## 2020-08-31 DIAGNOSIS — F5082 Avoidant/restrictive food intake disorder: Secondary | ICD-10-CM | POA: Diagnosis not present

## 2020-08-31 DIAGNOSIS — F410 Panic disorder [episodic paroxysmal anxiety] without agoraphobia: Secondary | ICD-10-CM | POA: Diagnosis not present

## 2020-08-31 DIAGNOSIS — F4312 Post-traumatic stress disorder, chronic: Secondary | ICD-10-CM | POA: Diagnosis not present

## 2020-08-31 DIAGNOSIS — F411 Generalized anxiety disorder: Secondary | ICD-10-CM | POA: Diagnosis not present

## 2020-08-31 NOTE — Progress Notes (Deleted)
Patient presented with multiple neurologic complaints, finger twitching, blurry vision, her whole body going numb, generalized weakness of muscles.  Work-up revealed B12 deficiency.  She had MRI of the brain in the past, MRI of the cervical spine is pending, she has had extensive serum lab testing.

## 2020-09-01 ENCOUNTER — Encounter: Payer: Self-pay | Admitting: Neurology

## 2020-09-01 ENCOUNTER — Telehealth: Payer: Self-pay | Admitting: Neurology

## 2020-09-01 ENCOUNTER — Encounter: Payer: Medicaid Other | Admitting: Neurology

## 2020-09-01 NOTE — Telephone Encounter (Signed)
Patient no-showed 2 appointments today, nerve conduction studies and EMG. DO NOT RESCHEDULE patient without approval from Dr. Lucia Gaskins  She has no-showed before, angie please review

## 2020-09-13 DIAGNOSIS — F410 Panic disorder [episodic paroxysmal anxiety] without agoraphobia: Secondary | ICD-10-CM | POA: Diagnosis not present

## 2020-09-13 DIAGNOSIS — F4312 Post-traumatic stress disorder, chronic: Secondary | ICD-10-CM | POA: Diagnosis not present

## 2020-09-13 DIAGNOSIS — F411 Generalized anxiety disorder: Secondary | ICD-10-CM | POA: Diagnosis not present

## 2020-09-13 DIAGNOSIS — F5082 Avoidant/restrictive food intake disorder: Secondary | ICD-10-CM | POA: Diagnosis not present

## 2020-09-17 ENCOUNTER — Other Ambulatory Visit: Payer: Self-pay | Admitting: Neurology

## 2020-10-11 DIAGNOSIS — F411 Generalized anxiety disorder: Secondary | ICD-10-CM | POA: Diagnosis not present

## 2020-10-11 DIAGNOSIS — F410 Panic disorder [episodic paroxysmal anxiety] without agoraphobia: Secondary | ICD-10-CM | POA: Diagnosis not present

## 2020-10-11 DIAGNOSIS — F5082 Avoidant/restrictive food intake disorder: Secondary | ICD-10-CM | POA: Diagnosis not present

## 2020-10-11 DIAGNOSIS — F4312 Post-traumatic stress disorder, chronic: Secondary | ICD-10-CM | POA: Diagnosis not present

## 2020-10-25 ENCOUNTER — Other Ambulatory Visit: Payer: Self-pay | Admitting: Neurology

## 2020-11-10 DIAGNOSIS — F5082 Avoidant/restrictive food intake disorder: Secondary | ICD-10-CM | POA: Diagnosis not present

## 2020-11-10 DIAGNOSIS — F410 Panic disorder [episodic paroxysmal anxiety] without agoraphobia: Secondary | ICD-10-CM | POA: Diagnosis not present

## 2020-11-10 DIAGNOSIS — F4312 Post-traumatic stress disorder, chronic: Secondary | ICD-10-CM | POA: Diagnosis not present

## 2020-11-10 DIAGNOSIS — F411 Generalized anxiety disorder: Secondary | ICD-10-CM | POA: Diagnosis not present

## 2020-11-23 DIAGNOSIS — G43909 Migraine, unspecified, not intractable, without status migrainosus: Secondary | ICD-10-CM | POA: Diagnosis not present

## 2020-11-23 DIAGNOSIS — E539 Vitamin B deficiency, unspecified: Secondary | ICD-10-CM | POA: Diagnosis not present

## 2020-11-23 DIAGNOSIS — Z79899 Other long term (current) drug therapy: Secondary | ICD-10-CM | POA: Diagnosis not present

## 2020-11-23 DIAGNOSIS — E559 Vitamin D deficiency, unspecified: Secondary | ICD-10-CM | POA: Diagnosis not present

## 2020-12-02 DIAGNOSIS — Z01419 Encounter for gynecological examination (general) (routine) without abnormal findings: Secondary | ICD-10-CM | POA: Diagnosis not present

## 2020-12-02 DIAGNOSIS — Z124 Encounter for screening for malignant neoplasm of cervix: Secondary | ICD-10-CM | POA: Diagnosis not present

## 2020-12-02 DIAGNOSIS — R399 Unspecified symptoms and signs involving the genitourinary system: Secondary | ICD-10-CM | POA: Diagnosis not present

## 2020-12-02 DIAGNOSIS — E559 Vitamin D deficiency, unspecified: Secondary | ICD-10-CM | POA: Diagnosis not present

## 2020-12-02 DIAGNOSIS — E876 Hypokalemia: Secondary | ICD-10-CM | POA: Diagnosis not present

## 2020-12-08 DIAGNOSIS — F4312 Post-traumatic stress disorder, chronic: Secondary | ICD-10-CM | POA: Diagnosis not present

## 2020-12-08 DIAGNOSIS — F411 Generalized anxiety disorder: Secondary | ICD-10-CM | POA: Diagnosis not present

## 2020-12-08 DIAGNOSIS — F5082 Avoidant/restrictive food intake disorder: Secondary | ICD-10-CM | POA: Diagnosis not present

## 2020-12-08 DIAGNOSIS — F410 Panic disorder [episodic paroxysmal anxiety] without agoraphobia: Secondary | ICD-10-CM | POA: Diagnosis not present

## 2020-12-14 ENCOUNTER — Other Ambulatory Visit: Payer: Self-pay | Admitting: Neurology

## 2020-12-23 ENCOUNTER — Other Ambulatory Visit: Payer: Self-pay | Admitting: Neurology

## 2021-01-25 ENCOUNTER — Other Ambulatory Visit: Payer: Self-pay | Admitting: Neurology

## 2021-01-27 DIAGNOSIS — Z0289 Encounter for other administrative examinations: Secondary | ICD-10-CM | POA: Diagnosis not present

## 2021-01-27 DIAGNOSIS — Z111 Encounter for screening for respiratory tuberculosis: Secondary | ICD-10-CM | POA: Diagnosis not present

## 2021-01-27 DIAGNOSIS — Z6820 Body mass index (BMI) 20.0-20.9, adult: Secondary | ICD-10-CM | POA: Diagnosis not present

## 2021-04-16 ENCOUNTER — Other Ambulatory Visit: Payer: Self-pay | Admitting: Neurology

## 2021-04-17 ENCOUNTER — Other Ambulatory Visit: Payer: Self-pay | Admitting: Neurology

## 2021-04-26 DIAGNOSIS — Z6822 Body mass index (BMI) 22.0-22.9, adult: Secondary | ICD-10-CM | POA: Diagnosis not present

## 2021-04-26 DIAGNOSIS — Z111 Encounter for screening for respiratory tuberculosis: Secondary | ICD-10-CM | POA: Diagnosis not present

## 2021-04-26 DIAGNOSIS — Z0289 Encounter for other administrative examinations: Secondary | ICD-10-CM | POA: Diagnosis not present

## 2021-10-05 ENCOUNTER — Telehealth: Payer: Self-pay | Admitting: Family Medicine

## 2021-10-05 NOTE — Telephone Encounter (Signed)
.. °  Medicaid Managed Care   Unsuccessful Outreach Note  10/05/2021 Name: Charlene Ferguson MRN: [id number] DOB: [birth year]  Referred by: Evelene Croon, MD Reason for referral : High Risk Managed Medicaid (I called the patient today to get her scheduled with the MM Team. Her number was not in order. Unable to leave a message.)   An unsuccessful telephone outreach was attempted today. The patient was referred to the case management team for assistance with care management and care coordination.   Follow Up Plan: The care management team will reach out to the patient again over the next 7 days.    Weston Settle Care Guide, High Risk Medicaid Managed Care Embedded Care Coordination [facility]   [facility] .

## 2021-12-27 ENCOUNTER — Telehealth: Payer: Self-pay | Admitting: Family Medicine

## 2021-12-27 NOTE — Telephone Encounter (Signed)
.. ?  Medicaid Managed Care  ? ?Unsuccessful Outreach Note ? ?12/27/2021 ?Name: Charlene Ferguson MRN: [id number] DOB: [birth year] ? ?Referred by: Evelene Croon, MD ?Reason for referral : High Risk Managed Medicaid (I called the patient today to get her scheduled with the MM Team. She did not answer and there was not a VM.) ? ? ?A second unsuccessful telephone outreach was attempted today. The patient was referred to the case management team for assistance with care management and care coordination.  ? ?Follow Up Plan: The care management team will reach out to the patient again over the next 7-14 days.  ? ? ? ?Weston Settle ?Care Guide, High Risk Medicaid Managed Care ?Embedded Care Coordination ?[place]  [facility]  ? ? ?SIGNATURE  ?

## 2022-01-05 ENCOUNTER — Telehealth: Payer: Self-pay | Admitting: Family Medicine

## 2022-01-05 NOTE — Telephone Encounter (Signed)
.. ?  Medicaid Managed Care  ? ?Unsuccessful Outreach Note ? ?01/05/2022 ?Name: Charlene Ferguson MRN: [id number] DOB: 10-18-1985 ? ?Referred by: Evelene Croon, MD ?Reason for referral : High Risk Managed Medicaid (Third attempt to reach the patient to get her scheduled with the MM Team for Care Management. She did not answer and there was not a VM.) ? ? ?Third unsuccessful telephone outreach was attempted today. The patient was referred to the case management team for assistance with care management and care coordination. The patient's primary care provider has been notified of our unsuccessful attempts to make or maintain contact with the patient. The care management team is pleased to engage with this patient at any time in the future should he/she be interested in assistance from the care management team.  ? ?Follow Up Plan: We have been unable to make contact with the patient for follow up. The care management team is available to follow up with the patient after provider conversation with the patient regarding recommendation for care management engagement and subsequent re-referral to the care management team.  ? ?Weston Settle ?Care Guide, High Risk Medicaid Managed Care ?Embedded Care Coordination ?[facility]  ? ? ?SIGNATURE  ?

## 2022-01-05 NOTE — Telephone Encounter (Signed)
Formatting of this note might be different from the original.  Images from the original note were not included.  ..  Medicaid Managed Care     Unsuccessful Outreach Note    01/05/2022  Name: Krystal Chandler MRN: [id number] DOB: 1986/01/25    Referred by: Evelene Croon, MD  Reason for referral : High Risk Managed Medicaid (Third attempt to reach the patient to get her scheduled with the MM Team for Care Management. She did not answer and there was not a VM.)    Third unsuccessful telephone outreach was attempted today. The patient was referred to the case management team for assistance with care management and care coordination. The patient's primary care provider has been notified of our unsuccessful attempts to make or maintain contact with the patient. The care management team is pleased to engage with this patient at any time in the future should he/she be interested in assistance from the care management team.     Follow Up Plan: We have been unable to make contact with the patient for follow up. The care management team is available to follow up with the patient after provider conversation with the patient regarding recommendation for care management engagement and subsequent re-referral to the care management team.     Weston Settle  Care Guide, High Risk Medicaid Managed Care  Embedded Care Coordination  [facility]  [facility]     SIGNATURE   Electronically signed by Caprice Red at 01/05/2022  1:54 PM EDT

## 2023-02-08 ENCOUNTER — Inpatient Hospital Stay: Admit: 2023-02-08 | Payer: BLUE CROSS/BLUE SHIELD | Primary: Internal Medicine

## 2023-02-08 LAB — LABCORP SPECIMEN COLLECTION
